# Patient Record
Sex: Female | Born: 1960 | Race: White | Hispanic: No | Marital: Married | Smoking: Former smoker
Health system: Southern US, Community
[De-identification: ages and names within clinical notes are randomized; demographics above are authoritative.]

## PROBLEM LIST (undated history)

## (undated) DIAGNOSIS — T7840XA Allergy, unspecified, initial encounter: Secondary | ICD-10-CM

## (undated) DIAGNOSIS — I10 Essential (primary) hypertension: Secondary | ICD-10-CM

## (undated) DIAGNOSIS — M1990 Unspecified osteoarthritis, unspecified site: Secondary | ICD-10-CM

## (undated) DIAGNOSIS — I714 Abdominal aortic aneurysm, without rupture, unspecified: Secondary | ICD-10-CM

## (undated) DIAGNOSIS — M810 Age-related osteoporosis without current pathological fracture: Secondary | ICD-10-CM

## (undated) DIAGNOSIS — I161 Hypertensive emergency: Secondary | ICD-10-CM

## (undated) DIAGNOSIS — O00109 Unspecified tubal pregnancy without intrauterine pregnancy: Secondary | ICD-10-CM

## (undated) DIAGNOSIS — O009 Unspecified ectopic pregnancy without intrauterine pregnancy: Secondary | ICD-10-CM

## (undated) DIAGNOSIS — E785 Hyperlipidemia, unspecified: Secondary | ICD-10-CM

## (undated) DIAGNOSIS — I712 Thoracic aortic aneurysm, without rupture: Secondary | ICD-10-CM

## (undated) HISTORY — PX: BREAST SURGERY: SHX581

## (undated) HISTORY — DX: Abdominal aortic aneurysm, without rupture, unspecified: I71.40

## (undated) HISTORY — DX: Age-related osteoporosis without current pathological fracture: M81.0

## (undated) HISTORY — DX: Hypertensive emergency: I16.1

## (undated) HISTORY — PX: PARTIAL HYSTERECTOMY: SHX80

## (undated) HISTORY — DX: Allergy, unspecified, initial encounter: T78.40XA

## (undated) HISTORY — DX: Essential (primary) hypertension: I10

## (undated) HISTORY — DX: Hyperlipidemia, unspecified: E78.5

## (undated) HISTORY — DX: Abdominal aortic aneurysm, without rupture: I71.4

## (undated) HISTORY — PX: ECTOPIC PREGNANCY SURGERY: SHX613

## (undated) HISTORY — DX: Unspecified osteoarthritis, unspecified site: M19.90

---
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
## 2005-05-30 ENCOUNTER — Emergency Department (HOSPITAL_COMMUNITY): Admission: EM | Admit: 2005-05-30 | Discharge: 2005-05-31 | Payer: Self-pay | Admitting: Emergency Medicine

## 2005-11-22 ENCOUNTER — Other Ambulatory Visit: Admission: RE | Admit: 2005-11-22 | Discharge: 2005-11-22 | Payer: Self-pay | Admitting: Obstetrics and Gynecology

## 2005-12-06 ENCOUNTER — Encounter: Admission: RE | Admit: 2005-12-06 | Discharge: 2005-12-06 | Payer: Self-pay | Admitting: Obstetrics and Gynecology

## 2010-03-28 ENCOUNTER — Encounter: Admission: RE | Admit: 2010-03-28 | Discharge: 2010-03-28 | Payer: Self-pay | Admitting: Obstetrics and Gynecology

## 2014-05-13 ENCOUNTER — Emergency Department (HOSPITAL_COMMUNITY)
Admission: EM | Admit: 2014-05-13 | Discharge: 2014-05-14 | Disposition: A | Discharge status: Home / Self Care | Payer: Managed Care, Other (non HMO) | Attending: Emergency Medicine | Admitting: Emergency Medicine

## 2014-05-13 ENCOUNTER — Encounter (HOSPITAL_COMMUNITY): Payer: Self-pay | Admitting: Emergency Medicine

## 2014-05-13 DIAGNOSIS — M79609 Pain in unspecified limb: Secondary | ICD-10-CM | POA: Diagnosis present

## 2014-05-13 DIAGNOSIS — F17200 Nicotine dependence, unspecified, uncomplicated: Secondary | ICD-10-CM | POA: Diagnosis not present

## 2014-05-13 DIAGNOSIS — Z880 Allergy status to penicillin: Secondary | ICD-10-CM | POA: Insufficient documentation

## 2014-05-13 DIAGNOSIS — M542 Cervicalgia: Secondary | ICD-10-CM | POA: Insufficient documentation

## 2014-05-13 DIAGNOSIS — M25512 Pain in left shoulder: Secondary | ICD-10-CM

## 2014-05-13 DIAGNOSIS — M25519 Pain in unspecified shoulder: Secondary | ICD-10-CM | POA: Insufficient documentation

## 2014-05-13 HISTORY — DX: Unspecified ectopic pregnancy without intrauterine pregnancy: O00.90

## 2014-05-13 LAB — CBC
HEMATOCRIT: 40.1 % (ref 36.0–46.0)
HEMOGLOBIN: 14.3 g/dL (ref 12.0–15.0)
MCH: 31 pg (ref 26.0–34.0)
MCHC: 35.7 g/dL (ref 30.0–36.0)
MCV: 86.8 fL (ref 78.0–100.0)
Platelets: 256 10*3/uL (ref 150–400)
RBC: 4.62 MIL/uL (ref 3.87–5.11)
RDW: 13 % (ref 11.5–15.5)
WBC: 13.2 10*3/uL — AB (ref 4.0–10.5)

## 2014-05-13 LAB — BASIC METABOLIC PANEL
ANION GAP: 13 (ref 5–15)
BUN: 19 mg/dL (ref 6–23)
CALCIUM: 9.5 mg/dL (ref 8.4–10.5)
CO2: 24 meq/L (ref 19–32)
CREATININE: 1.7 mg/dL — AB (ref 0.50–1.10)
Chloride: 102 mEq/L (ref 96–112)
GFR calc Af Amer: 38 mL/min — ABNORMAL LOW (ref >90)
GFR calc non Af Amer: 33 mL/min — ABNORMAL LOW (ref >90)
GLUCOSE: 109 mg/dL — AB (ref 70–99)
Potassium: 4.1 mEq/L (ref 3.7–5.3)
Sodium: 139 mEq/L (ref 137–147)

## 2014-05-13 LAB — I-STAT TROPONIN, ED: TROPONIN I, POC: 0 ng/mL (ref 0.00–0.08)

## 2014-05-13 MED ORDER — KETOROLAC TROMETHAMINE 60 MG/2ML IM SOLN
60.0000 mg | Freq: Once | INTRAMUSCULAR | Status: AC
  Administered 2014-05-13: 60 mg via INTRAMUSCULAR
  Filled 2014-05-13: qty 2

## 2014-05-13 MED ORDER — OXYCODONE-ACETAMINOPHEN 5-325 MG PO TABS
1.0000 | ORAL_TABLET | Freq: Once | ORAL | Status: AC
  Administered 2014-05-13: 1 via ORAL
  Filled 2014-05-13: qty 1

## 2014-05-13 MED ORDER — HYDROMORPHONE HCL PF 1 MG/ML IJ SOLN
0.5000 mg | Freq: Once | INTRAMUSCULAR | Status: AC
Start: 2014-05-13 — End: 2014-05-13
  Administered 2014-05-13: 0.5 mg via INTRAMUSCULAR
  Filled 2014-05-13: qty 1

## 2014-05-13 NOTE — ED Provider Notes (Signed)
CSN: [id number]     Arrival date & time 05/13/14  2152 History   First MD Initiated Contact with Patient 05/13/14 2258     Chief Complaint  Patient presents with  . Arm Pain  . Neck Pain     (Consider location/radiation/quality/duration/timing/severity/associated sxs/prior Treatment) Patient is a 53 y.o. female presenting with arm pain and neck pain. The history is provided by the patient.  Arm Pain This is a new problem. Associated symptoms include chest pain. Pertinent negatives include no abdominal pain, no headaches and no shortness of breath.  Neck Pain Associated symptoms: chest pain   Associated symptoms: no fever, no headaches, no numbness and no weakness    patient woke with left shoulder pain this morning. It is from her shoulder down to her whole upper trunk. She states the arm feels somewhat numb. She has enough pain that it is difficult to move. No fevers. No trauma. She states the pain starts from her neck and works its way down. There is some slight radiation to her chest. She states that she had a ceiling yesterday. She's not had pain like this before. No shortness of breath. Some mild nausea due to the pain.  Past Medical History  Diagnosis Date  . Ectopic pregnancy    Past Surgical History  Procedure Laterality Date  . Breast surgery    . Partial hysterectomy     History reviewed. No pertinent family history. History  Substance Use Topics  . Smoking status: Current Every Day Smoker  . Smokeless tobacco: Not on file  . Alcohol Use: No   OB History   Grav Para Term Preterm Abortions TAB SAB Ect Mult Living                 Review of Systems  Constitutional: Negative for fever, activity change and appetite change.  Eyes: Negative for pain.  Respiratory: Negative for chest tightness and shortness of breath.   Cardiovascular: Positive for chest pain. Negative for leg swelling.  Gastrointestinal: Negative for nausea, vomiting, abdominal pain and diarrhea.   Genitourinary: Negative for flank pain.  Musculoskeletal: Positive for neck pain. Negative for back pain, joint swelling, myalgias and neck stiffness.  Skin: Negative for rash and wound.  Neurological: Negative for weakness, numbness and headaches.  Psychiatric/Behavioral: Negative for behavioral problems.      Allergies  Penicillins  Home Medications   Prior to Admission medications   Medication Sig Start Date End Date Taking? Authorizing Provider  aspirin-acetaminophen-caffeine (EXCEDRIN MIGRAINE) [phone] MG per tablet Take 1 tablet by mouth every 6 (six) hours as needed for headache.   Yes Historical Provider, MD  ibuprofen (ADVIL,MOTRIN) 600 MG tablet Take 1 tablet (600 mg total) by mouth every 6 (six) hours as needed. 05/14/14   Juliet Rude. Chantele Corado, MD  oxyCODONE-acetaminophen (PERCOCET/ROXICET) 5-325 MG per tablet Take 1-2 tablets by mouth every 6 (six) hours as needed. 05/14/14   Juliet Rude. Dajon Lazar, MD   BP 136/72  Pulse 58  Temp(Src) 98.4 F (36.9 C) (Oral)  Resp 19  Ht  (1.676 m)  Wt 131 lb (59.421 kg)  BMI 21.15 kg/m2  SpO2 99% Physical Exam  Constitutional: She is oriented to person, place, and time. She appears well-developed and well-nourished.  HENT:  Head: Normocephalic.  Cardiovascular: Normal rate and regular rhythm.   Pulmonary/Chest: Effort normal and breath sounds normal.  Abdominal: Soft.  Musculoskeletal: She exhibits tenderness.  Moderate tenderness to left shoulder. No erythema. Pain with movement both active and  passive shoulder. Some pain in the shoulder with movement of the elbow. Strong radial pulse. Patient states paresthesias over her entire hand. Sensation is otherwise intact. Good radial median and ulnar strength. No tenderness of her neck.  Neurological: She is alert and oriented to person, place, and time.  Skin: Skin is warm. No rash noted.    ED Course  Procedures (including critical care time) Labs Review Labs Reviewed  CBC -  Abnormal; Notable for the following:    WBC 13.2 (*)    All other components within normal limits  BASIC METABOLIC PANEL - Abnormal; Notable for the following:    Glucose, Bld 109 (*)    Creatinine, Ser 1.70 (*)    GFR calc non Af Amer 33 (*)    GFR calc Af Amer 38 (*)    All other components within normal limits  I-STAT TROPOININ, ED    Imaging Review Dg Shoulder Left  05/14/2014   CLINICAL DATA:  Left arm pain starting this morning. Arm is in a man heavy to lift. No injury.  EXAM: LEFT SHOULDER - 2+ VIEW  COMPARISON:  None.  FINDINGS: There is no evidence of fracture or dislocation. There is no evidence of arthropathy or other focal bone abnormality. Soft tissues are unremarkable.  IMPRESSION: Negative.   Electronically Signed   By: Burman Nieves M.D.   On: 05/14/2014 00:57     EKG Interpretation   Date/Time:  Friday May 13 2014 22:00:55 EDT Ventricular Rate:  73 PR Interval:  184 QRS Duration: 80 QT Interval:  396 QTC Calculation: 436 R Axis:   16 Text Interpretation:  Normal sinus rhythm Low voltage QRS Cannot rule out  Anteroseptal infarct , age undetermined Abnormal ECG Confirmed by  Rubin Payor  MD, Jersie Beel [phone]) on 05/14/2014 12:01:03 AM      MDM   Final diagnoses:  Shoulder pain, acute, left    Patient pain in left shoulder and upper extremity. Good pulses. No rash. Did recently in her ceiling and this could be related to that. Feels somewhat better after treatment. Does not have risk factors for septic joint. Will discharge home and will follow with ortho as needed    Juliet Rude. Rubin Payor, MD 05/14/14 [phone]

## 2014-05-13 NOTE — ED Notes (Signed)
Pt here with left arm pain that started this morning and has progressively gotten worse.  Pt sts pain got worse at 6 pm.  Pain starts in left arm and radiates into neck.  Pt denies neck being stiff and fevers at home.  Pt reports some nausea with pain.  Denies any shortness of breath, weakness, lightheadedness and confusion.  Pt sts arm currently feels numb and heavy to lift.  Able to feel this RN touch hand.  Pain currently 8/10.

## 2014-05-14 ENCOUNTER — Emergency Department (HOSPITAL_COMMUNITY): Payer: Managed Care, Other (non HMO)

## 2014-05-14 MED ORDER — IBUPROFEN 600 MG PO TABS: 600.0000 mg | ORAL_TABLET | Freq: Four times a day (QID) | ORAL | Status: DC | PRN

## 2014-05-14 MED ORDER — OXYCODONE-ACETAMINOPHEN 5-325 MG PO TABS: 1.0000 | ORAL_TABLET | Freq: Four times a day (QID) | ORAL | Status: DC | PRN

## 2014-05-14 NOTE — Discharge Instructions (Signed)

## 2014-05-14 NOTE — ED Notes (Signed)
Applied to left arm

## 2019-01-18 ENCOUNTER — Inpatient Hospital Stay (HOSPITAL_COMMUNITY)
Admission: EM | Admit: 2019-01-18 | Discharge: 2019-01-25 | DRG: 300 | Disposition: A | Discharge status: Home / Self Care | Payer: 59 | Attending: Vascular Surgery | Admitting: Vascular Surgery

## 2019-01-18 ENCOUNTER — Encounter (HOSPITAL_COMMUNITY): Payer: Self-pay | Admitting: Emergency Medicine

## 2019-01-18 ENCOUNTER — Emergency Department (HOSPITAL_COMMUNITY): Payer: 59

## 2019-01-18 ENCOUNTER — Other Ambulatory Visit: Payer: Self-pay

## 2019-01-18 DIAGNOSIS — I7103 Dissection of thoracoabdominal aorta: Principal | ICD-10-CM | POA: Diagnosis present

## 2019-01-18 DIAGNOSIS — R7989 Other specified abnormal findings of blood chemistry: Secondary | ICD-10-CM | POA: Diagnosis present

## 2019-01-18 DIAGNOSIS — I161 Hypertensive emergency: Secondary | ICD-10-CM | POA: Diagnosis present

## 2019-01-18 DIAGNOSIS — R0781 Pleurodynia: Secondary | ICD-10-CM

## 2019-01-18 DIAGNOSIS — N179 Acute kidney failure, unspecified: Secondary | ICD-10-CM | POA: Diagnosis present

## 2019-01-18 DIAGNOSIS — F17210 Nicotine dependence, cigarettes, uncomplicated: Secondary | ICD-10-CM | POA: Diagnosis present

## 2019-01-18 DIAGNOSIS — I71019 Dissection of thoracic aorta, unspecified: Secondary | ICD-10-CM

## 2019-01-18 DIAGNOSIS — Z20828 Contact with and (suspected) exposure to other viral communicable diseases: Secondary | ICD-10-CM | POA: Diagnosis present

## 2019-01-18 DIAGNOSIS — I701 Atherosclerosis of renal artery: Secondary | ICD-10-CM | POA: Diagnosis present

## 2019-01-18 DIAGNOSIS — I7101 Dissection of thoracic aorta: Secondary | ICD-10-CM | POA: Diagnosis not present

## 2019-01-18 DIAGNOSIS — D72829 Elevated white blood cell count, unspecified: Secondary | ICD-10-CM | POA: Diagnosis present

## 2019-01-18 DIAGNOSIS — N183 Chronic kidney disease, stage 3 (moderate): Secondary | ICD-10-CM | POA: Diagnosis present

## 2019-01-18 DIAGNOSIS — N1339 Other hydronephrosis: Secondary | ICD-10-CM | POA: Diagnosis present

## 2019-01-18 DIAGNOSIS — Z23 Encounter for immunization: Secondary | ICD-10-CM

## 2019-01-18 DIAGNOSIS — I129 Hypertensive chronic kidney disease with stage 1 through stage 4 chronic kidney disease, or unspecified chronic kidney disease: Secondary | ICD-10-CM | POA: Diagnosis present

## 2019-01-18 DIAGNOSIS — T402X5A Adverse effect of other opioids, initial encounter: Secondary | ICD-10-CM | POA: Diagnosis present

## 2019-01-18 DIAGNOSIS — L299 Pruritus, unspecified: Secondary | ICD-10-CM | POA: Diagnosis not present

## 2019-01-18 DIAGNOSIS — R040 Epistaxis: Secondary | ICD-10-CM | POA: Diagnosis not present

## 2019-01-18 LAB — CBC WITH DIFFERENTIAL/PLATELET
Abs Immature Granulocytes: 0.05 10*3/uL (ref 0.00–0.07)
Basophils Absolute: 0.1 10*3/uL (ref 0.0–0.1)
Basophils Relative: 1 %
Eosinophils Absolute: 0.3 10*3/uL (ref 0.0–0.5)
Eosinophils Relative: 2 %
HCT: 39.1 % (ref 36.0–46.0)
Hemoglobin: 13.4 g/dL (ref 12.0–15.0)
Immature Granulocytes: 0 %
Lymphocytes Relative: 35 %
Lymphs Abs: 4.1 10*3/uL — ABNORMAL HIGH (ref 0.7–4.0)
MCH: 30 pg (ref 26.0–34.0)
MCHC: 34.3 g/dL (ref 30.0–36.0)
MCV: 87.7 fL (ref 80.0–100.0)
Monocytes Absolute: 1.1 10*3/uL — ABNORMAL HIGH (ref 0.1–1.0)
Monocytes Relative: 10 %
Neutro Abs: 6.1 10*3/uL (ref 1.7–7.7)
Neutrophils Relative %: 52 %
Platelets: 241 10*3/uL (ref 150–400)
RBC: 4.46 MIL/uL (ref 3.87–5.11)
RDW: 12.5 % (ref 11.5–15.5)
WBC: 11.6 10*3/uL — ABNORMAL HIGH (ref 4.0–10.5)
nRBC: 0 % (ref 0.0–0.2)

## 2019-01-18 LAB — I-STAT CREATININE, ED: Creatinine, Ser: 1.8 mg/dL — ABNORMAL HIGH (ref 0.44–1.00)

## 2019-01-18 MED ORDER — LABETALOL HCL 5 MG/ML IV SOLN
20.0000 mg | Freq: Once | INTRAVENOUS | Status: AC
  Administered 2019-01-18: 20 mg via INTRAVENOUS
  Filled 2019-01-18: qty 4

## 2019-01-18 MED ORDER — FENTANYL CITRATE (PF) 100 MCG/2ML IJ SOLN
50.0000 ug | Freq: Once | INTRAMUSCULAR | Status: AC
  Administered 2019-01-18: 50 ug via INTRAVENOUS
  Filled 2019-01-18: qty 2

## 2019-01-18 NOTE — ED Provider Notes (Signed)
[facility] Provider Note   CSN: [id number] Arrival date & time: 01/18/19  2323    History   Chief Complaint Chief Complaint  Patient presents with  . Chest Pain  . Back Pain  . Hypertension    HPI Chestine SporeKathryn Dessert is a 58 y.o. female.     Level 5 caveat for acuity of condition.  Patient presents from home with sudden onset mid back pain between her scapula that radiates down her back.  She has not had this pain in the past.  There is not been any falls or trauma.  States this pain came on while she was resting at home.  She mowed her lawn today without difficulty.  When EMS arrived she was complaining some chest pain and was found to be hypertensive at 190 systolic with no history of the same.  She denies any radiation of the pain to her legs.  There is no numbness or tingling.  No bowel or bladder incontinence.  No fever or vomiting.  Complains of shortness of breath and feels like she cannot get a big breath in.  There is been no cough or fever.  There is been no leg pain or leg swelling.  No history of any kind of blood clot.  She received fentanyl by EMS with partial relief of her pain.  The history is provided by the patient and the EMS personnel. The history is limited by the condition of the patient.  Chest Pain  Associated symptoms: back pain and shortness of breath   Associated symptoms: no abdominal pain, no dizziness, no headache, no nausea, no vomiting and no weakness   Back Pain  Associated symptoms: chest pain   Associated symptoms: no abdominal pain, no dysuria, no headaches and no weakness   Hypertension  Associated symptoms include chest pain and shortness of breath. Pertinent negatives include no abdominal pain and no headaches.    Past Medical History:  Diagnosis Date  . Ectopic pregnancy     There are no active problems to display for this patient.   Past Surgical History:  Procedure Laterality Date  . BREAST  SURGERY    . PARTIAL HYSTERECTOMY       OB History   No obstetric history on file.      Home Medications    Prior to Admission medications   Medication Sig Start Date End Date Taking? Authorizing Provider  aspirin-acetaminophen-caffeine (EXCEDRIN MIGRAINE) [phone] MG per tablet Take 1 tablet by mouth every 6 (six) hours as needed for headache.    [provider]  ibuprofen (ADVIL,MOTRIN) 600 MG tablet Take 1 tablet (600 mg total) by mouth every 6 (six) hours as needed. 05/14/14   Benjiman CorePickering, Nathan, MD  oxyCODONE-acetaminophen (PERCOCET/ROXICET) 5-325 MG per tablet Take 1-2 tablets by mouth every 6 (six) hours as needed. 05/14/14   Benjiman CorePickering, Nathan, MD    Family History No family history on file.  Social History Social History   Tobacco Use  . Smoking status: Current Every Day Smoker  Substance Use Topics  . Alcohol use: No  . Drug use: Not on file     Allergies   Penicillins   Review of Systems Review of Systems  HENT: Negative for congestion.   Eyes: Negative for visual disturbance.  Respiratory: Positive for chest tightness and shortness of breath.   Cardiovascular: Positive for chest pain.  Gastrointestinal: Negative for abdominal pain, nausea and vomiting.  Genitourinary: Negative for dysuria and hematuria.  Musculoskeletal:  Positive for back pain.  Skin: Negative for rash.  Neurological: Negative for dizziness, weakness and headaches.    all other systems are negative except as noted in the HPI and PMH.    Physical Exam Updated Vital Signs BP (!) 153/91   Pulse 64   Resp 16   Ht  (1.676 m)   Wt 63.5 kg   SpO2 93%   BMI 22.60 kg/m   Physical Exam Vitals signs and nursing note reviewed.  Constitutional:      General: She is not in acute distress.    Appearance: She is well-developed.     Comments: Uncomfortable  HENT:     Head: Normocephalic and atraumatic.     Mouth/Throat:     Pharynx: No oropharyngeal exudate.  Eyes:      Conjunctiva/sclera: Conjunctivae normal.     Pupils: Pupils are equal, round, and reactive to light.  Neck:     Musculoskeletal: Normal range of motion and neck supple.     Comments: No meningismus. Cardiovascular:     Rate and Rhythm: Normal rate and regular rhythm.     Heart sounds: Normal heart sounds. No murmur.     Comments: Equal radial pulses bilaterally, equal grip strengths  Equal DP and PT pulses Pulmonary:     Effort: Pulmonary effort is normal. No respiratory distress.     Breath sounds: Normal breath sounds.  Abdominal:     Palpations: Abdomen is soft.     Tenderness: There is no abdominal tenderness. There is no guarding or rebound.  Musculoskeletal: Normal range of motion.        General: Tenderness present.     Comments: Paraspinal thoracic and lumbar tenderness without midline tenderness.  Equal lower extremity strength , Great toe extension intact bilaterally, ankle flexion extension intact bilaterally.  Skin:    General: Skin is warm.     Capillary Refill: Capillary refill takes less than 2 seconds.  Neurological:     General: No focal deficit present.     Mental Status: She is alert and oriented to person, place, and time. Mental status is at baseline.     Cranial Nerves: No cranial nerve deficit.     Motor: No abnormal muscle tone.     Coordination: Coordination normal.     Comments:  5/5 strength throughout. CN 2-12 intact.Equal grip strength.   Psychiatric:        Behavior: Behavior normal.      ED Treatments / Results  Labs (all labs ordered are listed, but only abnormal results are displayed) Labs Reviewed  CBC WITH DIFFERENTIAL/PLATELET - Abnormal; Notable for the following components:      Result Value   WBC 11.6 (*)    Lymphs Abs 4.1 (*)    Monocytes Absolute 1.1 (*)    All other components within normal limits  COMPREHENSIVE METABOLIC PANEL - Abnormal; Notable for the following components:   CO2 18 (*)    Glucose, Bld 141 (*)    BUN 25  (*)    Creatinine, Ser 1.94 (*)    AST 42 (*)    GFR calc non Af Amer 28 (*)    GFR calc Af Amer 32 (*)    All other components within normal limits  TROPONIN I - Abnormal; Notable for the following components:   Troponin I 0.03 (*)    All other components within normal limits  D-DIMER, QUANTITATIVE (NOT AT [facility]) - Abnormal; Notable for the following components:  D-Dimer, Quant >20.00 (*)    All other components within normal limits  I-STAT CREATININE, ED - Abnormal; Notable for the following components:   Creatinine, Ser 1.80 (*)    All other components within normal limits  SARS CORONAVIRUS 2 (HOSPITAL ORDER, PERFORMED IN [facility])  SURGICAL PCR SCREEN  BRAIN NATRIURETIC PEPTIDE  HIV ANTIBODY (ROUTINE TESTING W REFLEX)  CBC  COMPREHENSIVE METABOLIC PANEL  HEMOGLOBIN A1C    EKG EKG Interpretation  Date/Time:  Monday Jan 18 2019 23:33:34 EDT Ventricular Rate:  87 PR Interval:    QRS Duration: 98 QT Interval:  379 QTC Calculation: 456 R Axis:   3 Text Interpretation:  Sinus rhythm Probable anterior infarct, age indeterminate Baseline wander in lead(s) II III aVR aVF V2 V3 V4 V5 V6 Interpretation limited secondary to artifact No significant change was found Confirmed by Glynn Octave [phone]) on 01/18/2019 11:44:47 PM   Radiology Dg Chest Portable 1 View  Result Date: 01/18/2019 CLINICAL DATA:  Chest pain and shortness of breath EXAM: PORTABLE CHEST 1 VIEW COMPARISON:  05/30/2005 FINDINGS: The heart size and mediastinal contours are within normal limits. Both lungs are clear. The visualized skeletal structures are unremarkable. IMPRESSION: No active disease. Electronically Signed   By: Alcide Clever M.D.   On: 01/18/2019 23:41   Ct Angio Chest/abd/pel For Dissection W And/or Wo Contrast  Result Date: 01/19/2019 CLINICAL DATA:  Chest pain, back pain, shortness of breath. EXAM: CT ANGIOGRAPHY CHEST, ABDOMEN AND PELVIS TECHNIQUE: Multidetector CT imaging through  the chest, abdomen and pelvis was performed using the standard protocol during bolus administration of intravenous contrast. Multiplanar reconstructed images and MIPs were obtained and reviewed to evaluate the vascular anatomy. CONTRAST:  OMNIPAQUE IOHEXOL 350 MG/ML SOLN COMPARISON:  None. FINDINGS: CTA CHEST FINDINGS Cardiovascular: There is a type B aortic dissection beginning just beyond the origin of the left subclavian artery. The false lumen within the chest is predominantly thrombosed. Great vessels are patent and uninvolved. Aortic atherosclerosis. Ascending thoracic aorta normal caliber and uninvolved. Heart is normal size. Mediastinum/Nodes: No mediastinal, hilar, or axillary adenopathy. Lungs/Pleura: Lungs are clear. No focal airspace opacities or suspicious nodules. No effusions. Musculoskeletal: Chest wall soft tissues are unremarkable. No acute bony abnormality. Review of the MIP images confirms the above findings. CTA ABDOMEN AND PELVIS FINDINGS VASCULAR Aorta: The dissection continues through the abdominal aorta with contrast opacification noted in the false lumen within the abdomen. The celiac artery and superior mesenteric artery arise from the true lumen. The inferior mesenteric artery arises from the false lumen. All are patent. Both renal arteries appear to arise from the true lumen. 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 Celiac: Patent, arising from the true lumen. SMA: Patent, arising from the true lumen. Renals: Patent, arising from the true lumen. IMA: Patent, arising from the false lumen. Inflow: Dissection continues into both common iliac arteries, terminating on the left above the common iliac bifurcation. This continues on the right into the proximal right external iliac artery. Iliofemoral vessels are patent. There is abnormal soft tissue surrounding the distal left common iliac artery and proximal left external and internal iliac arteries. I see no  contrast extravasation, but cannot exclude leak. Veins: No obvious venous abnormality within the limitations of this arterial phase study. Review of the MIP images confirms the above findings. NON-VASCULAR Hepatobiliary: No focal hepatic abnormality. Gallbladder unremarkable. Pancreas: No focal abnormality or ductal dilatation. Spleen: No focal abnormality.  Normal size. Adrenals/Urinary Tract:  There is left hydronephrosis and delayed excretion of contrast from the left kidney. The left ureter is dilated to the pelvic brim and the abnormal soft tissue surrounding the left iliac vessels. No visible stones. Adrenal glands and urinary bladder unremarkable. Right kidney unremarkable. Stomach/Bowel: Stomach, large and small bowel grossly unremarkable. Lymphatic: No adenopathy Reproductive: Uterus and adnexa unremarkable.  No mass. Other: No free fluid or free air. Musculoskeletal: No acute bony abnormality. Review of the MIP images confirms the above findings. IMPRESSION: Type B thoracoabdominal aortic dissection. False lumen is thrombosed in the chest, patent in the abdomen, and extends into the right external iliac artery and left common iliac artery. Great vessels are patent and uninvolved. Abnormal soft tissue surrounds the left iliac vessels and causes obstruction of the left kidney. Cannot exclude leak from the iliac vessel although no active extravasation of contrast is seen. As mentioned above, left hydronephrosis and delayed excretion of contrast from the left kidney. Critical Value/emergent results were called by telephone at the time of interpretation on 01/19/2019 at 12:40 am to Dr. Glynn Octave , who verbally acknowledged these results. Electronically Signed   By: Charlett Nose M.D.   On: 01/19/2019 00:40    Procedures Procedures (including critical care time)  Medications Ordered in ED Medications  fentaNYL (SUBLIMAZE) injection 50 mcg (has no administration in time range)  labetalol (NORMODYNE)  injection 20 mg (has no administration in time range)     Initial Impression / Assessment and Plan / ED Course  I have reviewed the triage vital signs and the nursing notes.  Pertinent labs & imaging results that were available during my care of the patient were reviewed by me and considered in my medical decision making (see chart for details).       Sudden onset of mid back pain and chest pain with shortness of breath and concern for aortic dissection.  She has equal radial pulses and grip strengths.  Equal peripheral pulses in her feet.  Creatinine 1.8 which is near her baseline.  She seems appropriate for low-dose IV contrast.  Benefits of IV contrast in diagnosing her dissection outweigh risks of post contrast nephropathy.  CT confirms type B aortic dissection involving iliac artery as well as occlusion of left kidney.  Patient started on IV labetalol and IV esmolol for aggressive blood pressure control.  Discussed with Dr. Darrick Penna of vascular surgery who will evaluate. Discussed with Dr. Dorris Fetch of cardiothoracic surgery who defers to Dr. Darrick Penna.  Patient continued on IV labetalol and IV esmolol as well as IV nitroglycerin for blood pressure control.  Discussed target systolic blood pressure of 140 with Dr. Darrick Penna.  Patient's chest pain and back pain have improved.  She is maintaining her airway mental status is stable. Admission discussed with Dr. Darrick Penna as well as ICU team.  Patient's husband updated by phone at her request.   CRITICAL CARE Performed by: Glynn Octave Total critical care time: 60 minutes Critical care time was exclusive of separately billable procedures and treating other patients. Critical care was necessary to treat or prevent imminent or life-threatening deterioration. Critical care was time spent personally by me on the following activities: development of treatment plan with patient and/or surrogate as well as nursing, discussions with consultants,  evaluation of patient's response to treatment, examination of patient, obtaining history from patient or surrogate, ordering and performing treatments and interventions, ordering and review of laboratory studies, ordering and review of radiographic studies, pulse oximetry and re-evaluation of patient's condition.  Final Clinical Impressions(s) / ED Diagnoses   Final diagnoses:  Aortic dissection, thoracic [facility])  Hypertensive emergency    ED Discharge Orders    None       Taha Dimond, Jeannett Senior, MD 01/19/19 [phone]

## 2019-01-18 NOTE — ED Triage Notes (Signed)
BIB EMS from home. C/o back pain, mid to lower, sudden onset approx 2200 tonight. Also c/o CP, SOB. 99% RA with clear LS, but tachypnic. States unable to get a deep breath, though doing just that upon presentation to ED. Hypertensive throughout. Given fentanyl.

## 2019-01-18 NOTE — ED Notes (Signed)
Patient transported to CT, escorted by this RN. °

## 2019-01-19 ENCOUNTER — Inpatient Hospital Stay (HOSPITAL_COMMUNITY): Payer: 59

## 2019-01-19 ENCOUNTER — Emergency Department (HOSPITAL_COMMUNITY): Payer: 59

## 2019-01-19 DIAGNOSIS — I129 Hypertensive chronic kidney disease with stage 1 through stage 4 chronic kidney disease, or unspecified chronic kidney disease: Secondary | ICD-10-CM | POA: Diagnosis present

## 2019-01-19 DIAGNOSIS — N183 Chronic kidney disease, stage 3 (moderate): Secondary | ICD-10-CM | POA: Diagnosis not present

## 2019-01-19 DIAGNOSIS — I7101 Dissection of thoracic aorta: Secondary | ICD-10-CM | POA: Diagnosis present

## 2019-01-19 DIAGNOSIS — N179 Acute kidney failure, unspecified: Secondary | ICD-10-CM | POA: Diagnosis present

## 2019-01-19 DIAGNOSIS — I161 Hypertensive emergency: Secondary | ICD-10-CM

## 2019-01-19 DIAGNOSIS — I7102 Dissection of abdominal aorta: Secondary | ICD-10-CM

## 2019-01-19 DIAGNOSIS — T402X5A Adverse effect of other opioids, initial encounter: Secondary | ICD-10-CM | POA: Diagnosis present

## 2019-01-19 DIAGNOSIS — Z01810 Encounter for preprocedural cardiovascular examination: Secondary | ICD-10-CM

## 2019-01-19 DIAGNOSIS — I701 Atherosclerosis of renal artery: Secondary | ICD-10-CM | POA: Diagnosis present

## 2019-01-19 DIAGNOSIS — Z23 Encounter for immunization: Secondary | ICD-10-CM | POA: Diagnosis not present

## 2019-01-19 DIAGNOSIS — I71019 Dissection of thoracic aorta, unspecified: Secondary | ICD-10-CM | POA: Diagnosis present

## 2019-01-19 DIAGNOSIS — N1339 Other hydronephrosis: Secondary | ICD-10-CM | POA: Diagnosis present

## 2019-01-19 DIAGNOSIS — R9431 Abnormal electrocardiogram [ECG] [EKG]: Secondary | ICD-10-CM | POA: Diagnosis not present

## 2019-01-19 DIAGNOSIS — D72829 Elevated white blood cell count, unspecified: Secondary | ICD-10-CM | POA: Diagnosis present

## 2019-01-19 DIAGNOSIS — F17210 Nicotine dependence, cigarettes, uncomplicated: Secondary | ICD-10-CM | POA: Diagnosis present

## 2019-01-19 DIAGNOSIS — Z20828 Contact with and (suspected) exposure to other viral communicable diseases: Secondary | ICD-10-CM | POA: Diagnosis present

## 2019-01-19 DIAGNOSIS — R079 Chest pain, unspecified: Secondary | ICD-10-CM | POA: Diagnosis not present

## 2019-01-19 DIAGNOSIS — R040 Epistaxis: Secondary | ICD-10-CM | POA: Diagnosis not present

## 2019-01-19 DIAGNOSIS — I7103 Dissection of thoracoabdominal aorta: Secondary | ICD-10-CM | POA: Diagnosis present

## 2019-01-19 DIAGNOSIS — L299 Pruritus, unspecified: Secondary | ICD-10-CM | POA: Diagnosis not present

## 2019-01-19 DIAGNOSIS — R7989 Other specified abnormal findings of blood chemistry: Secondary | ICD-10-CM | POA: Diagnosis present

## 2019-01-19 LAB — BASIC METABOLIC PANEL
Anion gap: 7 (ref 5–15)
BUN: 28 mg/dL — ABNORMAL HIGH (ref 6–20)
CO2: 21 mmol/L — ABNORMAL LOW (ref 22–32)
Calcium: 8.6 mg/dL — ABNORMAL LOW (ref 8.9–10.3)
Chloride: 107 mmol/L (ref 98–111)
Creatinine, Ser: 2.14 mg/dL — ABNORMAL HIGH (ref 0.44–1.00)
GFR calc Af Amer: 28 mL/min — ABNORMAL LOW (ref >60)
GFR calc non Af Amer: 25 mL/min — ABNORMAL LOW (ref >60)
Glucose, Bld: 139 mg/dL — ABNORMAL HIGH (ref 70–99)
Potassium: 4.1 mmol/L (ref 3.5–5.1)
Sodium: 135 mmol/L (ref 135–145)

## 2019-01-19 LAB — COMPREHENSIVE METABOLIC PANEL
ALT: 27 U/L (ref 0–44)
ALT: 31 U/L (ref 0–44)
AST: 35 U/L (ref 15–41)
AST: 42 U/L — ABNORMAL HIGH (ref 15–41)
Albumin: 3.1 g/dL — ABNORMAL LOW (ref 3.5–5.0)
Albumin: 3.7 g/dL (ref 3.5–5.0)
Alkaline Phosphatase: 73 U/L (ref 38–126)
Alkaline Phosphatase: 86 U/L (ref 38–126)
Anion gap: 12 (ref 5–15)
Anion gap: 15 (ref 5–15)
BUN: 25 mg/dL — ABNORMAL HIGH (ref 6–20)
BUN: 26 mg/dL — ABNORMAL HIGH (ref 6–20)
CO2: 18 mmol/L — ABNORMAL LOW (ref 22–32)
CO2: 21 mmol/L — ABNORMAL LOW (ref 22–32)
Calcium: 8.7 mg/dL — ABNORMAL LOW (ref 8.9–10.3)
Calcium: 9.3 mg/dL (ref 8.9–10.3)
Chloride: 105 mmol/L (ref 98–111)
Chloride: 107 mmol/L (ref 98–111)
Creatinine, Ser: 1.94 mg/dL — ABNORMAL HIGH (ref 0.44–1.00)
Creatinine, Ser: 2.02 mg/dL — ABNORMAL HIGH (ref 0.44–1.00)
GFR calc Af Amer: 31 mL/min — ABNORMAL LOW (ref >60)
GFR calc Af Amer: 32 mL/min — ABNORMAL LOW (ref >60)
GFR calc non Af Amer: 26 mL/min — ABNORMAL LOW (ref >60)
GFR calc non Af Amer: 28 mL/min — ABNORMAL LOW (ref >60)
Glucose, Bld: 141 mg/dL — ABNORMAL HIGH (ref 70–99)
Glucose, Bld: 192 mg/dL — ABNORMAL HIGH (ref 70–99)
Potassium: 3.6 mmol/L (ref 3.5–5.1)
Potassium: 4.4 mmol/L (ref 3.5–5.1)
Sodium: 138 mmol/L (ref 135–145)
Sodium: 140 mmol/L (ref 135–145)
Total Bilirubin: 0.6 mg/dL (ref 0.3–1.2)
Total Bilirubin: 0.8 mg/dL (ref 0.3–1.2)
Total Protein: 6.1 g/dL — ABNORMAL LOW (ref 6.5–8.1)
Total Protein: 7.3 g/dL (ref 6.5–8.1)

## 2019-01-19 LAB — TROPONIN I
Troponin I: 0.03 ng/mL (ref <0.03)
Troponin I: 0.08 ng/mL (ref <0.03)
Troponin I: 0.09 ng/mL (ref <0.03)

## 2019-01-19 LAB — CBC
HCT: 35.3 % — ABNORMAL LOW (ref 36.0–46.0)
Hemoglobin: 11.8 g/dL — ABNORMAL LOW (ref 12.0–15.0)
MCH: 30 pg (ref 26.0–34.0)
MCHC: 33.4 g/dL (ref 30.0–36.0)
MCV: 89.8 fL (ref 80.0–100.0)
Platelets: 206 10*3/uL (ref 150–400)
RBC: 3.93 MIL/uL (ref 3.87–5.11)
RDW: 12.5 % (ref 11.5–15.5)
WBC: 12 10*3/uL — ABNORMAL HIGH (ref 4.0–10.5)
nRBC: 0 % (ref 0.0–0.2)

## 2019-01-19 LAB — BRAIN NATRIURETIC PEPTIDE: B Natriuretic Peptide: 71.7 pg/mL (ref 0.0–100.0)

## 2019-01-19 LAB — MAGNESIUM: Magnesium: 1.9 mg/dL (ref 1.7–2.4)

## 2019-01-19 LAB — HEMOGLOBIN A1C
Hgb A1c MFr Bld: 5.6 % (ref 4.8–5.6)
Mean Plasma Glucose: 114.02 mg/dL

## 2019-01-19 LAB — SURGICAL PCR SCREEN
MRSA, PCR: NEGATIVE
Staphylococcus aureus: POSITIVE — AB

## 2019-01-19 LAB — SARS CORONAVIRUS 2 BY RT PCR (HOSPITAL ORDER, PERFORMED IN ~~LOC~~ HOSPITAL LAB): SARS Coronavirus 2: NEGATIVE

## 2019-01-19 LAB — D-DIMER, QUANTITATIVE: D-Dimer, Quant: >20 ug/mL-FEU — ABNORMAL HIGH (ref 0.00–0.50)

## 2019-01-19 LAB — HIV ANTIBODY (ROUTINE TESTING W REFLEX): HIV Screen 4th Generation wRfx: NONREACTIVE

## 2019-01-19 MED ORDER — AMLODIPINE BESYLATE 5 MG PO TABS: 5.0000 mg | ORAL_TABLET | Freq: Every day | ORAL | Status: DC

## 2019-01-19 MED ORDER — GUAIFENESIN-DM 100-10 MG/5ML PO SYRP
15.0000 mL | ORAL_SOLUTION | ORAL | Status: DC | PRN
  Administered 2019-01-22: 15 mL via ORAL
  Filled 2019-01-19: qty 15

## 2019-01-19 MED ORDER — POTASSIUM CHLORIDE CRYS ER 20 MEQ PO TBCR
20.0000 meq | EXTENDED_RELEASE_TABLET | Freq: Once | ORAL | Status: AC
  Administered 2019-01-19: 20 meq via ORAL
  Filled 2019-01-19: qty 1

## 2019-01-19 MED ORDER — ACETAMINOPHEN 325 MG RE SUPP: 325.0000 mg | RECTAL | Status: DC | PRN

## 2019-01-19 MED ORDER — DEXTROSE-NACL 5-0.45 % IV SOLN
INTRAVENOUS | Status: DC
  Administered 2019-01-19 – 2019-01-22 (×12): via INTRAVENOUS

## 2019-01-19 MED ORDER — LABETALOL HCL 5 MG/ML IV SOLN
10.0000 mg | INTRAVENOUS | Status: AC | PRN
  Administered 2019-01-19 – 2019-01-24 (×4): 10 mg via INTRAVENOUS
  Filled 2019-01-19 (×6): qty 4

## 2019-01-19 MED ORDER — LABETALOL HCL 5 MG/ML IV SOLN
0.5000 mg/min | INTRAVENOUS | Status: DC
  Administered 2019-01-19: 10:00:00 2 mg/min via INTRAVENOUS
  Administered 2019-01-19 (×2): 1.5 mg/min via INTRAVENOUS
  Administered 2019-01-19: 0.5 mg/min via INTRAVENOUS
  Administered 2019-01-19: 14:00:00 2 mg/min via INTRAVENOUS
  Administered 2019-01-20: 2.5 mg/min via INTRAVENOUS
  Administered 2019-01-20: 1.5 mg/min via INTRAVENOUS
  Administered 2019-01-20: 2 mg/min via INTRAVENOUS
  Administered 2019-01-21: 0.5 mg/min via INTRAVENOUS
  Administered 2019-01-21: 06:00:00 1 mg/min via INTRAVENOUS
  Filled 2019-01-19: qty 80
  Filled 2019-01-19 (×3): qty 20
  Filled 2019-01-19 (×2): qty 100
  Filled 2019-01-19: qty 80
  Filled 2019-01-19: qty 100
  Filled 2019-01-19: qty 80
  Filled 2019-01-19 (×4): qty 100

## 2019-01-19 MED ORDER — LABETALOL HCL 5 MG/ML IV SOLN
40.0000 mg | Freq: Once | INTRAVENOUS | Status: AC
  Administered 2019-01-19: 01:00:00 40 mg via INTRAVENOUS

## 2019-01-19 MED ORDER — PANTOPRAZOLE SODIUM 40 MG PO TBEC: 40.0000 mg | DELAYED_RELEASE_TABLET | Freq: Every day | ORAL | Status: DC

## 2019-01-19 MED ORDER — IOHEXOL 350 MG/ML SOLN
100.0000 mL | Freq: Once | INTRAVENOUS | Status: AC | PRN
  Administered 2019-01-19: 100 mL via INTRAVENOUS

## 2019-01-19 MED ORDER — FENTANYL CITRATE (PF) 100 MCG/2ML IJ SOLN
50.0000 ug | Freq: Once | INTRAMUSCULAR | Status: AC
  Administered 2019-01-19: 50 ug via INTRAVENOUS
  Filled 2019-01-19: qty 2

## 2019-01-19 MED ORDER — FENTANYL CITRATE (PF) 100 MCG/2ML IJ SOLN
50.0000 ug | Freq: Once | INTRAMUSCULAR | Status: AC
  Administered 2019-01-19: 50 ug via INTRAVENOUS

## 2019-01-19 MED ORDER — DOCUSATE SODIUM 100 MG PO CAPS
100.0000 mg | ORAL_CAPSULE | Freq: Two times a day (BID) | ORAL | Status: DC
  Administered 2019-01-19 – 2019-01-25 (×8): 100 mg via ORAL
  Filled 2019-01-19 (×10): qty 1

## 2019-01-19 MED ORDER — ACETAMINOPHEN 325 MG PO TABS
325.0000 mg | ORAL_TABLET | ORAL | Status: DC | PRN
  Administered 2019-01-19 – 2019-01-22 (×8): 650 mg via ORAL
  Filled 2019-01-19 (×8): qty 2

## 2019-01-19 MED ORDER — NITROGLYCERIN IN D5W 200-5 MCG/ML-% IV SOLN
0.0000 ug/min | INTRAVENOUS | Status: DC
  Administered 2019-01-19: 01:00:00 5 ug/min via INTRAVENOUS
  Filled 2019-01-19: qty 250

## 2019-01-19 MED ORDER — PNEUMOCOCCAL VAC POLYVALENT 25 MCG/0.5ML IJ INJ
0.5000 mL | INJECTION | INTRAMUSCULAR | Status: AC
  Administered 2019-01-20: 09:00:00 0.5 mL via INTRAMUSCULAR
  Filled 2019-01-19: qty 0.5

## 2019-01-19 MED ORDER — MORPHINE SULFATE (PF) 2 MG/ML IV SOLN
2.0000 mg | INTRAVENOUS | Status: DC | PRN
  Administered 2019-01-19: 4 mg via INTRAVENOUS
  Filled 2019-01-19: qty 2

## 2019-01-19 MED ORDER — CHLORHEXIDINE GLUCONATE CLOTH 2 % EX PADS
6.0000 | MEDICATED_PAD | Freq: Every day | CUTANEOUS | Status: AC
  Administered 2019-01-19 – 2019-01-23 (×4): 6 via TOPICAL

## 2019-01-19 MED ORDER — PHENOL 1.4 % MT LIQD
1.0000 | OROMUCOSAL | Status: DC | PRN
  Filled 2019-01-19: qty 177

## 2019-01-19 MED ORDER — ESMOLOL HCL-SODIUM CHLORIDE 2000 MG/100ML IV SOLN
25.0000 ug/kg/min | INTRAVENOUS | Status: DC
  Administered 2019-01-19: 01:00:00 25 ug/kg/min via INTRAVENOUS
  Filled 2019-01-19: qty 100

## 2019-01-19 MED ORDER — LABETALOL HCL 5 MG/ML IV SOLN
20.0000 mg | Freq: Once | INTRAVENOUS | Status: AC
  Administered 2019-01-19: 01:00:00 20 mg via INTRAVENOUS
  Filled 2019-01-19: qty 4

## 2019-01-19 MED ORDER — FENTANYL CITRATE (PF) 100 MCG/2ML IJ SOLN
INTRAMUSCULAR | Status: AC
  Filled 2019-01-19: qty 2

## 2019-01-19 MED ORDER — ALUM & MAG HYDROXIDE-SIMETH 200-200-20 MG/5ML PO SUSP: 15.0000 mL | ORAL | Status: DC | PRN

## 2019-01-19 MED ORDER — ONDANSETRON HCL 4 MG/2ML IJ SOLN
4.0000 mg | Freq: Once | INTRAMUSCULAR | Status: AC
  Administered 2019-01-19: 4 mg via INTRAVENOUS

## 2019-01-19 MED ORDER — MUPIROCIN 2 % EX OINT
1.0000 "application " | TOPICAL_OINTMENT | Freq: Two times a day (BID) | CUTANEOUS | Status: AC
  Administered 2019-01-19 – 2019-01-23 (×9): 1 via NASAL
  Filled 2019-01-19 (×3): qty 22

## 2019-01-19 MED ORDER — HYDROMORPHONE HCL 1 MG/ML IJ SOLN
0.5000 mg | INTRAMUSCULAR | Status: DC | PRN
  Administered 2019-01-21 – 2019-01-23 (×9): 0.5 mg via INTRAVENOUS
  Filled 2019-01-19 (×2): qty 1
  Filled 2019-01-19 (×3): qty 0.5
  Filled 2019-01-19 (×2): qty 1
  Filled 2019-01-19: qty 0.5
  Filled 2019-01-19: qty 1

## 2019-01-19 MED ORDER — FAMOTIDINE IN NACL 20-0.9 MG/50ML-% IV SOLN
20.0000 mg | INTRAVENOUS | Status: DC
  Administered 2019-01-19 – 2019-01-21 (×3): 20 mg via INTRAVENOUS
  Filled 2019-01-19 (×4): qty 50

## 2019-01-19 MED ORDER — ONDANSETRON HCL 4 MG/2ML IJ SOLN: 4.0000 mg | Freq: Four times a day (QID) | INTRAMUSCULAR | Status: DC | PRN

## 2019-01-19 MED ORDER — LABETALOL HCL 5 MG/ML IV SOLN
INTRAVENOUS | Status: AC
  Filled 2019-01-19: qty 8

## 2019-01-19 MED ORDER — PROMETHAZINE HCL 25 MG/ML IJ SOLN
6.2500 mg | Freq: Four times a day (QID) | INTRAMUSCULAR | Status: DC | PRN
  Administered 2019-01-19 – 2019-01-23 (×3): 6.25 mg via INTRAVENOUS
  Filled 2019-01-19 (×3): qty 1

## 2019-01-19 NOTE — Progress Notes (Addendum)
Interim Progress Note CCM   Off NTG Labetalol gtt at 2 mcg BP 124/63, HR is 61 Pain scale 3/10, with morphine Creatinine up to 2.02 Troponin up to 0.09 QTc 0.520 Vomited , nausea  Plan: BMET at 1600 to reassess creatinine  Maintain BP 110-120 systolic Consider adding Norvasc 5 mg as needed Bedside QTc monitoring , document Q 6 Will d/c Zofran Will dc protonix Will start Pepcid IV, switch to po once NPO status d/c'd Cardiology consult Consider 12 Lead Check Mag>> goal > 2.0 Calcium corrects to 9.4>> Monitor  Bevelyn Ngo, AGACNP-BC [facility] Pulmonary/Critical Care Medicine Pager # [phone] After 4 pm  [phone] 01/19/2019 8:36 AM

## 2019-01-19 NOTE — Progress Notes (Signed)
Carotid artery duplex and renal artery duplex completed. Refer to "CV Proc" under chart review to view preliminary results.  01/19/2019 5:23 PM Gertie Fey, MHA, RVT, RDCS, RDMS

## 2019-01-19 NOTE — ED Notes (Signed)
Updated pt's husband via phone.  

## 2019-01-19 NOTE — ED Notes (Signed)
Husband Annette Stable @ [phone], wants a pt update

## 2019-01-19 NOTE — ED Notes (Signed)
ED Provider at bedside. 

## 2019-01-19 NOTE — Progress Notes (Addendum)
eLink Physician-Brief Progress Note Patient Name: Heather Pollard DOB: 04/13/1961 MRN: [id number]   Date of Service  01/19/2019  HPI/Events of Note  58 year old female with a history of hypertension admitted to the ICU because of type B aortic dissection.   eICU Interventions  Vascular surgery is the primary.  Dr. Darrick Penna (cardiovascular surgeon) evaluated the patient and without endorgan damage and diameter of 3 cm decided for medical management.  Maintain systolic blood pressure less than 130. Ordered Labetalol drip.      Intervention Category Major Interventions: Hypertension - evaluation and management Intermediate Interventions: Communication with other healthcare providers and/or family Evaluation Type: New Patient Evaluation  Glory Rosebush 01/19/2019, 2:24 AM

## 2019-01-19 NOTE — Progress Notes (Signed)
PCCM Interval Note  Discussed patient care with Dr. Excell Seltzer, Cardiology, who will assume management for anti-hypertensive regimen.   Patient does not have any critical care needs at this time. PCCM will be available as needed. Please call [phone] pager for concerns or questions.  Will sign off.

## 2019-01-19 NOTE — Plan of Care (Signed)
  Problem: Education: Goal: Knowledge of General Education information will improve Description: Including pain rating scale, medication(s)/side effects and non-pharmacologic comfort measures Outcome: Progressing   Problem: Clinical Measurements: Goal: Ability to maintain clinical measurements within normal limits will improve Outcome: Progressing   

## 2019-01-19 NOTE — Consult Note (Signed)
Cardiology Consultation:   Patient ID: Heather Pollard MRN: [id number]; DOB: 1961/08/17  Admit date: 01/18/2019 Date of Consult: 01/19/2019  Primary Care Provider: Patient, No Pcp Per Primary Cardiologist: Tonny BollmanMichael Lilyahna Sirmon, MD - new Primary Electrophysiologist:  None    Patient Profile:   Heather Pollard is a 58 y.o. female with a hx of ongoing tobacco abuse who is being seen today for blood pressure management in the setting of acute type B aortic dissection at the request of Dr. Darrick PennaFields.  History of Present Illness:   Heather Pollard had not seen a PCP in several years. She was on no medications at home and is a current everyday smoker. [facility] with chest and back pain found to have a Type B aortic dissection. She was hypertensive in the 190s systolic. D dimer was elevated and CTA confirmed dissection with involvement of the iliac artery and occlusion of the left kidney with hydronephrosis.  Vascular surgery was consulted and recommends medical management with tight BP control: systolic less than 130. She was started on esmolol, labetalol, and nitro drips for blood pressure control. Nitro and esmolol have since been weaned off. She has AKI with creatinine 2.02. She continues to have back and chest pain.  Elevated troponin: 0.03 --> 0.09 EKG with sinus, prolonged QTc   Past Medical History:  Diagnosis Date   Ectopic pregnancy     Past Surgical History:  Procedure Laterality Date   BREAST SURGERY     PARTIAL HYSTERECTOMY       Home Medications:  Prior to Admission medications   Medication Sig Start Date End Date Taking? Authorizing Provider  ibuprofen (ADVIL,MOTRIN) 600 MG tablet Take 1 tablet (600 mg total) by mouth every 6 (six) hours as needed. Patient not taking: Reported on 01/19/2019 05/14/14   Benjiman CorePickering, Nathan, MD  oxyCODONE-acetaminophen (PERCOCET/ROXICET) 5-325 MG per tablet Take 1-2 tablets by mouth every 6 (six) hours as needed. Patient not taking: Reported  on 01/19/2019 05/14/14   Benjiman CorePickering, Nathan, MD    Inpatient Medications: Scheduled Meds:  docusate sodium  100 mg Oral BID   [START ON 01/20/2019] pneumococcal 23 valent vaccine  0.5 mL Intramuscular Tomorrow-1000   Continuous Infusions:  dextrose 5 % and 0.45% NaCl 100 mL/hr at 01/19/19 0745   famotidine (PEPCID) IV     labetalol (NORMODYNE) infusion 2 mg/min (01/19/19 0938)   PRN Meds: acetaminophen **OR** acetaminophen, alum & mag hydroxide-simeth, guaiFENesin-dextromethorphan, HYDROmorphone (DILAUDID) injection, labetalol, phenol, promethazine  Allergies:    Allergies  Allergen Reactions   Penicillins Hives and Diarrhea    Social History:   Social History   Socioeconomic History   Marital status: Married    Spouse name: Not on file   Number of children: Not on file   Years of education: Not on file   Highest education level: Not on file  Occupational History   Not on file  Social Needs   Financial resource strain: Not on file   Food insecurity:    Worry: Not on file    Inability: Not on file   Transportation needs:    Medical: Not on file    Non-medical: Not on file  Tobacco Use   Smoking status: Current Every Day Smoker   Smokeless tobacco: Never Used  Substance and Sexual Activity   Alcohol use: No   Drug use: Not on file   Sexual activity: Not on file  Lifestyle   Physical activity:    Days per week: Not on file  Minutes per session: Not on file   Stress: Not on file  Relationships   Social connections:    Talks on phone: Not on file    Gets together: Not on file    Attends religious service: Not on file    Active member of club or organization: Not on file    Attends meetings of clubs or organizations: Not on file    Relationship status: Not on file   Intimate partner violence:    Fear of current or ex partner: Not on file    Emotionally abused: Not on file    Physically abused: Not on file    Forced sexual activity: Not on  file  Other Topics Concern   Not on file  Social History Narrative   Not on file    Family History:   History reviewed. No pertinent family history.   ROS:  Please see the history of present illness.   All other ROS reviewed and negative.     Physical Exam/Data:   Vitals:   01/19/19 0715 01/19/19 0730 01/19/19 0745 01/19/19 0824  BP: 123/66 (!) 114/58 124/64   Pulse: 64 63 61   Resp: Temp:   98 F (36.7 C) 97.9 F (36.6 C)  TempSrc:   Oral Oral  SpO2: 99% 95% 99%   Weight:      Height:        Intake/Output Summary (Last 24 hours) at 01/19/2019 1007 Last data filed at 01/19/2019 0745 Gross per 24 hour  Intake 643.89 ml  Output --  Net 643.89 ml   Last 3 Weights 01/18/2019 05/13/2014  Weight (lbs) 140 lb 131 lb  Weight (kg) 63.504 kg 59.421 kg     Body mass index is 22.6 kg/m.  General:  Well nourished, well developed, in no acute distress HEENT: normal Lymph: no adenopathy Neck: no JVD Endocrine:  No thryomegaly Vascular: No carotid bruits; FA pulses 2+ bilaterally Cardiac:  normal S1, S2; RRR; no murmur  Lungs:  clear to auscultation bilaterally, no wheezing, rhonchi or rales  Abd: soft, nontender, no hepatomegaly  Ext: no edema Musculoskeletal:  No deformities, BUE and BLE strength normal and equal.  Pedal pulses are 2+ bilaterally.  The feet are warm. Skin: warm and dry  Neuro:  CNs 2-12 intact, no focal abnormalities noted Psych:  Normal affect   EKG:  The EKG was personally reviewed and demonstrates:  Sinus with first-degree AV block, prolonged QTc.  QTc 514 ms.  Telemetry:  Telemetry was personally reviewed and demonstrates: Normal sinus rhythm without arrhythmia.  Relevant CV Studies:  Echo pending  Laboratory Data:  Chemistry Recent Labs  Lab 01/18/19 2339 01/18/19 2342 01/19/19 0546  NA 140  --  138  K 3.6  --  4.4  CL 107  --  105  CO2 18*  --  21*  GLUCOSE 141*  --  192*  BUN 25*  --  26*  CREATININE 1.94* 1.80* 2.02*   CALCIUM 9.3  --  8.7*  GFRNONAA 28*  --  26*  GFRAA 32*  --  31*  ANIONGAP 15  --  12    Recent Labs  Lab 01/18/19 2339 01/19/19 0546  PROT 7.3 6.1*  ALBUMIN 3.7 3.1*  AST 42* 35  ALT 31 27  ALKPHOS 86 73  BILITOT 0.8 0.6   Hematology Recent Labs  Lab 01/18/19 2339 01/19/19 0546  WBC 11.6* 12.0*  RBC 4.46 3.93  HGB 13.4 11.8*  HCT 39.1 35.3*  MCV 87.7 89.8  MCH 30.0 30.0  MCHC 34.3 33.4  RDW 12.5 12.5  PLT 241 206   Cardiac Enzymes Recent Labs  Lab 01/18/19 2339 01/19/19 0546  TROPONINI 0.03* 0.09*   No results for input(s): TROPIPOC in the last 168 hours.  BNP Recent Labs  Lab 01/18/19 2339  BNP 71.7    DDimer  Recent Labs  Lab 01/18/19 2339  DDIMER >20.00*    Radiology/Studies:  Dg Chest Portable 1 View  Result Date: 01/18/2019 CLINICAL DATA:  Chest pain and shortness of breath EXAM: PORTABLE CHEST 1 VIEW COMPARISON:  05/30/2005 FINDINGS: The heart size and mediastinal contours are within normal limits. Both lungs are clear. The visualized skeletal structures are unremarkable. IMPRESSION: No active disease. Electronically Signed   By: Alcide Clever M.D.   On: 01/18/2019 23:41   Ct Angio Chest/abd/pel For Dissection W And/or Wo Contrast  Result Date: 01/19/2019 CLINICAL DATA:  Chest pain, back pain, shortness of breath. EXAM: CT ANGIOGRAPHY CHEST, ABDOMEN AND PELVIS TECHNIQUE: Multidetector CT imaging through the chest, abdomen and pelvis was performed using the standard protocol during bolus administration of intravenous contrast. Multiplanar reconstructed images and MIPs were obtained and reviewed to evaluate the vascular anatomy. CONTRAST:  OMNIPAQUE IOHEXOL 350 MG/ML SOLN COMPARISON:  None. FINDINGS: CTA CHEST FINDINGS Cardiovascular: There is a type B aortic dissection beginning just beyond the origin of the left subclavian artery. The false lumen within the chest is predominantly thrombosed. Great vessels are patent and uninvolved. Aortic  atherosclerosis. Ascending thoracic aorta normal caliber and uninvolved. Heart is normal size. Mediastinum/Nodes: No mediastinal, hilar, or axillary adenopathy. Lungs/Pleura: Lungs are clear. No focal airspace opacities or suspicious nodules. No effusions. Musculoskeletal: Chest wall soft tissues are unremarkable. No acute bony abnormality. Review of the MIP images confirms the above findings. CTA ABDOMEN AND PELVIS FINDINGS VASCULAR Aorta: The dissection continues through the abdominal aorta with contrast opacification noted in the false lumen within the abdomen. The celiac artery and superior mesenteric artery arise from the true lumen. The inferior mesenteric artery arises from the false lumen. All are patent. Both renal arteries appear to arise from the true lumen. 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 Celiac: Patent, arising from the true lumen. SMA: Patent, arising from the true lumen. Renals: Patent, arising from the true lumen. IMA: Patent, arising from the false lumen. Inflow: Dissection continues into both common iliac arteries, terminating on the left above the common iliac bifurcation. This continues on the right into the proximal right external iliac artery. Iliofemoral vessels are patent. There is abnormal soft tissue surrounding the distal left common iliac artery and proximal left external and internal iliac arteries. I see no contrast extravasation, but cannot exclude leak. Veins: No obvious venous abnormality within the limitations of this arterial phase study. Review of the MIP images confirms the above findings. NON-VASCULAR Hepatobiliary: No focal hepatic abnormality. Gallbladder unremarkable. Pancreas: No focal abnormality or ductal dilatation. Spleen: No focal abnormality.  Normal size. Adrenals/Urinary Tract: There is left hydronephrosis and delayed excretion of contrast from the left kidney. The left ureter is dilated to the pelvic brim and the abnormal soft  tissue surrounding the left iliac vessels. No visible stones. Adrenal glands and urinary bladder unremarkable. Right kidney unremarkable. Stomach/Bowel: Stomach, large and small bowel grossly unremarkable. Lymphatic: No adenopathy Reproductive: Uterus and adnexa unremarkable.  No mass. Other: No free fluid or free air. Musculoskeletal: No acute bony abnormality. Review of the  MIP images confirms the above findings. IMPRESSION: Type B thoracoabdominal aortic dissection. False lumen is thrombosed in the chest, patent in the abdomen, and extends into the right external iliac artery and left common iliac artery. Great vessels are patent and uninvolved. Abnormal soft tissue surrounds the left iliac vessels and causes obstruction of the left kidney. Cannot exclude leak from the iliac vessel although no active extravasation of contrast is seen. As mentioned above, left hydronephrosis and delayed excretion of contrast from the left kidney. Critical Value/emergent results were called by telephone at the time of interpretation on 01/19/2019 at 12:40 am to Dr. Glynn Octave , who verbally acknowledged these results. Electronically Signed   By: Charlett Nose M.D.   On: 01/19/2019 00:40    Assessment and Plan:   1. Hypertension, hypertensive urgency  2. Type B aortic dissection - BP control will be very important for her - nitro and esmolol drips weaned off - currently on labetalol drip with IV labetalol injections overnght - would favor an echocardiogram to evaluate structure and function to help guide medication selection for BP - pressures now controlled - 2 mg/min drip - transition to PO labetalol 400 mg TID to start, will likely need higher dose - avoid ACEI/ARB/spiro for now until renal function improves  3. Chest and back pain -secondary to #2  4. Elevated troponin - chest and back pain thought to be related to dissection - given fentanyl and dilaudid (morphine caused itching) - elevated troponin likely  demand ischemia in the setting of hypertensive emergency - EKG without clear signs of acute ischemia   5. Prolonged QTc - avoid QT prolonging agents - recheck EKG tomorrow morning  6. AKI - creatinine now > 2 - will defer to primary team - evidence of occlusion and hydronephrosis (left) on CT - avoid ACEI/ARB for now  7. Current everyday smoker - encouraged cessation  For questions or updates, please contact [facility] Please consult [email or web address] for contact info under   Signed, Marcelino Duster, PA  01/19/2019 10:07 AM   Patient seen, examined. Available data reviewed. Agree with findings, assessment, and plan as outlined by Bettina Gavia, PA.  The patient is independently interviewed and examined.  The exam findings documented above reflect my personal examination of this patient this afternoon.  She has no significant past medical history but has not had regular medical care.  Suspect she has been hypertensive for some time without carrying the official diagnosis or taking antihypertensive medications.    The patient CTA images are reviewed and she has extensive type B dissection from just distal to the left subclavian artery down to the bilateral iliac arteries.  Review of her telemetry demonstrates sinus rhythm.  She does have QT prolongation seen on EKG and it will be important to keep her potassium greater than 4, magnesium greater than 2, and avoid QT prolonging medications (phenergan safer than zofran for nausea/vomiting). Will repeat an EKG tomorrow.   For her hypertension, will add amlodipine when she is able to take oral medicine as her nausea and vomiting improved.  Would consider transition her from IV to oral labetalol tomorrow depending on her clinical stability and blood pressure response.  We will follow with you.  Tonny Bollman, M.D. 01/19/2019 2:44 PM

## 2019-01-19 NOTE — ED Notes (Signed)
Vascular surgeon at bedside.

## 2019-01-19 NOTE — Consult Note (Addendum)
Referring Physician: Dr Manus Gunning  Patient name: Heather Pollard MRN: [id number] DOB: 1960-11-09 Sex: female  REASON FOR CONSULT: type B aortic dissection  HPI: Heather Pollard is a 58 y.o. female, with acute onset back and chest pain last evening.  She felt it was hard to catch her breath.  She denies history of hypertension but has not seen primary MD for several years.  She does not check her BP at home.  She smokes 1 PPD. She denies alcohol abuse.  She denies cocaine use.  She has no family history of aortic aneurysm or dissection.  She was started on esmolol by the ER.  She states her chest pain though present is much improved.  She denies abdominal pain.  She does not have weakness in her legs or numbness.   Past Medical History:  Diagnosis Date  . Ectopic pregnancy    Past Surgical History:  Procedure Laterality Date  . BREAST SURGERY    . PARTIAL HYSTERECTOMY      History reviewed. No pertinent family history.  SOCIAL HISTORY: Social History   Socioeconomic History  . Marital status: Married    Spouse name: Not on file  . Number of children: Not on file  . Years of education: Not on file  . Highest education level: Not on file  Occupational History  . Not on file  Social Needs  . Financial resource strain: Not on file  . Food insecurity:    Worry: Not on file    Inability: Not on file  . Transportation needs:    Medical: Not on file    Non-medical: Not on file  Tobacco Use  . Smoking status: Current Every Day Smoker  . Smokeless tobacco: Never Used  Substance and Sexual Activity  . Alcohol use: No  . Drug use: Not on file  . Sexual activity: Not on file  Lifestyle  . Physical activity:    Days per week: Not on file    Minutes per session: Not on file  . Stress: Not on file  Relationships  . Social connections:    Talks on phone: Not on file    Gets together: Not on file    Attends religious service: Not on file    Active member of club or  organization: Not on file    Attends meetings of clubs or organizations: Not on file    Relationship status: Not on file  . Intimate partner violence:    Fear of current or ex partner: Not on file    Emotionally abused: Not on file    Physically abused: Not on file    Forced sexual activity: Not on file  Other Topics Concern  . Not on file  Social History Narrative  . Not on file    Allergies  Allergen Reactions  . Penicillins Hives and Diarrhea    Current Facility-Administered Medications  Medication Dose Route Frequency Provider Last Rate Last Dose  . esmolol (BREVIBLOC) 2000 mg / 100 mL (20 mg/mL) infusion  25-300 mcg/kg/min Intravenous Continuous Rancour, Stephen, MD 14.29 mL/hr at 01/19/19 0104 75 mcg/kg/min at 01/19/19 0104  . fentaNYL (SUBLIMAZE) 100 MCG/2ML injection           . nitroGLYCERIN 50 mg in dextrose 5 % 250 mL (0.2 mg/mL) infusion  0-200 mcg/min Intravenous Continuous Rancour, Stephen, MD 1.5 mL/hr at 01/19/19 0118 5 mcg/min at 01/19/19 0118   Current Outpatient Medications  Medication Sig Dispense Refill  . ibuprofen (ADVIL,MOTRIN) 600  MG tablet Take 1 tablet (600 mg total) by mouth every 6 (six) hours as needed. 20 tablet 0  . oxyCODONE-acetaminophen (PERCOCET/ROXICET) 5-325 MG per tablet Take 1-2 tablets by mouth every 6 (six) hours as needed. 10 tablet 0    ROS:   General:  No weight loss, Fever, chills  HEENT: No recent headaches, no nasal bleeding, no visual changes, no sore throat  Neurologic: No dizziness, blackouts, seizures. No recent symptoms of stroke or mini- stroke. No recent episodes of slurred speech, or temporary blindness.  Cardiac: No recent episodes of chest pain/pressure, no shortness of breath at rest.  No shortness of breath with exertion.  Denies history of atrial fibrillation or irregular heartbeat  Vascular: No history of rest pain in feet.  No history of claudication.  No history of non-healing ulcer, No history of DVT    Pulmonary: No home oxygen, no productive cough, no hemoptysis,  No asthma or wheezing  Musculoskeletal:  [ ]  Arthritis, [ ]  Low back pain,  [ ]  Joint pain  Hematologic:No history of hypercoagulable state.  No history of easy bleeding.  No history of anemia  Gastrointestinal: No hematochezia or melena,  No gastroesophageal reflux, no trouble swallowing  Urinary: [ ]  chronic Kidney disease, [ ]  on HD - [ ]  MWF or [ ]  TTHS, [ ]  Burning with urination, [ ]  Frequent urination, [ ]  Difficulty urinating;   Skin: No rashes  Psychological: No history of anxiety,  No history of depression   Physical Examination  Vitals:   01/19/19 0050 01/19/19 0100 01/19/19 0110 01/19/19 0115  BP: (!) 181/86 (!) 158/78 (!) 168/78   Pulse: 80 75 74 74  Resp: 18 15 19 15   SpO2: 100% 100% 100% 100%  Weight:      Height:        Body mass index is 22.6 kg/m.  General:  Alert and oriented, no acute distress HEENT: Normal Neck: No bruit or JVD Pulmonary: Clear to auscultation bilaterally Cardiac: Regular Rate and Rhythm  Abdomen: Soft, non-tender, non-distended, no mass, no scars Skin: No rash Extremity Pulses:  2+ radial, brachial, femoral, dorsalis pedis, asent left 2+ right posterior tibial pulses Musculoskeletal: No deformity or edema  Neurologic: Upper and lower extremity motor 5/5 and symmetric  DATA:  CTA aortic diameter fairly uniform just over 3 cm.  Right renal less filled looks like true lumen SMA celiac left renal all true lumen, dissection into iliacs bilaterally but external iliacs fill  CBC    Component Value Date/Time   WBC 11.6 (H) 01/18/2019 2339   RBC 4.46 01/18/2019 2339   HGB 13.4 01/18/2019 2339   HCT 39.1 01/18/2019 2339   PLT 241 01/18/2019 2339   MCV 87.7 01/18/2019 2339   MCH 30.0 01/18/2019 2339   MCHC 34.3 01/18/2019 2339   RDW 12.5 01/18/2019 2339   LYMPHSABS 4.1 (H) 01/18/2019 2339   MONOABS 1.1 (H) 01/18/2019 2339   EOSABS 0.3 01/18/2019 2339   BASOSABS 0.1  01/18/2019 2339    BMET    Component Value Date/Time   NA 140 01/18/2019 2339   K 3.6 01/18/2019 2339   CL 107 01/18/2019 2339   CO2 18 (L) 01/18/2019 2339   GLUCOSE 141 (H) 01/18/2019 2339   BUN 25 (H) 01/18/2019 2339   CREATININE 1.80 (H) 01/18/2019 2342   CALCIUM 9.3 01/18/2019 2339   GFRNONAA 28 (L) 01/18/2019 2339   GFRAA 32 (L) 01/18/2019 2339   Creatinine 1.7 in 2015  EKG no  ST elevation  COVID neg  Troponin 0.03  ASSESSMENT:  Type B aortic dissection currently 3 cm aorta with no evidence of end organ damage.   PLAN:  Will admit to ICU for BP control.  Discussed with critical care service who will assist with BP management.  Pt currently on Esmolol drip.  Intitial target BP will be SBP < 130.  If chest pain not improved will titrate to SBP < 110.  She will need medical care established as outpt for tight BP control.  Currently no indication for operative intervention. Continue to monitor for malperfusion of gut kidney legs.  Currently has creatinine of 1.8 near baseline of 1.7.  No abdominal pain or leg pain/numbness with palpable pulses.  Will need repeat CT prior to d/c from hospital  Smoking cessation discussed   Fabienne Brunsharles Shirlette Scarber, MD [facility] [place] Office: [phone] Pager: [phone]

## 2019-01-19 NOTE — Progress Notes (Addendum)
  Progress Note    01/19/2019 7:26 AM Hospital Day 1  Subjective:  Still having left sided chest and back pain, but much improved this morning than when she arrived at the hospital.  Says the right sided pain has resolved.  Denies any abdominal pain. Says she got some relief with the Morphine but it made her itch.  Wants to know if she can have something to drink or ice chips.  Afebrile HR 60's-70's NSR 170's-150's on admit 120's-130's since 0300 this morning (110's this am on rounds) 95% RA  Gtts:   Labetalol NTG   Vitals:   01/19/19 0415 01/19/19 0430  BP: 139/74 123/65  Pulse: 72 69  Resp: 17 12  Temp:    SpO2: 96% 94%    Physical Exam: Cardiac:  regular Lungs:  Non labored Extremities:  Easily palpable bilateral radial pulses;  Right DP/PT 2+ palpable and 1+ palpable left DP (unable to palpate right PT)  CBC    Component Value Date/Time   WBC 12.0 (H) 01/19/2019 0546   RBC 3.93 01/19/2019 0546   HGB 11.8 (L) 01/19/2019 0546   HCT 35.3 (L) 01/19/2019 0546   PLT 206 01/19/2019 0546   MCV 89.8 01/19/2019 0546   MCH 30.0 01/19/2019 0546   MCHC 33.4 01/19/2019 0546   RDW 12.5 01/19/2019 0546   LYMPHSABS 4.1 (H) 01/18/2019 2339   MONOABS 1.1 (H) 01/18/2019 2339   EOSABS 0.3 01/18/2019 2339   BASOSABS 0.1 01/18/2019 2339    BMET    Component Value Date/Time   NA 140 01/18/2019 2339   K 3.6 01/18/2019 2339   CL 107 01/18/2019 2339   CO2 18 (L) 01/18/2019 2339   GLUCOSE 141 (H) 01/18/2019 2339   BUN 25 (H) 01/18/2019 2339   CREATININE 1.80 (H) 01/18/2019 2342   CALCIUM 9.3 01/18/2019 2339   GFRNONAA 28 (L) 01/18/2019 2339   GFRAA 32 (L) 01/18/2019 2339    INR No results found for: INR   Intake/Output Summary (Last 24 hours) at 01/19/2019 0726 Last data filed at 01/19/2019 0400 Gross per 24 hour  Intake 208.35 ml  Output -  Net 208.35 ml     Assessment/Plan:  58 y.o. female with type B aortic dissection Hospital Day 1  -pt on NTG and labetalol gtt  at this time.   -continue strict BP control -now with increasing QTC interval.  Called cardiology consult for assistance with BP control as well.  -mild AKI on CKD with creatinine reaching 1.94 (down to 1.8 last evening).  CMP pending this morning.  Continue hydration. -mild leukocytosis-continue to monitor -will change morphine to Dilaudid due to itching with Morphine -continue npo until Dr. Randie Heinz sees pt.  -will need repeat CT scan prior to discharge   Doreatha Massed, PA-C [facility] [phone] 01/19/2019 7:26 AM   I have interviewed and examined patient with PA and agree with assessment and plan above. Consult to cardiology and their assistance appreciated. No indication for surgical intervention at this time. Will get carotid duplex as she would need likely debranching if TEVAR ever necessary and also renal duplex given elevated Cr today. Will rescan prior to discharge if renal function allows.   Sarkis Rhines C. Randie Heinz, MD [facility] [place] Office: [phone] Pager: [phone]

## 2019-01-19 NOTE — Consult Note (Addendum)
NAME:  Heather Pollard, MRN:  [id number], DOB:  [birth year], LOS: 0 ADMISSION DATE:  01/18/2019, CONSULTATION DATE:  01/19/2019 REFERRING MD:  Dr. Darrick Penna, CHIEF COMPLAINT:   CP/ back pain/ SOB  Brief History   58 yoF presenting from home with acute onset back pain and hypertensive.  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  VSS admitting, PCCM consulting for medical management.   History of present illness    58 year old female with only known history of tobacco abuse. She has not seen a primary care physician in years because she has felt she had no reason to. Presented from home via EMS with acute onset of severe back pain between her scapula, chest pain, inability to take deep breath at rest. Additionally noted some bilateral tingling in her feet at the same time. Noted to be hypertensive with EMS with SBP ~190.  Was in her normal state of health prior to this.  She denies ETOH or illicit drug abuse.  Denies prior CP, SOB, fever, headache, back pain, or neurovascular deficits.  In ER, she was afebrile, hypertensive 179/97, SR 70's, neurologically and neurovascularly intact.  Labs noted for Hgb 13.4, WBC 11.6, sCr 1.94 (baseline 1.70 in 2015), troponin 0.03, BNP 71, ddimer > 20.  CXR normal.  Underwent CTA which confirmed type B aortic dissection involving the iliac artery as well as occlusion of left kidney with hydronephrosis.  Vascular surgery consulted with recommendations for medical management at this time.  She was started on labetalol, esmolol and nitroglycerin drips for target systolic pressure less than 140.  PCCM consulted to assist with medical management.   Past Medical History  Tobacco abuse, ectopic pregnancy  Significant Hospital Events   5/5 Admitted   Consults:  VVS  Procedures:   Significant Diagnostic Tests:  5/5 CTA chest/ abd/ pelvis >> Type B thoracoabdominal aortic dissection. False lumen is thrombosed in the chest, patent in the abdomen, and extends into the right  external iliac artery and left common iliac artery.  Great vessels are patent and uninvolved.  Abnormal soft tissue surrounds the left iliac vessels and causes obstruction of the left kidney. Cannot exclude leak from the iliac vessel although no active extravasation of contrast is seen.  Left hydronephrosis and delayed excretion of contrast from the left kidney.  Micro Data:   Antimicrobials:   Interim history/subjective:  Has been sleeping, from earlier fentanyl  Currently, she reports 6/10 pain in her back and still does not feel like she can take a deep breath.   Currently on labetalol 0.5 mg/ min and NTG 10 mcg/min  Objective   Blood pressure (!) 153/91, pulse 64, resp. rate 16, height  (1.676 m), weight 63.5 kg, SpO2 93 %.     Intake/Output Summary (Last 24 hours) at 01/19/2019 0356 Last data filed at 01/19/2019 0259 Gross per 24 hour  Intake 60.65 ml  Output -  Net 60.65 ml   Filed Weights   01/18/19 2355  Weight: 63.5 kg   Examination: General:  Very pleasant older appearing female lying in bed in NAD HEENT: MM pink/moist, pupils 3/reactive, anicteric  Neuro: Awake, oriented x 3, non focal - no current sensory deficits CV: SR 60-70's, no murmur, 2+ radial/ pedal pulses PULM: even/non-labored, lungs bilaterally clear, room air 100% NW:GNFA, non-tender, bs active  Extremities: warm/dry, no edema  Skin: fine petechial rash below BP cuff on R arm, non-puritic   Resolved Hospital Problem list    Assessment & Plan:  Type B Aortic Dissection Hypertensive crisis P:  Appreciate Vascular surgery recommendations- medical management only at this time ICU monitoring - monitor for ongoing back/ chest/ abd pain NPO wean nitro gtt off, continue Labetalol gtt for goal SBP <140; HR goal <80 - given her ongoing pain, will reduce SBP goal to 110 per Vascular surgery  recs Frequent neuro-vascular checks Consider adding Norvasc 5 mg PO in am  Strict UOP Further imaging per  vascular  morphine prn pain   Mild AKI on chronic kidney disease s/p IV contrast Left hydronephrosis - from surrounding tissues of left iliac artery P:  Gentle hydration D5/0.45 NS at 100 ml/hr  Recheck BMP at 1600 Trend BMP / urinary output Replace electrolytes as indicated Avoid nephrotoxic agents, ensure adequate renal perfusion  Leukocytosis - afebrile P:  Likely reactive Monitor clinically Trend WBC/ fever curve   Positive troponin - EKG unchanged P:  Trend troponin/ EKG q 6hr x 2  Tobacco abuse P:  Cessation counseling  Best practice:  Diet: NPO Pain/Anxiety/Delirium protocol (if indicated): n/a VAP protocol (if indicated): n/a DVT prophylaxis: SCDs only  GI prophylaxis: PPI Glucose control: trend, add SSI if >180 Mobility: BR Code Status: Full Family Communication: patient updated on plan of care Disposition: SICU  Labs   CBC: Recent Labs  Lab 01/18/19 2339  WBC 11.6*  NEUTROABS 6.1  HGB 13.4  HCT 39.1  MCV 87.7  PLT 241    Basic Metabolic Panel: Recent Labs  Lab 01/18/19 2339 01/18/19 2342  NA 140  --   K 3.6  --   CL 107  --   CO2 18*  --   GLUCOSE 141*  --   BUN 25*  --   CREATININE 1.94* 1.80*  CALCIUM 9.3  --    GFR: Estimated Creatinine Clearance: 31.5 mL/min (A) (by C-G formula based on SCr of 1.8 mg/dL (H)). Recent Labs  Lab 01/18/19 2339  WBC 11.6*    Liver Function Tests: Recent Labs  Lab 01/18/19 2339  AST 42*  ALT 31  ALKPHOS 86  BILITOT 0.8  PROT 7.3  ALBUMIN 3.7   No results for input(s): LIPASE, AMYLASE in the last 168 hours. No results for input(s): AMMONIA in the last 168 hours.  ABG No results found for: PHART, PCO2ART, PO2ART, HCO3, TCO2, ACIDBASEDEF, O2SAT   Coagulation Profile: No results for input(s): INR, PROTIME in the last 168 hours.  Cardiac Enzymes: Recent Labs  Lab 01/18/19 2339  TROPONINI 0.03*    HbA1C: No results found for: HGBA1C  CBG: No results for input(s): GLUCAP in the  last 168 hours.  Review of Systems:   Review of Systems  Constitutional: Negative for chills and fever.  Respiratory: Positive for shortness of breath. Negative for cough and hemoptysis.   Cardiovascular: Positive for chest pain. Negative for claudication and leg swelling.  Gastrointestinal: Negative for abdominal pain, diarrhea, nausea and vomiting.  Musculoskeletal: Positive for back pain.  Neurological: Positive for tingling. Negative for sensory change, focal weakness, weakness and headaches.  Psychiatric/Behavioral: Negative for substance abuse.   Past Medical History  She,  has a past medical history of Ectopic pregnancy.   Surgical History    Past Surgical History:  Procedure Laterality Date  . BREAST SURGERY    . PARTIAL HYSTERECTOMY       Social History   reports that she has been smoking. She has never used smokeless tobacco. She reports that she does not drink alcohol.   Family History  Her family history is not on file.   Allergies Allergies  Allergen Reactions  . Penicillins Hives and Diarrhea     Home Medications  Prior to Admission medications   Medication Sig Start Date End Date Taking? Authorizing Provider  ibuprofen (ADVIL,MOTRIN) 600 MG tablet Take 1 tablet (600 mg total) by mouth every 6 (six) hours as needed. Patient not taking: Reported on 01/19/2019 05/14/14   Benjiman Core, MD  oxyCODONE-acetaminophen (PERCOCET/ROXICET) 5-325 MG per tablet Take 1-2 tablets by mouth every 6 (six) hours as needed. Patient not taking: Reported on 01/19/2019 05/14/14   Benjiman Core, MD     Critical care time: 40 mins    Posey Boyer, MSN, AGACNP-BC [facility] Pgr: [phone] or if no answer [phone] 01/19/2019, 4:22 AM  Attending MD note  Patient was independently seen and examined, treatment plan was discussed with the  Advance Practice Provider. I agree with the above note by Posey Boyer. I have personally reviewed the clinical  findings, labs, ECG, imaging studies and management of this patient in detail. I have also reviewed the orders written for this patient which were under my direction. I agree with the documentation, as recorded by the Advance Practice Provider.   Briefly, Lue Sykora is a 58 y.o. female with no known PMH here with HTN and type B aortic dissection.  Currently doing well and meeting goals on labetalol and nitroglycerin gtt.  This patient is critically ill, requiring high complexity decision making for assessment and plan, frequent evaluation, application of advanced monitoring and extensive interpretations of multiple databases.    Critical Care time devoted to patient care services described in this note is 45 minutes, not including time spent on procedures, teaching or supervising.    Charlotte Sanes, MD

## 2019-01-19 NOTE — ED Notes (Signed)
ED TO INPATIENT HANDOFF REPORT  ED Nurse Name and Phone #:   Marquita Palms  [phone]  S Name/Age/Gender Heather Pollard 58 y.o. female Room/Bed: TRACC/TRACC  Code Status   Code Status: Full Code  Home/SNF/Other Home Patient oriented to: self, place, time and situation Is this baseline? Yes   Triage Complete: Triage complete  Chief Complaint Back Pain,CP,Hypertensive  Triage Note BIB EMS from home. C/o back pain, mid to lower, sudden onset approx 2200 tonight. Also c/o CP, SOB. 99% RA with clear LS, but tachypnic. States unable to get a deep breath, though doing just that upon presentation to ED. Hypertensive throughout. Given fentanyl.    Allergies Allergies  Allergen Reactions  . Penicillins Hives and Diarrhea    Level of Care/Admitting Diagnosis ED Disposition    ED Disposition Condition Comment   Admit  Hospital Area: [facility] [[id number]]  Level of Care: ICU [6]  Covid Evaluation: N/A  Diagnosis: Aortic dissection distal to left subclavian [facility]) [[id number]]  Admitting Physician: Wynona Neat  Attending Physician: Sherren Kerns [phone]  Estimated length of stay: 3 - 4 days  Certification:: I certify this patient will need inpatient services for at least 2 midnights  Bed request comments: 2H  PT Class (Do Not Modify): Inpatient [101]  PT Acc Code (Do Not Modify): Private [1]       B Medical/Surgery History Past Medical History:  Diagnosis Date  . Ectopic pregnancy    Past Surgical History:  Procedure Laterality Date  . BREAST SURGERY    . PARTIAL HYSTERECTOMY       A IV Location/Drains/Wounds Patient Lines/Drains/Airways Status   Active Line/Drains/Airways    Name:   Placement date:   Placement time:   Site:   Days:   Peripheral IV 01/18/19 Left Antecubital   01/18/19    2324    Antecubital   1   Peripheral IV 01/19/19 Left Hand   01/19/19    0027    Hand   less than 1          Intake/Output Last 24  hours  Intake/Output Summary (Last 24 hours) at 01/19/2019 0249 Last data filed at 01/19/2019 0243 Gross per 24 hour  Intake 49.67 ml  Output -  Net 49.67 ml    Labs/Imaging Results for orders placed or performed during the hospital encounter of 01/18/19 (from the past 48 hour(s))  CBC with Differential/Platelet     Status: Abnormal   Collection Time: 01/18/19 11:39 PM  Result Value Ref Range   WBC 11.6 (H) 4.0 - 10.5 K/uL   RBC 4.46 3.87 - 5.11 MIL/uL   Hemoglobin 13.4 12.0 - 15.0 g/dL   HCT 95.6 21.3 - 08.6 %   MCV 87.7 80.0 - 100.0 fL   MCH 30.0 26.0 - 34.0 pg   MCHC 34.3 30.0 - 36.0 g/dL   RDW 57.8 46.9 - 62.9 %   Platelets 241 150 - 400 K/uL   nRBC 0.0 0.0 - 0.2 %   Neutrophils Relative % 52 %   Neutro Abs 6.1 1.7 - 7.7 K/uL   Lymphocytes Relative 35 %   Lymphs Abs 4.1 (H) 0.7 - 4.0 K/uL   Monocytes Relative 10 %   Monocytes Absolute 1.1 (H) 0.1 - 1.0 K/uL   Eosinophils Relative 2 %   Eosinophils Absolute 0.3 0.0 - 0.5 K/uL   Basophils Relative 1 %   Basophils Absolute 0.1 0.0 - 0.1 K/uL   Immature Granulocytes 0 %  Abs Immature Granulocytes 0.05 0.00 - 0.07 K/uL    Comment: Performed at [facility], [address] [place], [place] [address]  Comprehensive metabolic panel     Status: Abnormal   Collection Time: 01/18/19 11:39 PM  Result Value Ref Range   Sodium 140 135 - 145 mmol/L   Potassium 3.6 3.5 - 5.1 mmol/L   Chloride 107 98 - 111 mmol/L   CO2 18 (L) 22 - 32 mmol/L   Glucose, Bld 141 (H) 70 - 99 mg/dL   BUN 25 (H) 6 - 20 mg/dL   Creatinine, Ser 1.91 (H) 0.44 - 1.00 mg/dL   Calcium 9.3 8.9 - 47.8 mg/dL   Total Protein 7.3 6.5 - 8.1 g/dL   Albumin 3.7 3.5 - 5.0 g/dL   AST 42 (H) 15 - 41 U/L   ALT 31 0 - 44 U/L   Alkaline Phosphatase 86 38 - 126 U/L   Total Bilirubin 0.8 0.3 - 1.2 mg/dL   GFR calc non Af Amer 28 (L) >60 mL/min   GFR calc Af Amer 32 (L) >60 mL/min   Anion gap 15 5 - 15    Comment: Performed at [facility], [address], [place], [place] [address]  Troponin I - ONCE - STAT     Status: Abnormal   Collection Time: 01/18/19 11:39 PM  Result Value Ref Range   Troponin I 0.03 (HH) <0.03 ng/mL    Comment: CRITICAL RESULT CALLED TO, READ BACK BY AND VERIFIED WITH: Deshay Blumenfeld M,RN 01/19/19 0038 WAYK Performed at [facility], [address], [place], [place] [address]   D-dimer, quantitative (not at [facility])     Status: Abnormal   Collection Time: 01/18/19 11:39 PM  Result Value Ref Range   D-Dimer, Quant >20.00 (H) 0.00 - 0.50 ug/mL-FEU    Comment: REPEATED TO VERIFY (NOTE) At the manufacturer cut-off of 0.50 ug/mL FEU, this assay has been documented to exclude PE with a sensitivity and negative predictive value of 97 to 99%.  At this time, this assay has not been approved by the FDA to exclude DVT/VTE. Results should be correlated with clinical presentation. Performed at [facility], [address], [place], [place] [address]   Brain natriuretic peptide     Status: None   Collection Time: 01/18/19 11:39 PM  Result Value Ref Range   B Natriuretic Peptide 71.7 0.0 - 100.0 pg/mL    Comment: Performed at [facility], [address], [place], [place] [address]  I-Stat Creatinine, ED (not at [facility])     Status: Abnormal   Collection Time: 01/18/19 11:42 PM  Result Value Ref Range   Creatinine, Ser 1.80 (H) 0.44 - 1.00 mg/dL  SARS Coronavirus 2 (CEPHEID - Performed in [facility] hospital lab), Hosp Order     Status: None   Collection Time: 01/19/19 12:52 AM  Result Value Ref Range   SARS Coronavirus 2 NEGATIVE NEGATIVE    Comment: (NOTE) If result is NEGATIVE SARS-CoV-2 target nucleic acids are NOT DETECTED. The SARS-CoV-2 RNA is generally detectable in upper and lower  respiratory specimens during the acute phase of infection. The lowest  concentration of SARS-CoV-2 viral copies this assay can detect is 250  copies / mL. A negative result does not preclude SARS-CoV-2 infection  and should not  be used as the sole basis for treatment or other  patient management decisions.  A negative result may occur with  improper  specimen collection / handling, submission of specimen other  than nasopharyngeal swab, presence of viral mutation(s) within the  areas targeted by this assay, and inadequate number of viral copies  (<250 copies / mL). A negative result must be combined with clinical  observations, patient history, and epidemiological information. If result is POSITIVE SARS-CoV-2 target nucleic acids are DETECTED. The SARS-CoV-2 RNA is generally detectable in upper and lower  respiratory specimens dur ing the acute phase of infection.  Positive  results are indicative of active infection with SARS-CoV-2.  Clinical  correlation with patient history and other diagnostic information is  necessary to determine patient infection status.  Positive results do  not rule out bacterial infection or co-infection with other viruses. If result is PRESUMPTIVE POSTIVE SARS-CoV-2 nucleic acids MAY BE PRESENT.   A presumptive positive result was obtained on the submitted specimen  and confirmed on repeat testing.  While 2019 novel coronavirus  (SARS-CoV-2) nucleic acids may be present in the submitted sample  additional confirmatory testing may be necessary for epidemiological  and / or clinical management purposes  to differentiate between  SARS-CoV-2 and other Sarbecovirus currently known to infect humans.  If clinically indicated additional testing with an alternate test  methodology [phone]) is advised. The SARS-CoV-2 RNA is generally  detectable in upper and lower respiratory sp ecimens during the acute  phase of infection. The expected result is Negative. Fact Sheet for Patients:  BoilerBrush.com.cy Fact Sheet for Healthcare Providers: https://pope.com/ This test is not yet approved or cleared by the [place] FDA and has been authorized  for detection and/or diagnosis of SARS-CoV-2 by FDA under an Emergency Use Authorization (EUA).  This EUA will remain in effect (meaning this test can be used) for the duration of the COVID-19 declaration under Section 564(b)(1) of the Act, 21 U.S.C. section 360bbb-3(b)(1), unless the authorization is terminated or revoked sooner. Performed at [facility], [address], [place], [place] [address]    Dg Chest Portable 1 View  Result Date: 01/18/2019 CLINICAL DATA:  Chest pain and shortness of breath EXAM: PORTABLE CHEST 1 VIEW COMPARISON:  05/30/2005 FINDINGS: The heart size and mediastinal contours are within normal limits. Both lungs are clear. The visualized skeletal structures are unremarkable. IMPRESSION: No active disease. Electronically Signed   By: Alcide Clever M.D.   On: 01/18/2019 23:41   Ct Angio Chest/abd/pel For Dissection W And/or Wo Contrast  Result Date: 01/19/2019 CLINICAL DATA:  Chest pain, back pain, shortness of breath. EXAM: CT ANGIOGRAPHY CHEST, ABDOMEN AND PELVIS TECHNIQUE: Multidetector CT imaging through the chest, abdomen and pelvis was performed using the standard protocol during bolus administration of intravenous contrast. Multiplanar reconstructed images and MIPs were obtained and reviewed to evaluate the vascular anatomy. CONTRAST:  OMNIPAQUE IOHEXOL 350 MG/ML SOLN COMPARISON:  None. FINDINGS: CTA CHEST FINDINGS Cardiovascular: There is a type B aortic dissection beginning just beyond the origin of the left subclavian artery. The false lumen within the chest is predominantly thrombosed. Great vessels are patent and uninvolved. Aortic atherosclerosis. Ascending thoracic aorta normal caliber and uninvolved. Heart is normal size. Mediastinum/Nodes: No mediastinal, hilar, or axillary adenopathy. Lungs/Pleura: Lungs are clear. No focal airspace opacities or suspicious nodules. No effusions. Musculoskeletal: Chest wall soft tissues are unremarkable. No acute bony  abnormality. Review of the MIP images confirms the above findings. CTA ABDOMEN AND PELVIS FINDINGS VASCULAR Aorta: The dissection continues through the abdominal aorta with contrast opacification noted in the false lumen within the abdomen. The  celiac artery and superior mesenteric artery arise from the true lumen. The inferior mesenteric artery arises from the false lumen. All are patent. Both renal arteries appear to arise from the true lumen. 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 Celiac: Patent, arising from the true lumen. SMA: Patent, arising from the true lumen. Renals: Patent, arising from the true lumen. IMA: Patent, arising from the false lumen. Inflow: Dissection continues into both common iliac arteries, terminating on the left above the common iliac bifurcation. This continues on the right into the proximal right external iliac artery. Iliofemoral vessels are patent. There is abnormal soft tissue surrounding the distal left common iliac artery and proximal left external and internal iliac arteries. I see no contrast extravasation, but cannot exclude leak. Veins: No obvious venous abnormality within the limitations of this arterial phase study. Review of the MIP images confirms the above findings. NON-VASCULAR Hepatobiliary: No focal hepatic abnormality. Gallbladder unremarkable. Pancreas: No focal abnormality or ductal dilatation. Spleen: No focal abnormality.  Normal size. Adrenals/Urinary Tract: There is left hydronephrosis and delayed excretion of contrast from the left kidney. The left ureter is dilated to the pelvic brim and the abnormal soft tissue surrounding the left iliac vessels. No visible stones. Adrenal glands and urinary bladder unremarkable. Right kidney unremarkable. Stomach/Bowel: Stomach, large and small bowel grossly unremarkable. Lymphatic: No adenopathy Reproductive: Uterus and adnexa unremarkable.  No mass. Other: No free fluid or free air.  Musculoskeletal: No acute bony abnormality. Review of the MIP images confirms the above findings. IMPRESSION: Type B thoracoabdominal aortic dissection. False lumen is thrombosed in the chest, patent in the abdomen, and extends into the right external iliac artery and left common iliac artery. Great vessels are patent and uninvolved. Abnormal soft tissue surrounds the left iliac vessels and causes obstruction of the left kidney. Cannot exclude leak from the iliac vessel although no active extravasation of contrast is seen. As mentioned above, left hydronephrosis and delayed excretion of contrast from the left kidney. Critical Value/emergent results were called by telephone at the time of interpretation on 01/19/2019 at 12:40 am to Dr. Glynn Octave , who verbally acknowledged these results. Electronically Signed   By: Charlett Nose M.D.   On: 01/19/2019 00:40    Pending Labs Unresulted Labs (From admission, onward)    Start     Ordered   01/19/19 0500  CBC  Once,   R     01/19/19 0214   01/19/19 0500  Comprehensive metabolic panel  Once,   R     62/94/76 0214   01/19/19 0500  Hemoglobin A1c  Once,   R     01/19/19 0221   01/19/19 0205  HIV antibody (Routine Testing)  Once,   R     01/19/19 0214          Vitals/Pain Today's Vitals   01/19/19 0210 01/19/19 0220 01/19/19 0230 01/19/19 0240  BP: (!) 147/91 (!) 158/91 (!) 159/82 (!) 150/87  Pulse: 70 76 79 71  Resp: 17 13 20 17   SpO2: 98% 94% 90% 92%  Weight:      Height:      PainSc:        Isolation Precautions No active isolations  Medications Medications  fentaNYL (SUBLIMAZE) 100 MCG/2ML injection (has no administration in time range)  potassium chloride SA (K-DUR) CR tablet 20-40 mEq (has no administration in time range)  ondansetron (ZOFRAN) injection 4 mg (has no administration in time range)  alum & mag  hydroxide-simeth (MAALOX/MYLANTA) 200-200-20 MG/5ML suspension 15-30 mL (has no administration in time range)  pantoprazole  (PROTONIX) EC tablet 40 mg (has no administration in time range)  labetalol (NORMODYNE) injection 10 mg (10 mg Intravenous Given 01/19/19 0247)  guaiFENesin-dextromethorphan (ROBITUSSIN DM) 100-10 MG/5ML syrup 15 mL (has no administration in time range)  phenol (CHLORASEPTIC) mouth spray 1 spray (has no administration in time range)  dextrose 5 %-0.45 % sodium chloride infusion (has no administration in time range)  acetaminophen (TYLENOL) tablet 325-650 mg (has no administration in time range)    Or  acetaminophen (TYLENOL) suppository 325-650 mg (has no administration in time range)  morphine 2 MG/ML injection 2-5 mg (has no administration in time range)  docusate sodium (COLACE) capsule 100 mg (has no administration in time range)  labetalol (NORMODYNE) 500 mg in dextrose 5 % 125 mL (4 mg/mL) infusion (has no administration in time range)  fentaNYL (SUBLIMAZE) injection 50 mcg (50 mcg Intravenous Given 01/18/19 2340)  labetalol (NORMODYNE) injection 20 mg (20 mg Intravenous Given 01/18/19 2342)  iohexol (OMNIPAQUE) 350 MG/ML injection 100 mL (100 mLs Intravenous Contrast Given 01/19/19 0007)  ondansetron (ZOFRAN) injection 4 mg (4 mg Intravenous Given 01/19/19 0026)  fentaNYL (SUBLIMAZE) injection 50 mcg (50 mcg Intravenous Given 01/19/19 0027)  labetalol (NORMODYNE) injection 20 mg (20 mg Intravenous Given 01/19/19 0045)  labetalol (NORMODYNE) injection 40 mg (40 mg Intravenous Given 01/19/19 0055)  fentaNYL (SUBLIMAZE) injection 50 mcg (50 mcg Intravenous Given 01/19/19 0246)    Mobility walks Low fall risk   Focused Assessments Cardiac Assessment Handoff:  Cardiac Rhythm: Normal sinus rhythm Lab Results  Component Value Date   TROPONINI 0.03 (HH) 01/18/2019   Lab Results  Component Value Date   DDIMER >20.00 (H) 01/18/2019   Does the Patient currently have chest pain? Yes     R Recommendations: See Admitting Provider Note  Report given to:   Additional Notes:   Presently still on  esmolol & nitro drips. Will transition to labetalol drip as soon as it arrives from pharmacy.

## 2019-01-20 ENCOUNTER — Inpatient Hospital Stay (HOSPITAL_COMMUNITY): Payer: 59

## 2019-01-20 DIAGNOSIS — R079 Chest pain, unspecified: Secondary | ICD-10-CM

## 2019-01-20 LAB — ECHOCARDIOGRAM LIMITED
Height: 66 in
Weight: 2638.47 oz

## 2019-01-20 MED ORDER — HYDRALAZINE HCL 20 MG/ML IJ SOLN
10.0000 mg | INTRAMUSCULAR | Status: DC | PRN
  Administered 2019-01-21 – 2019-01-25 (×11): 10 mg via INTRAVENOUS
  Filled 2019-01-20 (×11): qty 1

## 2019-01-20 MED ORDER — AMLODIPINE BESYLATE 10 MG PO TABS
10.0000 mg | ORAL_TABLET | Freq: Every day | ORAL | Status: DC
  Administered 2019-01-20 – 2019-01-21 (×2): 10 mg via ORAL
  Filled 2019-01-20 (×2): qty 1

## 2019-01-20 MED ORDER — NICOTINE 14 MG/24HR TD PT24: 14.0000 mg | MEDICATED_PATCH | Freq: Every day | TRANSDERMAL | Status: DC | PRN

## 2019-01-20 MED ORDER — METOPROLOL TARTRATE 50 MG PO TABS
50.0000 mg | ORAL_TABLET | Freq: Two times a day (BID) | ORAL | Status: DC
  Administered 2019-01-20 – 2019-01-21 (×3): 50 mg via ORAL
  Filled 2019-01-20 (×3): qty 1

## 2019-01-20 NOTE — Progress Notes (Addendum)
    Subjective  -   Chest pain is better but not completely resolved Mild SOB No abd pain   Physical Exam:  abd soft and non-tender Non labored breathing Palpable pedal pulses       Assessment/Plan:    Acute mild SOB:  Will check CXR this am to r/o effusion Malignant hypertension:  BP controlled with IV meds.  Will start PO meds this am GI:  Resume regular diet Appreciate cardiology assistance Keep in ICU 1 more day Will need repeat CTA later this week Acute renal insufficiency:  Creatinine elevated again this am.  Continue IVF hydration.  likley related to IV contrast.  Repeat labs in am  Wells Shanyah Gattuso 01/20/2019 8:23 AM --  Vitals:   01/20/19 0757 01/20/19 0800  BP:  132/67  Pulse:  74  Resp:  (!) 24  Temp: 98.8 F (37.1 C)   SpO2:  98%    Intake/Output Summary (Last 24 hours) at 01/20/2019 0823 Last data filed at 01/20/2019 0800 Gross per 24 hour  Intake 2947.73 ml  Output 1925 ml  Net 1022.73 ml     Laboratory CBC    Component Value Date/Time   WBC 12.0 (H) 01/19/2019 0546   HGB 11.8 (L) 01/19/2019 0546   HCT 35.3 (L) 01/19/2019 0546   PLT 206 01/19/2019 0546    BMET    Component Value Date/Time   NA 135 01/19/2019 1819   K 4.1 01/19/2019 1819   CL 107 01/19/2019 1819   CO2 21 (L) 01/19/2019 1819   GLUCOSE 139 (H) 01/19/2019 1819   BUN 28 (H) 01/19/2019 1819   CREATININE 2.14 (H) 01/19/2019 1819   CALCIUM 8.6 (L) 01/19/2019 1819   GFRNONAA 25 (L) 01/19/2019 1819   GFRAA 28 (L) 01/19/2019 1819    COAG No results found for: INR, PROTIME No results found for: PTT  Antibiotics Anti-infectives (From admission, onward)   None     Critical care time was 20 minutes  V. Charlena Cross, M.D., [facility] [place] Office: [phone] Pager:  [phone]

## 2019-01-20 NOTE — Progress Notes (Signed)
  Echocardiogram 2D Echocardiogram has been performed.  Gerda Diss 01/20/2019, 2:50 PM

## 2019-01-20 NOTE — Progress Notes (Signed)
Progress Note  Patient Name: Heather Pollard Date of Encounter: 01/20/2019  Primary Cardiologist: Tonny Bollman, MD   Subjective   Feeling better today.  A little difficulty with deep breaths, but no dyspnea.  States her chest feels sore but much less pain today.  Inpatient Medications    Scheduled Meds:  amLODipine  5 mg Oral Daily   Chlorhexidine Gluconate Cloth  6 each Topical Daily   docusate sodium  100 mg Oral BID   mupirocin ointment  1 application Nasal BID   pneumococcal 23 valent vaccine  0.5 mL Intramuscular Tomorrow-1000   Continuous Infusions:  dextrose 5 % and 0.45% NaCl 20 mL/hr at 01/20/19 0600   famotidine (PEPCID) IV Stopped (01/19/19 1126)   labetalol (NORMODYNE) infusion 1.5 mg/min (01/20/19 0600)   PRN Meds: acetaminophen **OR** acetaminophen, alum & mag hydroxide-simeth, guaiFENesin-dextromethorphan, HYDROmorphone (DILAUDID) injection, labetalol, phenol, promethazine   Vital Signs    Vitals:   01/20/19 0330 01/20/19 0345 01/20/19 0400 01/20/19 0620  BP: 123/67 125/60    Pulse: 71 74    Resp: 17 17    Temp:   98.9 F (37.2 C)   TempSrc:   Oral   SpO2: 92% 92%    Weight:    74.8 kg  Height:        Intake/Output Summary (Last 24 hours) at 01/20/2019 8469 Last data filed at 01/20/2019 0610 Gross per 24 hour  Intake 3136.08 ml  Output 1925 ml  Net 1211.08 ml   Last 3 Weights 01/20/2019 01/18/2019 05/13/2014  Weight (lbs) 164 lb 14.5 oz 140 lb 131 lb  Weight (kg) 74.8 kg 63.504 kg 59.421 kg      Telemetry    Normal sinus rhythm - Personally Reviewed   Physical Exam  Alert, oriented woman in no distress GEN: No acute distress.   Neck: No JVD Cardiac: RRR, no murmurs, rubs, or gallops.  Respiratory: Clear to auscultation bilaterally. GI: Soft, nontender, non-distended  MS: No edema; No deformity.  Feet are warm with 2+ DP pulses bilaterally Neuro:  Nonfocal  Psych: Normal affect   Labs    Chemistry Recent Labs  Lab  01/18/19 2339 01/18/19 2342 01/19/19 0546 01/19/19 1819  NA 140  --  138 135  K 3.6  --  4.4 4.1  CL 107  --  105 107  CO2 18*  --  21* 21*  GLUCOSE 141*  --  192* 139*  BUN 25*  --  26* 28*  CREATININE 1.94* 1.80* 2.02* 2.14*  CALCIUM 9.3  --  8.7* 8.6*  PROT 7.3  --  6.1*  --   ALBUMIN 3.7  --  3.1*  --   AST 42*  --  35  --   ALT 31  --  27  --   ALKPHOS 86  --  73  --   BILITOT 0.8  --  0.6  --   GFRNONAA 28*  --  26* 25*  GFRAA 32*  --  31* 28*  ANIONGAP 15  --  12 7     Hematology Recent Labs  Lab 01/18/19 2339 01/19/19 0546  WBC 11.6* 12.0*  RBC 4.46 3.93  HGB 13.4 11.8*  HCT 39.1 35.3*  MCV 87.7 89.8  MCH 30.0 30.0  MCHC 34.3 33.4  RDW 12.5 12.5  PLT 241 206    Cardiac Enzymes Recent Labs  Lab 01/18/19 2339 01/19/19 0546 01/19/19 0948  TROPONINI 0.03* 0.09* 0.08*   No results for input(s): TROPIPOC in  the last 168 hours.   BNP Recent Labs  Lab 01/18/19 2339  BNP 71.7     DDimer  Recent Labs  Lab 01/18/19 2339  DDIMER >20.00*     Radiology    Dg Chest Portable 1 View  Result Date: 01/18/2019 CLINICAL DATA:  Chest pain and shortness of breath EXAM: PORTABLE CHEST 1 VIEW COMPARISON:  05/30/2005 FINDINGS: The heart size and mediastinal contours are within normal limits. Both lungs are clear. The visualized skeletal structures are unremarkable. IMPRESSION: No active disease. Electronically Signed   By: Alcide Clever M.D.   On: 01/18/2019 23:41   Ct Angio Chest/abd/pel For Dissection W And/or Wo Contrast  Result Date: 01/19/2019 CLINICAL DATA:  Chest pain, back pain, shortness of breath. EXAM: CT ANGIOGRAPHY CHEST, ABDOMEN AND PELVIS TECHNIQUE: Multidetector CT imaging through the chest, abdomen and pelvis was performed using the standard protocol during bolus administration of intravenous contrast. Multiplanar reconstructed images and MIPs were obtained and reviewed to evaluate the vascular anatomy. CONTRAST:  OMNIPAQUE IOHEXOL 350 MG/ML  SOLN COMPARISON:  None. FINDINGS: CTA CHEST FINDINGS Cardiovascular: There is a type B aortic dissection beginning just beyond the origin of the left subclavian artery. The false lumen within the chest is predominantly thrombosed. Great vessels are patent and uninvolved. Aortic atherosclerosis. Ascending thoracic aorta normal caliber and uninvolved. Heart is normal size. Mediastinum/Nodes: No mediastinal, hilar, or axillary adenopathy. Lungs/Pleura: Lungs are clear. No focal airspace opacities or suspicious nodules. No effusions. Musculoskeletal: Chest wall soft tissues are unremarkable. No acute bony abnormality. Review of the MIP images confirms the above findings. CTA ABDOMEN AND PELVIS FINDINGS VASCULAR Aorta: The dissection continues through the abdominal aorta with contrast opacification noted in the false lumen within the abdomen. The celiac artery and superior mesenteric artery arise from the true lumen. The inferior mesenteric artery arises from the false lumen. All are patent. Both renal arteries appear to arise from the true lumen. 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 Celiac: Patent, arising from the true lumen. SMA: Patent, arising from the true lumen. Renals: Patent, arising from the true lumen. IMA: Patent, arising from the false lumen. Inflow: Dissection continues into both common iliac arteries, terminating on the left above the common iliac bifurcation. This continues on the right into the proximal right external iliac artery. Iliofemoral vessels are patent. There is abnormal soft tissue surrounding the distal left common iliac artery and proximal left external and internal iliac arteries. I see no contrast extravasation, but cannot exclude leak. Veins: No obvious venous abnormality within the limitations of this arterial phase study. Review of the MIP images confirms the above findings. NON-VASCULAR Hepatobiliary: No focal hepatic abnormality. Gallbladder  unremarkable. Pancreas: No focal abnormality or ductal dilatation. Spleen: No focal abnormality.  Normal size. Adrenals/Urinary Tract: There is left hydronephrosis and delayed excretion of contrast from the left kidney. The left ureter is dilated to the pelvic brim and the abnormal soft tissue surrounding the left iliac vessels. No visible stones. Adrenal glands and urinary bladder unremarkable. Right kidney unremarkable. Stomach/Bowel: Stomach, large and small bowel grossly unremarkable. Lymphatic: No adenopathy Reproductive: Uterus and adnexa unremarkable.  No mass. Other: No free fluid or free air. Musculoskeletal: No acute bony abnormality. Review of the MIP images confirms the above findings. IMPRESSION: Type B thoracoabdominal aortic dissection. False lumen is thrombosed in the chest, patent in the abdomen, and extends into the right external iliac artery and left common iliac artery. Great vessels are patent and uninvolved.  Abnormal soft tissue surrounds the left iliac vessels and causes obstruction of the left kidney. Cannot exclude leak from the iliac vessel although no active extravasation of contrast is seen. As mentioned above, left hydronephrosis and delayed excretion of contrast from the left kidney. Critical Value/emergent results were called by telephone at the time of interpretation on 01/19/2019 at 12:40 am to Dr. Glynn Octave , who verbally acknowledged these results. Electronically Signed   By: Charlett Nose M.D.   On: 01/19/2019 00:40   Vas US Carotid  Result Date: 01/19/2019 Carotid Arterial Duplex Study Indications: Pre-surgical evaluation. Type B aortic dissection. Performing Technologist: Gertie Fey MHA, RDMS, RVT, RDCS  Examination Guidelines: A complete evaluation includes B-mode imaging, spectral Doppler, color Doppler, and power Doppler as needed of all accessible portions of each vessel. Bilateral testing is considered an integral part of a complete examination. Limited  examinations for reoccurring indications may be performed as noted.  Right Carotid Findings: +----------+--------+-------+--------+----------------------+------------------+             PSV cm/s EDV     Stenosis Describe               Comments                                 cm/s                                                        +----------+--------+-------+--------+----------------------+------------------+  CCA Prox   68       9                                       intimal thickening  +----------+--------+-------+--------+----------------------+------------------+  CCA Distal 60       15                                                          +----------+--------+-------+--------+----------------------+------------------+  ICA Prox   49       16               smooth and                                                                       heterogenous                               +----------+--------+-------+--------+----------------------+------------------+  ICA Distal 67       21                                                          +----------+--------+-------+--------+----------------------+------------------+  ECA        159      17                                                          +----------+--------+-------+--------+----------------------+------------------+ +----------+--------+-------+----------------+-------------------+             PSV cm/s EDV cms Describe         Arm Pressure (mmHG)  +----------+--------+-------+----------------+-------------------+  Subclavian 153              Multiphasic, WNL                      +----------+--------+-------+----------------+-------------------+ +---------+--------+--+--------+--+---------+  Vertebral PSV cm/s 57 EDV cm/s 18 Antegrade  +---------+--------+--+--------+--+---------+  Left Carotid Findings: +----------+--------+--------+--------+-----------------------+--------+             PSV cm/s EDV cm/s Stenosis Describe                 Comments  +----------+--------+--------+--------+-----------------------+--------+  CCA Prox   106      16                smooth and homogeneous            +----------+--------+--------+--------+-----------------------+--------+  CCA Distal 118      14                smooth and homogeneous            +----------+--------+--------+--------+-----------------------+--------+  ICA Prox   79       13                smooth and heterogenous           +----------+--------+--------+--------+-----------------------+--------+  ICA Distal 99       30                                                  +----------+--------+--------+--------+-----------------------+--------+  ECA        169      16                                                  +----------+--------+--------+--------+-----------------------+--------+ +----------+--------+--------+----------------+-------------------+  Subclavian PSV cm/s EDV cm/s Describe         Arm Pressure (mmHG)  +----------+--------+--------+----------------+-------------------+             289               Multiphasic, WNL                      +----------+--------+--------+----------------+-------------------+ +---------+--------+---+--------+--+---------+  Vertebral PSV cm/s 123 EDV cm/s 25 Antegrade  +---------+--------+---+--------+--+---------+  Summary: Right Carotid: Velocities in the right ICA are consistent with a 1-39% stenosis.                 There is a mobile echogenic linear structure in the right distal                CCA, with flow only noted posterior to the structure. This  is                suggestive of artifact versus dissection. Further imaging may be                warranted if clinically indicated. Left Carotid: Velocities in the left ICA are consistent with a 1-39% stenosis.  *See table(s) above for measurements and observations.  Electronically signed by Waverly Ferrarihristopher Dickson MD on 01/19/2019 at 5:46:35 PM.    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 Increasing              creatinine. Limitations: Air/bowel gas and patient body habitus. Just prior to exam patient was vomiting. Performing Technologist: Gertie FeyMichelle Simonetti MHA, RDMS, RVT, RDCS  Examination Guidelines: A complete evaluation includes B-mode imaging, spectral Doppler, color Doppler, and power Doppler as needed of all accessible portions of each vessel. Bilateral testing is considered an integral part of a complete examination. Limited examinations for reoccurring indications may be performed as noted.  Duplex Findings: +----------------------+--------+--------+------+--------+  Mesenteric             PSV cm/s EDV cm/s Plaque Comments  +----------------------+--------+--------+------+--------+  Aorta Prox               150       21                     +----------------------+--------+--------+------+--------+  Celiac Artery Proximal   242       62                     +----------------------+--------+--------+------+--------+  SMA Proximal             151       15                     +----------------------+--------+--------+------+--------+  +------------------+--------+--------+-------------------+  Right Renal Artery PSV cm/s EDV cm/s       Comment        +------------------+--------+--------+-------------------+  Origin                93       14                         +------------------+--------+--------+-------------------+  Proximal                             Unable to visualize  +------------------+--------+--------+-------------------+  Mid                   68       13                         +------------------+--------+--------+-------------------+  Distal                42       9                          +------------------+--------+--------+-------------------+ +-----------------+--------+--------+-------------------+  Left Renal Artery PSV cm/s EDV cm/s       Comment         +-----------------+--------+--------+-------------------+  Origin              156  20                         +-----------------+--------+--------+-------------------+  Proximal                            Unable to visualize  +-----------------+--------+--------+-------------------+  Mid                                 Unable to visualize  +-----------------+--------+--------+-------------------+  Distal               37       7                          +-----------------+--------+--------+-------------------+ +-----------+--------+--------+----+----------+--------+--------+--------------+  Right       PSV cm/s EDV cm/s RI   Left       PSV cm/s EDV cm/s RI               Kidney                             Kidney                                       +-----------+--------+--------+----+----------+--------+--------+--------------+  Upper Pole  13       4        0.69 Upper Pole 9        3        0.64            +-----------+--------+--------+----+----------+--------+--------+--------------+  Mid         30       10       0.67 Mid        19       5        0.74            +-----------+--------+--------+----+----------+--------+--------+--------------+  Lower Pole  11       4        0.62 Lower Pole                   Unable to                                                                        visualize       +-----------+--------+--------+----+----------+--------+--------+--------------+  Hilar       68       20       0.70 Hilar      22       8        0.65            +-----------+--------+--------+----+----------+--------+--------+--------------+ +------------------+---------+------------------+---------+  Right Kidney                 Left Kidney                   +------------------+---------+------------------+---------+  RAR  0.62      RAR                1.04       +------------------+---------+------------------+---------+  Cortex             12/5 cm/s Cortex             11/4 cm/s   +------------------+---------+------------------+---------+  Kidney length (cm) 7.97      Kidney length (cm) 9.30       +------------------+---------+------------------+---------+  Summary: Renal:  Right: 1-59% stenosis of the right renal artery. Left:  1-59% stenosis of the left renal artery. Technically limited        due to overlying bowel gas and patient body habitus. Aorta: There is a mobile echogenic structure at posterior wall of the mid aorta, suggestive of possible dissection flap versus artifact. Additionally, there is a heterogenous area at posterior wall of the mid aorta, suggestive of possibly thrombosed false lumen.  *See table(s) above for measurements and observations.  Diagnosing physician: Waverly Ferrari MD  Electronically signed by Waverly Ferrari MD on 01/19/2019 at 5:46:22 PM.    Final     Cardiac Studies   2D Echo pending  Renal Arterial Doppler: Summary: Renal:   Right: 1-59% stenosis of the right renal artery. Left:  1-59% stenosis of the left renal artery. Technically limited        due to overlying bowel gas and patient body habitus. Aorta: There is a mobile echogenic structure at posterior wall of the mid aorta, suggestive of possible dissection flap versus artifact. Additionally, there is a heterogenous area at posterior wall of the mid aorta, suggestive of possibly thrombosed false lumen.  Carotid Doppler: Summary: Right Carotid: Velocities in the right ICA are consistent with a 1-39% stenosis.                  There is a mobile echogenic linear structure in the right distal                CCA, with flow only noted posterior to the structure. This is                suggestive of artifact versus dissection. Further imaging may be                warranted if clinically indicated.  Left Carotid: Velocities in the left ICA are consistent with a 1-39% stenosis.  Patient Profile     58 y.o. female presents with hypertensive emergency and type B aortic  dissection  Assessment & Plan    1. Acute Type B aortic dissection: management per Dr Darrick Penna  2. Hypertensive emergency: BP well-controlled on IV labetalol.  She received metoprolol 50 mg this morning.  Will write for her to get amlodipine 10 mg this morning as well.  After she receives amlodipine, would try to start weaning her off of IV labetalol.  Will need to continue close titration of her medications.  Can add hydralazine as third drug if needed.  3. AKI: Avoid ACE/ARB, trend creatinine. Maintaining good urine output. Renal arterial doppler demonstrates patent BL renal arteries.  4. Troponin elevation: minimal elevation, flat trend. Likely demand ischemia in setting of dissection/severe HTN.  Order 2D echo.  For questions or updates, please contact [facility] Please consult [email or web address] for contact info under     Signed, Tonny Bollman, MD  01/20/2019, 6:33 AM

## 2019-01-21 LAB — BASIC METABOLIC PANEL
Anion gap: 9 (ref 5–15)
BUN: 18 mg/dL (ref 6–20)
CO2: 18 mmol/L — ABNORMAL LOW (ref 22–32)
Calcium: 8.5 mg/dL — ABNORMAL LOW (ref 8.9–10.3)
Chloride: 109 mmol/L (ref 98–111)
Creatinine, Ser: 2.03 mg/dL — ABNORMAL HIGH (ref 0.44–1.00)
GFR calc Af Amer: 30 mL/min — ABNORMAL LOW (ref >60)
GFR calc non Af Amer: 26 mL/min — ABNORMAL LOW (ref >60)
Glucose, Bld: 128 mg/dL — ABNORMAL HIGH (ref 70–99)
Potassium: 3.7 mmol/L (ref 3.5–5.1)
Sodium: 136 mmol/L (ref 135–145)

## 2019-01-21 MED ORDER — CARVEDILOL 12.5 MG PO TABS
12.5000 mg | ORAL_TABLET | Freq: Two times a day (BID) | ORAL | Status: DC
  Administered 2019-01-21 – 2019-01-24 (×6): 12.5 mg via ORAL
  Filled 2019-01-21 (×6): qty 1

## 2019-01-21 MED ORDER — ASPIRIN EC 81 MG PO TBEC
81.0000 mg | DELAYED_RELEASE_TABLET | Freq: Every day | ORAL | Status: DC
  Administered 2019-01-21 – 2019-01-25 (×5): 81 mg via ORAL
  Filled 2019-01-21 (×5): qty 1

## 2019-01-21 MED ORDER — FAMOTIDINE 20 MG PO TABS
20.0000 mg | ORAL_TABLET | Freq: Every day | ORAL | Status: DC
  Administered 2019-01-22 – 2019-01-25 (×4): 20 mg via ORAL
  Filled 2019-01-21 (×4): qty 1

## 2019-01-21 NOTE — Progress Notes (Signed)
Progress Note  Patient Name: Heather Pollard Date of Encounter: 01/21/2019  Primary Cardiologist: Tonny Bollman, MD   Subjective   Denies SOB. CP essentially resolved. . Off IV labetalol since 815a. SBP 100-110 HR 60-70s   Inpatient Medications    Scheduled Meds:  amLODipine  10 mg Oral Daily   aspirin EC  81 mg Oral Daily   Chlorhexidine Gluconate Cloth  6 each Topical Daily   docusate sodium  100 mg Oral BID   metoprolol tartrate  50 mg Oral BID   mupirocin ointment  1 application Nasal BID   Continuous Infusions:  dextrose 5 % and 0.45% NaCl 100 mL/hr at 01/21/19 1200   famotidine (PEPCID) IV Stopped (01/21/19 0946)   labetalol (NORMODYNE) infusion Stopped (01/21/19 0811)   PRN Meds: acetaminophen **OR** acetaminophen, alum & mag hydroxide-simeth, guaiFENesin-dextromethorphan, hydrALAZINE, HYDROmorphone (DILAUDID) injection, labetalol, nicotine, phenol, promethazine   Vital Signs    Vitals:   01/21/19 1053 01/21/19 1100 01/21/19 1200 01/21/19 1300  BP:  103/61 105/69 (!) 127/58  Pulse:  72 72 77  Resp:  20 17   Temp: 98.2 F (36.8 C)     TempSrc: Oral     SpO2:  96% 96% 97%  Weight:      Height:        Intake/Output Summary (Last 24 hours) at 01/21/2019 1426 Last data filed at 01/21/2019 1300 Gross per 24 hour  Intake 3036.78 ml  Output 3650 ml  Net -613.22 ml   Last 3 Weights 01/20/2019 01/18/2019 05/13/2014  Weight (lbs) 164 lb 14.5 oz 140 lb 131 lb  Weight (kg) 74.8 kg 63.504 kg 59.421 kg      Telemetry    Normal sinus rhythm - Personally Reviewed   Physical Exam   General:  Lying flat in bed Well appearing. No resp difficulty HEENT: normal Neck: supple. no JVD. Carotids 2+ bilat; no bruits. No lymphadenopathy or thryomegaly appreciated. Cor: PMI nondisplaced. Regular rate & rhythm. No rubs, gallops or murmurs. Lungs: clear Abdomen: soft, nontender, nondistended. No hepatosplenomegaly. No bruits or masses. Good bowel sounds. Extremities:  no cyanosis, clubbing, rash, edema Neuro: alert & orientedx3, cranial nerves grossly intact. moves all 4 extremities w/o difficulty. Affect pleasant   Labs    Chemistry Recent Labs  Lab 01/18/19 2339  01/19/19 0546 01/19/19 1819 01/21/19 1034  NA 140  --  138 135 136  K 3.6  --  4.4 4.1 3.7  CL 107  --  105 107 109  CO2 18*  --  21* 21* 18*  GLUCOSE 141*  --  192* 139* 128*  BUN 25*  --  26* 28* 18  CREATININE 1.94*   < > 2.02* 2.14* 2.03*  CALCIUM 9.3  --  8.7* 8.6* 8.5*  PROT 7.3  --  6.1*  --   --   ALBUMIN 3.7  --  3.1*  --   --   AST 42*  --  35  --   --   ALT 31  --  27  --   --   ALKPHOS 86  --  73  --   --   BILITOT 0.8  --  0.6  --   --   GFRNONAA 28*  --  26* 25* 26*  GFRAA 32*  --  31* 28* 30*  ANIONGAP 15  --  < > = values in this interval not displayed.     Hematology Recent Labs  Lab 01/18/19  2339 01/19/19 0546  WBC 11.6* 12.0*  RBC 4.46 3.93  HGB 13.4 11.8*  HCT 39.1 35.3*  MCV 87.7 89.8  MCH 30.0 30.0  MCHC 34.3 33.4  RDW 12.5 12.5  PLT 241 206    Cardiac Enzymes Recent Labs  Lab 01/18/19 2339 01/19/19 0546 01/19/19 0948  TROPONINI 0.03* 0.09* 0.08*   No results for input(s): TROPIPOC in the last 168 hours.   BNP Recent Labs  Lab 01/18/19 2339  BNP 71.7     DDimer  Recent Labs  Lab 01/18/19 2339  DDIMER >20.00*     Radiology    Dg Chest Port 1 View  Result Date: 01/20/2019 CLINICAL DATA:  58 year old female with increasing shortness of breath, with acute type B dissection EXAM: PORTABLE CHEST 1 VIEW COMPARISON:  01/18/2019, CT 01/19/2019 FINDINGS: The diameter of the mediastinum measured at the carina was previously 8.1 cm on chest x-ray, currently 9.5 cm on chest x-ray. Questionable blunting at the cardiophrenic angle at the left diaphragm with questionable widening of the aortic silhouette at the hiatus. No pneumothorax. No apically capping. No confluent airspace disease. Heart diameter unchanged. No edema.  IMPRESSION: The mediastinal diameter measures larger than the comparison chest x-ray at the level of the carina, though this may be projectional. If there is concern for complicating features such as retrograde extension, repeat CTA may be considered. No overt edema or new airspace disease. Electronically Signed   By: Gilmer Mor D.O.   On: 01/20/2019 09:36   Vas US Carotid  Result Date: 01/19/2019 Carotid Arterial Duplex Study Indications: Pre-surgical evaluation. Type B aortic dissection. Performing Technologist: Gertie Fey MHA, RDMS, RVT, RDCS  Examination Guidelines: A complete evaluation includes B-mode imaging, spectral Doppler, color Doppler, and power Doppler as needed of all accessible portions of each vessel. Bilateral testing is considered an integral part of a complete examination. Limited examinations for reoccurring indications may be performed as noted.  Right Carotid Findings: +----------+--------+-------+--------+----------------------+------------------+             PSV cm/s EDV     Stenosis Describe               Comments                                 cm/s                                                        +----------+--------+-------+--------+----------------------+------------------+  CCA Prox   68       9                                       intimal thickening  +----------+--------+-------+--------+----------------------+------------------+  CCA Distal 60       15                                                          +----------+--------+-------+--------+----------------------+------------------+  ICA Prox   49       16  smooth and                                                                       heterogenous                               +----------+--------+-------+--------+----------------------+------------------+  ICA Distal 67       21                                                           +----------+--------+-------+--------+----------------------+------------------+  ECA        159      17                                                          +----------+--------+-------+--------+----------------------+------------------+ +----------+--------+-------+----------------+-------------------+             PSV cm/s EDV cms Describe         Arm Pressure (mmHG)  +----------+--------+-------+----------------+-------------------+  Subclavian 153              Multiphasic, WNL                      +----------+--------+-------+----------------+-------------------+ +---------+--------+--+--------+--+---------+  Vertebral PSV cm/s 57 EDV cm/s 18 Antegrade  +---------+--------+--+--------+--+---------+  Left Carotid Findings: +----------+--------+--------+--------+-----------------------+--------+             PSV cm/s EDV cm/s Stenosis Describe                Comments  +----------+--------+--------+--------+-----------------------+--------+  CCA Prox   106      16                smooth and homogeneous            +----------+--------+--------+--------+-----------------------+--------+  CCA Distal 118      14                smooth and homogeneous            +----------+--------+--------+--------+-----------------------+--------+  ICA Prox   79       13                smooth and heterogenous           +----------+--------+--------+--------+-----------------------+--------+  ICA Distal 99       30                                                  +----------+--------+--------+--------+-----------------------+--------+  ECA        169      16                                                  +----------+--------+--------+--------+-----------------------+--------+ +----------+--------+--------+----------------+-------------------+  Subclavian PSV cm/s EDV cm/s Describe         Arm Pressure (mmHG)  +----------+--------+--------+----------------+-------------------+             289               Multiphasic, WNL                       +----------+--------+--------+----------------+-------------------+ +---------+--------+---+--------+--+---------+  Vertebral PSV cm/s 123 EDV cm/s 25 Antegrade  +---------+--------+---+--------+--+---------+  Summary: Right Carotid: Velocities in the right ICA are consistent with a 1-39% stenosis.                 There is a mobile echogenic linear structure in the right distal                CCA, with flow only noted posterior to the structure. This is                suggestive of artifact versus dissection. Further imaging may be                warranted if clinically indicated. Left Carotid: Velocities in the left ICA are consistent with a 1-39% stenosis.  *See table(s) above for measurements and observations.  Electronically signed by Waverly Ferrari MD on 01/19/2019 at 5:46:35 PM.    Final    Vas US Renal Artery Duplex  Result Date: 01/19/2019 ABDOMINAL VISCERAL Indications: Dissecting aneurysm of thoracic aorta, Stanford type B. Increasing              creatinine. Limitations: Air/bowel gas and patient body habitus. Just prior to exam patient was vomiting. Performing Technologist: Gertie Fey MHA, RDMS, RVT, RDCS  Examination Guidelines: A complete evaluation includes B-mode imaging, spectral Doppler, color Doppler, and power Doppler as needed of all accessible portions of each vessel. Bilateral testing is considered an integral part of a complete examination. Limited examinations for reoccurring indications may be performed as noted.  Duplex Findings: +----------------------+--------+--------+------+--------+  Mesenteric             PSV cm/s EDV cm/s Plaque Comments  +----------------------+--------+--------+------+--------+  Aorta Prox               150       21                     +----------------------+--------+--------+------+--------+  Celiac Artery Proximal   242       62                     +----------------------+--------+--------+------+--------+  SMA Proximal             151        15                     +----------------------+--------+--------+------+--------+  +------------------+--------+--------+-------------------+  Right Renal Artery PSV cm/s EDV cm/s       Comment        +------------------+--------+--------+-------------------+  Origin                93       14                         +------------------+--------+--------+-------------------+  Proximal                             Unable to visualize  +------------------+--------+--------+-------------------+  Mid                   68       13                         +------------------+--------+--------+-------------------+  Distal                42       9                          +------------------+--------+--------+-------------------+ +-----------------+--------+--------+-------------------+  Left Renal Artery PSV cm/s EDV cm/s       Comment        +-----------------+--------+--------+-------------------+  Origin              156       20                         +-----------------+--------+--------+-------------------+  Proximal                            Unable to visualize  +-----------------+--------+--------+-------------------+  Mid                                 Unable to visualize  +-----------------+--------+--------+-------------------+  Distal               37       7                          +-----------------+--------+--------+-------------------+ +-----------+--------+--------+----+----------+--------+--------+--------------+  Right       PSV cm/s EDV cm/s RI   Left       PSV cm/s EDV cm/s RI               Kidney                             Kidney                                       +-----------+--------+--------+----+----------+--------+--------+--------------+  Upper Pole  13       4        0.69 Upper Pole 9        3        0.64            +-----------+--------+--------+----+----------+--------+--------+--------------+  Mid         30       10       0.67 Mid        19       5        0.74             +-----------+--------+--------+----+----------+--------+--------+--------------+  Lower Pole  11       4        0.62 Lower Pole                   Unable to  visualize       +-----------+--------+--------+----+----------+--------+--------+--------------+  Hilar       68       20       0.70 Hilar      22       8        0.65            +-----------+--------+--------+----+----------+--------+--------+--------------+ +------------------+---------+------------------+---------+  Right Kidney                 Left Kidney                   +------------------+---------+------------------+---------+  RAR                0.62      RAR                1.04       +------------------+---------+------------------+---------+  Cortex             12/5 cm/s Cortex             11/4 cm/s  +------------------+---------+------------------+---------+  Kidney length (cm) 7.97      Kidney length (cm) 9.30       +------------------+---------+------------------+---------+  Summary: Renal:  Right: 1-59% stenosis of the right renal artery. Left:  1-59% stenosis of the left renal artery. Technically limited        due to overlying bowel gas and patient body habitus. Aorta: There is a mobile echogenic structure at posterior wall of the mid aorta, suggestive of possible dissection flap versus artifact. Additionally, there is a heterogenous area at posterior wall of the mid aorta, suggestive of possibly thrombosed false lumen.  *See table(s) above for measurements and observations.  Diagnosing physician: Waverly Ferrari MD  Electronically signed by Waverly Ferrari MD on 01/19/2019 at 5:46:22 PM.    Final     Cardiac Studies   2D Echo pending  Renal Arterial Doppler: Summary: Renal:   Right: 1-59% stenosis of the right renal artery. Left:  1-59% stenosis of the left renal artery. Technically limited        due to overlying bowel gas and patient body  habitus. Aorta: There is a mobile echogenic structure at posterior wall of the mid aorta, suggestive of possible dissection flap versus artifact. Additionally, there is a heterogenous area at posterior wall of the mid aorta, suggestive of possibly thrombosed false lumen.  Carotid Doppler: Summary: Right Carotid: Velocities in the right ICA are consistent with a 1-39% stenosis.                  There is a mobile echogenic linear structure in the right distal                CCA, with flow only noted posterior to the structure. This is                suggestive of artifact versus dissection. Further imaging may be                warranted if clinically indicated.  Left Carotid: Velocities in the left ICA are consistent with a 1-39% stenosis.  Patient Profile     58 y.o. female presents with hypertensive emergency and type B aortic dissection  Assessment & Plan    1. Acute Type B aortic dissection:  - stable management per Dr Darrick Penna - planning to repeat CTA later this week   2. Hypertensive emergency:  - BP now well-controlled on po meds  -  I would prefer to have her HR consistently < 70 to reduce shear forces  - Will switch lopressor to carvedilol 12.5 bid - Can cut down amlodpine as needed (willplace hold parameters for SBP < 100   3. AKI on CKD 3:  - creatinine stable 2.0-2.1 - in 8/15 was 1.7 so suspect this is not far from baseline - Renal arterial doppler demonstrates patent BL renal arteries. - Avoid ACE/ARB for now   4. Troponin elevation: minimal elevation, flat trend. Likely demand ischemia in setting of dissection/severe HTN.   - Echo with EF 60% no RWMA  - nu further w/u needed currently.   5. Tobacco use - encouraged cessation  For questions or updates, please contact [facility] Please consult [email or web address] for contact info under     Signed, Arvilla Meres, MD  01/21/2019, 2:26 PM

## 2019-01-21 NOTE — Progress Notes (Signed)
  Progress Note    01/21/2019 6:56 PM * No surgery found *  Subjective: Having left shoulder pain  Vitals:   01/21/19 1648 01/21/19 1800  BP: 132/62 91/81  Pulse:  77  Resp:    Temp:    SpO2:  96%    Physical Exam: Awake alert oriented Palpable radial pulses bilaterally Palpable femoral and pedal pulses bilaterally Abdomen is soft nontender  CBC    Component Value Date/Time   WBC 12.0 (H) 01/19/2019 0546   RBC 3.93 01/19/2019 0546   HGB 11.8 (L) 01/19/2019 0546   HCT 35.3 (L) 01/19/2019 0546   PLT 206 01/19/2019 0546   MCV 89.8 01/19/2019 0546   MCH 30.0 01/19/2019 0546   MCHC 33.4 01/19/2019 0546   RDW 12.5 01/19/2019 0546   LYMPHSABS 4.1 (H) 01/18/2019 2339   MONOABS 1.1 (H) 01/18/2019 2339   EOSABS 0.3 01/18/2019 2339   BASOSABS 0.1 01/18/2019 2339    BMET    Component Value Date/Time   NA 136 01/21/2019 1034   K 3.7 01/21/2019 1034   CL 109 01/21/2019 1034   CO2 18 (L) 01/21/2019 1034   GLUCOSE 128 (H) 01/21/2019 1034   BUN 18 01/21/2019 1034   CREATININE 2.03 (H) 01/21/2019 1034   CALCIUM 8.5 (L) 01/21/2019 1034   GFRNONAA 26 (L) 01/21/2019 1034   GFRAA 30 (L) 01/21/2019 1034    INR No results found for: INR   Intake/Output Summary (Last 24 hours) at 01/21/2019 1856 Last data filed at 01/21/2019 1800 Gross per 24 hour  Intake 2945.67 ml  Output 3750 ml  Net -804.33 ml     Assessment:  58 y.o. female is here with acute type B aortic dissection and acute kidney injury.  Cardiology following blood pressure is better controlled today on p.o. meds.  Creatinine appears to have peaked  Plan: Continue ICU care today will evaluate for transfer tomorrow if blood pressure remains controlled on oral meds only Appreciate cardiology evaluation and treatment of patient Would be beneficial to get repeat CT angios prior to discharge.  She also had concern for plaque in her right  internal carotid artery as well as in the left common carotid artery.  This may  require CT Angie of her neck at some point although with her elevated creatinine may be limited.   No indication for surgical intervention at this time.   Suzi Hernan C. Randie Heinz, MD [facility] Office: [phone] Pager: [phone]  01/21/2019 6:56 PM

## 2019-01-21 NOTE — Progress Notes (Signed)
Pt rec'd PRN RX for pain x 2 this shift (IV x 1, PO x 1) effective. Labetalol at 0.5mg /min when care assumed, titrated during shift to MD parameter. Max of 2 mg/min utilized during shift, current rate 0.5mg /min secondary to PRN hydralazine x 2. Telemetry continues SR 1st degree block.

## 2019-01-22 MED ORDER — AMLODIPINE BESYLATE 5 MG PO TABS
5.0000 mg | ORAL_TABLET | Freq: Every day | ORAL | Status: DC
  Administered 2019-01-22 – 2019-01-23 (×2): 5 mg via ORAL
  Filled 2019-01-22 (×2): qty 1

## 2019-01-22 NOTE — Progress Notes (Signed)
[facility]  Subjective  - minimal chest pain   Objective (!) 123/49 79 98.1 F (36.7 C) (Oral) 17 96%  Intake/Output Summary (Last 24 hours) at 01/22/2019 0943 Last data filed at 01/22/2019 0900 Gross per 24 hour  Intake 2919.63 ml  Output 3401 ml  Net -481.37 ml   Abdomen soft non tender 2+ femoral and DP pulses  Assessment/Planning: Type B dissection BP improving  Still need slightly lower HR  Carvedilol added yesterday Will transfer to 4E Recheck creatinine tomorrow consider repeat CT if improved Otherwise will d/c home when HR consistently lower than 70 and BP less than 130  Charles Fields 01/22/2019 9:43 AM --  Laboratory Lab Results: No results for input(s): WBC, HGB, HCT, PLT in the last 72 hours. BMET Recent Labs    01/19/19 1819 01/21/19 1034  NA 135 136  K 4.1 3.7  CL 107 109  CO2 21* 18*  GLUCOSE 139* 128*  BUN 28* 18  CREATININE 2.14* 2.03*  CALCIUM 8.6* 8.5*    COAG No results found for: INR, PROTIME No results found for: PTT

## 2019-01-22 NOTE — Progress Notes (Signed)
Progress Note  Patient Name: Heather SporeKathryn Pollard Date of Encounter: 01/22/2019  Primary Cardiologist: Tonny BollmanMichael Bryne Lindon, MD   Subjective   No chest pain.  Has had some back pain overnight but feels like this is just related to being in bed so much.  No shortness of breath.  Inpatient Medications    Scheduled Meds: . amLODipine  10 mg Oral Daily  . aspirin EC  81 mg Oral Daily  . carvedilol  12.5 mg Oral BID WC  . Chlorhexidine Gluconate Cloth  6 each Topical Daily  . docusate sodium  100 mg Oral BID  . famotidine  20 mg Oral Daily  . mupirocin ointment  1 application Nasal BID   Continuous Infusions: . dextrose 5 % and 0.45% NaCl 100 mL/hr at 01/22/19 0900  . labetalol (NORMODYNE) infusion Stopped (01/21/19 0811)   PRN Meds: acetaminophen **OR** acetaminophen, alum & mag hydroxide-simeth, guaiFENesin-dextromethorphan, hydrALAZINE, HYDROmorphone (DILAUDID) injection, labetalol, nicotine, phenol, promethazine   Vital Signs    Vitals:   01/22/19 0700 01/22/19 0759 01/22/19 0800 01/22/19 0900  BP: 117/64  (!) 138/91 (!) 123/49  Pulse: 76  81 79  Resp: (!) 8  17 17   Temp:  98.1 F (36.7 C)    TempSrc:  Oral    SpO2: 96%  95% 96%  Weight:      Height:        Intake/Output Summary (Last 24 hours) at 01/22/2019 0919 Last data filed at 01/22/2019 0900 Gross per 24 hour  Intake 2919.63 ml  Output 3401 ml  Net -481.37 ml   Last 3 Weights 01/20/2019 01/18/2019 05/13/2014  Weight (lbs) 164 lb 14.5 oz 140 lb 131 lb  Weight (kg) 74.8 kg 63.504 kg 59.421 kg      Telemetry    Normal sinus rhythm without arrhythmia - Personally Reviewed   Physical Exam  Alert, oriented woman in no distress GEN: No acute distress.   Neck: No JVD Cardiac: RRR, no murmurs, rubs, or gallops.  Respiratory: Clear to auscultation bilaterally. GI: Soft, nontender, non-distended  MS: No edema; No deformity. Neuro:  Nonfocal  Psych: Normal affect   Labs    Chemistry Recent Labs  Lab 01/18/19 2339   01/19/19 0546 01/19/19 1819 01/21/19 1034  NA 140  --  138 135 136  K 3.6  --  4.4 4.1 3.7  CL 107  --  105 107 109  CO2 18*  --  21* 21* 18*  GLUCOSE 141*  --  192* 139* 128*  BUN 25*  --  26* 28* 18  CREATININE 1.94*   < > 2.02* 2.14* 2.03*  CALCIUM 9.3  --  8.7* 8.6* 8.5*  PROT 7.3  --  6.1*  --   --   ALBUMIN 3.7  --  3.1*  --   --   AST 42*  --  35  --   --   ALT 31  --  27  --   --   ALKPHOS 86  --  73  --   --   BILITOT 0.8  --  0.6  --   --   GFRNONAA 28*  --  26* 25* 26*  GFRAA 32*  --  31* 28* 30*  ANIONGAP 15  --  12 7 9    < > = values in this interval not displayed.     Hematology Recent Labs  Lab 01/18/19 2339 01/19/19 0546  WBC 11.6* 12.0*  RBC 4.46 3.93  HGB 13.4 11.8*  HCT 39.1 35.3*  MCV 87.7 89.8  MCH 30.0 30.0  MCHC 34.3 33.4  RDW 12.5 12.5  PLT 241 206    Cardiac Enzymes Recent Labs  Lab 01/18/19 2339 01/19/19 0546 01/19/19 0948  TROPONINI 0.03* 0.09* 0.08*   No results for input(s): TROPIPOC in the last 168 hours.   BNP Recent Labs  Lab 01/18/19 2339  BNP 71.7     DDimer  Recent Labs  Lab 01/18/19 2339  DDIMER >20.00*     Radiology    No results found.  Cardiac Studies   2D Echo: IMPRESSIONS    1. The left ventricle has normal systolic function, with an ejection fraction of 60-65%. The cavity size was normal. There is mildly increased left ventricular wall thickness. No evidence of left ventricular regional wall motion abnormalities.  2. The right ventricle has normal systolc function. The cavity was normal. There is no increase in right ventricular wall thickness.  3. Left atrial size was mildly dilated.  4. Trivial pericardial effusion is present.  5. No evidence of mitral valve stenosis. Trivial mitral regurgitation.  6. The aortic valve is tricuspid Mild calcification of the aortic valve. No stenosis of the aortic valve.  7. The aortic root is normal in size and structure.  8. The IVC was normal in size. No  complete TR doppler jet so unable to estimate PA systolic pressure.  FINDINGS  Left Ventricle: The left ventricle has normal systolic function, with an ejection fraction of 60-65%. The cavity size was normal. There is mildly increased left ventricular wall thickness. No evidence of left ventricular regional wall motion  abnormalities.  Patient Profile     58 y.o. female with hypertensive emergency and type B aortic dissection  Assessment & Plan    1.  Acute type B aortic dissection: Continue current management per Dr. Darrick Penna.  Appears to be stable.  2.  Hypertensive emergency: Blood pressure greatly improved now off of all IV infusions and treated with a combination of carvedilol and amlodipine.  Will reduce amlodipine dose to 5 mg daily.  3.  Acute on chronic kidney disease stage III, stable  4.  Tobacco abuse: Cessation counseling done  5.  Minimal troponin elevation: No evidence of ACS.  Suspect demand ischemia in the setting of hypertensive emergency.  No regional wall motion abnormalities on echo.  No further evaluation planned.  Overall the patient appears stable.  I reviewed recommendations today for her antihypertensive medications.  If she goes home over the weekend, seems reasonable to discharge her on carvedilol 12.5 mg twice daily and amlodipine 5 mg daily.  For questions or updates, please contact [facility] Please consult [email or web address] for contact info under     Signed, Tonny Bollman, MD  01/22/2019, 9:19 AM

## 2019-01-22 NOTE — Progress Notes (Signed)
01/22/2019 1150 Receivedtxt to room 4E-10 from 2H.  Pt is A&O, C/O moderate HA.  Tele monitor applied and CCMD notified.  CHG bath given.  Oriented to room, call light and bed.  Call bell in reach. Heather Pollard

## 2019-01-23 DIAGNOSIS — N183 Chronic kidney disease, stage 3 (moderate): Secondary | ICD-10-CM

## 2019-01-23 LAB — BASIC METABOLIC PANEL
Anion gap: 12 (ref 5–15)
BUN: 14 mg/dL (ref 6–20)
CO2: 21 mmol/L — ABNORMAL LOW (ref 22–32)
Calcium: 8.6 mg/dL — ABNORMAL LOW (ref 8.9–10.3)
Chloride: 106 mmol/L (ref 98–111)
Creatinine, Ser: 1.73 mg/dL — ABNORMAL HIGH (ref 0.44–1.00)
GFR calc Af Amer: 37 mL/min — ABNORMAL LOW (ref >60)
GFR calc non Af Amer: 32 mL/min — ABNORMAL LOW (ref >60)
Glucose, Bld: 96 mg/dL (ref 70–99)
Potassium: 3.4 mmol/L — ABNORMAL LOW (ref 3.5–5.1)
Sodium: 139 mmol/L (ref 135–145)

## 2019-01-23 LAB — CBC
HCT: 33.9 % — ABNORMAL LOW (ref 36.0–46.0)
Hemoglobin: 11.6 g/dL — ABNORMAL LOW (ref 12.0–15.0)
MCH: 30.3 pg (ref 26.0–34.0)
MCHC: 34.2 g/dL (ref 30.0–36.0)
MCV: 88.5 fL (ref 80.0–100.0)
Platelets: 210 10*3/uL (ref 150–400)
RBC: 3.83 MIL/uL — ABNORMAL LOW (ref 3.87–5.11)
RDW: 12.9 % (ref 11.5–15.5)
WBC: 10.1 10*3/uL (ref 4.0–10.5)
nRBC: 0 % (ref 0.0–0.2)

## 2019-01-23 MED ORDER — AMLODIPINE BESYLATE 10 MG PO TABS
10.0000 mg | ORAL_TABLET | Freq: Every day | ORAL | Status: DC
  Administered 2019-01-24 – 2019-01-25 (×2): 10 mg via ORAL
  Filled 2019-01-23 (×2): qty 1

## 2019-01-23 MED ORDER — SODIUM CHLORIDE 0.9 % IV SOLN
INTRAVENOUS | Status: DC
  Administered 2019-01-23 – 2019-01-24 (×3): via INTRAVENOUS

## 2019-01-23 MED ORDER — OXYCODONE-ACETAMINOPHEN 5-325 MG PO TABS
2.0000 | ORAL_TABLET | ORAL | Status: DC | PRN
  Administered 2019-01-23 – 2019-01-25 (×8): 2 via ORAL
  Filled 2019-01-23 (×9): qty 2

## 2019-01-23 MED ORDER — AMLODIPINE BESYLATE 5 MG PO TABS
5.0000 mg | ORAL_TABLET | Freq: Once | ORAL | Status: AC
  Administered 2019-01-23: 5 mg via ORAL
  Filled 2019-01-23: qty 1

## 2019-01-23 NOTE — Progress Notes (Addendum)
Patient BP 143/73 Hydralazine 10mg  given as ordered as needed for SPB >140. Will monitor patient. Naoko Diperna Jessup rN   1415 BP after Hydralazine 135/68

## 2019-01-23 NOTE — Progress Notes (Signed)
Progress Note  Patient Name: Heather SporeKathryn Weimann Date of Encounter: 01/23/2019  Primary Cardiologist: Tonny BollmanMichael Cooper, MD   Subjective   Having more pain in lower posterior thorax today. BP higher. No abdominal, neurological or limb-related complaints.  Inpatient Medications    Scheduled Meds: . [START ON 01/24/2019] amLODipine  10 mg Oral Daily  . amLODipine  5 mg Oral Once  . aspirin EC  81 mg Oral Daily  . carvedilol  12.5 mg Oral BID WC  . Chlorhexidine Gluconate Cloth  6 each Topical Daily  . docusate sodium  100 mg Oral BID  . famotidine  20 mg Oral Daily  . mupirocin ointment  1 application Nasal BID   Continuous Infusions: . sodium chloride 50 mL/hr at 01/23/19 0900  . dextrose 5 % and 0.45% NaCl 100 mL/hr at 01/22/19 1100   PRN Meds: acetaminophen **OR** acetaminophen, alum & mag hydroxide-simeth, guaiFENesin-dextromethorphan, hydrALAZINE, HYDROmorphone (DILAUDID) injection, labetalol, nicotine, oxyCODONE-acetaminophen, phenol, promethazine   Vital Signs    Vitals:   01/23/19 0915 01/23/19 1000 01/23/19 1001 01/23/19 1030  BP: (!) 150/80 (!) 141/65  136/76  Pulse:      Resp: 17 14 14 19   Temp:      TempSrc:      SpO2:      Weight:      Height:       No intake or output data in the 24 hours ending 01/23/19 1129 Last 3 Weights 01/23/2019 01/20/2019 01/18/2019  Weight (lbs) 160 lb 164 lb 14.5 oz 140 lb  Weight (kg) 72.576 kg 74.8 kg 63.504 kg      Telemetry    NSR - Personally Reviewed  ECG    NSR, QS V1-V2 - Personally Reviewed  Physical Exam  Appears slightly uncomfortable, but breathing quietly and able to speak in full uninterrupted sentences GEN: No acute distress.   Neck: No JVD Cardiac: RRR, no murmurs, rubs, or gallops. Normal pedal pulses. Respiratory: Clear to auscultation bilaterally. GI: Soft, nontender, non-distended  MS: No edema; No deformity. Neuro:  Nonfocal  Psych: Normal affect   Labs    Chemistry Recent Labs  Lab 01/18/19  2339  01/19/19 0546 01/19/19 1819 01/21/19 1034 01/23/19 0254  NA 140  --  138 135 136 139  K 3.6  --  4.4 4.1 3.7 3.4*  CL 107  --  105 107 109 106  CO2 18*  --  21* 21* 18* 21*  GLUCOSE 141*  --  192* 139* 128* 96  BUN 25*  --  26* 28* 18 14  CREATININE 1.94*   < > 2.02* 2.14* 2.03* 1.73*  CALCIUM 9.3  --  8.7* 8.6* 8.5* 8.6*  PROT 7.3  --  6.1*  --   --   --   ALBUMIN 3.7  --  3.1*  --   --   --   AST 42*  --  35  --   --   --   ALT 31  --  27  --   --   --   ALKPHOS 86  --  73  --   --   --   BILITOT 0.8  --  0.6  --   --   --   GFRNONAA 28*  --  26* 25* 26* 32*  GFRAA 32*  --  31* 28* 30* 37*  ANIONGAP 15  --  12 7 9 12    < > = values in this interval not displayed.  Hematology Recent Labs  Lab 01/18/19 2339 01/19/19 0546 01/23/19 0254  WBC 11.6* 12.0* 10.1  RBC 4.46 3.93 3.83*  HGB 13.4 11.8* 11.6*  HCT 39.1 35.3* 33.9*  MCV 87.7 89.8 88.5  MCH 30.0 30.0 30.3  MCHC 34.3 33.4 34.2  RDW 12.5 12.5 12.9  PLT 241 206 210    Cardiac Enzymes Recent Labs  Lab 01/18/19 2339 01/19/19 0546 01/19/19 0948  TROPONINI 0.03* 0.09* 0.08*   No results for input(s): TROPIPOC in the last 168 hours.   BNP Recent Labs  Lab 01/18/19 2339  BNP 71.7     DDimer  Recent Labs  Lab 01/18/19 2339  DDIMER >20.00*     Radiology    No results found.  Cardiac Studies   ECHO 1. The left ventricle has normal systolic function, with an ejection fraction of 60-65%. The cavity size was normal. There is mildly increased left ventricular wall thickness. No evidence of left ventricular regional wall motion abnormalities. 2. The right ventricle has normal systolc function. The cavity was normal. There is no increase in right ventricular wall thickness. 3. Left atrial size was mildly dilated. 4. Trivial pericardial effusion is present. 5. No evidence of mitral valve stenosis. Trivial mitral regurgitation. 6. The aortic valve is tricuspid Mild calcification of the  aortic valve. No stenosis of the aortic valve. 7. The aortic root is normal in size and structure. 8. The IVC was normal in size. No complete TR doppler jet so unable to estimate PA systolic pressure.  Patient Profile     58 y.o. female with acute type B aortic dissection, HTNive urgency, CKD  3, smoker.  Assessment & Plan    1. Ao dissection type B: continues to have moderate pain, which appears to worsen in parallel with BP. Increase the amlodipine again. 2. HTN: target SBP consistently < 130. 3. CKD 3: at baseline. 4. Smoking:  Recommend cessation permanently.     For questions or updates, please contact [facility] Please consult [email or web address] for contact info under        Signed, Thurmon Fair, MD  01/23/2019, 11:29 AM

## 2019-01-23 NOTE — Progress Notes (Signed)
Patient BP 157/77 and 165/87 consecutively   this AM, Dr Myra Gianotti in the room and orders received. 10 Mg Hydralazine given as ordered as needed for SBP >140. Will continue to monitor patient. Laloni Rowton, Randall An RN

## 2019-01-23 NOTE — Progress Notes (Signed)
Progress Note  Patient Name: Heather Pollard Date of Encounter: 01/23/2019  Primary Cardiologist: Tonny BollmanMichael Cooper, MD   Subjective   No chest pain. She continues to have back pain, but getting good pain management for it. No SOB.  Inpatient Medications    Scheduled Meds: . [START ON 01/24/2019] amLODipine  10 mg Oral Daily  . amLODipine  5 mg Oral Once  . aspirin EC  81 mg Oral Daily  . carvedilol  12.5 mg Oral BID WC  . Chlorhexidine Gluconate Cloth  6 each Topical Daily  . docusate sodium  100 mg Oral BID  . famotidine  20 mg Oral Daily  . mupirocin ointment  1 application Nasal BID   Continuous Infusions: . sodium chloride 50 mL/hr at 01/23/19 0900  . dextrose 5 % and 0.45% NaCl 100 mL/hr at 01/22/19 1100   PRN Meds: acetaminophen **OR** acetaminophen, alum & mag hydroxide-simeth, guaiFENesin-dextromethorphan, hydrALAZINE, HYDROmorphone (DILAUDID) injection, labetalol, nicotine, oxyCODONE-acetaminophen, phenol, promethazine   Vital Signs    Vitals:   01/23/19 0915 01/23/19 1000 01/23/19 1001 01/23/19 1030  BP: (!) 150/80 (!) 141/65  136/76  Pulse:      Resp: 17 14 14 19   Temp:      TempSrc:      SpO2:      Weight:      Height:       No intake or output data in the 24 hours ending 01/23/19 1131 Last 3 Weights 01/23/2019 01/20/2019 01/18/2019  Weight (lbs) 160 lb 164 lb 14.5 oz 140 lb  Weight (kg) 72.576 kg 74.8 kg 63.504 kg      Telemetry    Normal sinus rhythm without arrhythmia - Personally Reviewed   Physical Exam  Alert, oriented woman in no distress GEN: No acute distress.   Neck: No JVD Cardiac: RRR, no murmurs, rubs, or gallops.  Respiratory: Clear to auscultation bilaterally. GI: Soft, nontender, non-distended  MS: No edema; No deformity. Neuro:  Nonfocal  Psych: Normal affect   Labs    Chemistry Recent Labs  Lab 01/18/19 2339  01/19/19 0546 01/19/19 1819 01/21/19 1034 01/23/19 0254  NA 140  --  138 135 136 139  K 3.6  --  4.4 4.1 3.7  3.4*  CL 107  --  105 107 109 106  CO2 18*  --  21* 21* 18* 21*  GLUCOSE 141*  --  192* 139* 128* 96  BUN 25*  --  26* 28* 18 14  CREATININE 1.94*   < > 2.02* 2.14* 2.03* 1.73*  CALCIUM 9.3  --  8.7* 8.6* 8.5* 8.6*  PROT 7.3  --  6.1*  --   --   --   ALBUMIN 3.7  --  3.1*  --   --   --   AST 42*  --  35  --   --   --   ALT 31  --  27  --   --   --   ALKPHOS 86  --  73  --   --   --   BILITOT 0.8  --  0.6  --   --   --   GFRNONAA 28*  --  26* 25* 26* 32*  GFRAA 32*  --  31* 28* 30* 37*  ANIONGAP 15  --  12 7 9 12    < > = values in this interval not displayed.     Hematology Recent Labs  Lab 01/18/19 2339 01/19/19 0546 01/23/19 0254  WBC  11.6* 12.0* 10.1  RBC 4.46 3.93 3.83*  HGB 13.4 11.8* 11.6*  HCT 39.1 35.3* 33.9*  MCV 87.7 89.8 88.5  MCH 30.0 30.0 30.3  MCHC 34.3 33.4 34.2  RDW 12.5 12.5 12.9  PLT 241 206 210    Cardiac Enzymes Recent Labs  Lab 01/18/19 2339 01/19/19 0546 01/19/19 0948  TROPONINI 0.03* 0.09* 0.08*   No results for input(s): TROPIPOC in the last 168 hours.   BNP Recent Labs  Lab 01/18/19 2339  BNP 71.7     DDimer  Recent Labs  Lab 01/18/19 2339  DDIMER >20.00*     Radiology    No results found.  Cardiac Studies   2D Echo: IMPRESSIONS    1. The left ventricle has normal systolic function, with an ejection fraction of 60-65%. The cavity size was normal. There is mildly increased left ventricular wall thickness. No evidence of left ventricular regional wall motion abnormalities.  2. The right ventricle has normal systolc function. The cavity was normal. There is no increase in right ventricular wall thickness.  3. Left atrial size was mildly dilated.  4. Trivial pericardial effusion is present.  5. No evidence of mitral valve stenosis. Trivial mitral regurgitation.  6. The aortic valve is tricuspid Mild calcification of the aortic valve. No stenosis of the aortic valve.  7. The aortic root is normal in size and structure.   8. The IVC was normal in size. No complete TR doppler jet so unable to estimate PA systolic pressure.  FINDINGS  Left Ventricle: The left ventricle has normal systolic function, with an ejection fraction of 60-65%. The cavity size was normal. There is mildly increased left ventricular wall thickness. No evidence of left ventricular regional wall motion  abnormalities.  Patient Profile     58 y.o. female with hypertensive emergency and type B aortic dissection  Assessment & Plan    1.  Acute type B aortic dissection: Continue current management per Dr. Darrick Penna.  Appears to be stable.  2.  Hypertensive emergency: Blood pressure greatly improved now off of all IV infusions and treated with a combination of carvedilol and amlodipine. I agree with increasing amlodipine to 10 mg po daily as she still has BP in 140-150 mmHg.  3.  Acute on chronic kidney disease stage III, stable  4.  Tobacco abuse: Cessation counseling done  5.  Minimal troponin elevation: No evidence of ACS.  Suspect demand ischemia in the setting of hypertensive emergency.  No regional wall motion abnormalities on echo.  No further evaluation planned.  For questions or updates, please contact [facility] Please consult [email or web address] for contact info under     Signed, Tobias Alexander, MD  01/23/2019, 11:31 AM

## 2019-01-23 NOTE — Progress Notes (Signed)
Patient nauseated. Phenergan 6.25 mg given as ordered as needed for nausea. Heather Pollard, Randall An rN

## 2019-01-23 NOTE — Progress Notes (Addendum)
  Progress Note    01/23/2019 8:49 AM  Subjective:  Generalized soreness in back; patient attributes this to being in bed over the past several days   Vitals:   01/22/19 1939 01/23/19 0406  BP: (!) 162/84 (!) 159/76  Pulse: 77   Resp: (!) 21 18  Temp: 98 F (36.7 C) 98.3 F (36.8 C)  SpO2: 97% 98%   Physical Exam: Lungs:  Non labored Extremities:  Symmetrical DP and radial pulses Abdomen:  Soft Neurologic: A&O  CBC    Component Value Date/Time   WBC 10.1 01/23/2019 0254   RBC 3.83 (L) 01/23/2019 0254   HGB 11.6 (L) 01/23/2019 0254   HCT 33.9 (L) 01/23/2019 0254   PLT 210 01/23/2019 0254   MCV 88.5 01/23/2019 0254   MCH 30.3 01/23/2019 0254   MCHC 34.2 01/23/2019 0254   RDW 12.9 01/23/2019 0254   LYMPHSABS 4.1 (H) 01/18/2019 2339   MONOABS 1.1 (H) 01/18/2019 2339   EOSABS 0.3 01/18/2019 2339   BASOSABS 0.1 01/18/2019 2339    BMET    Component Value Date/Time   NA 139 01/23/2019 0254   K 3.4 (L) 01/23/2019 0254   CL 106 01/23/2019 0254   CO2 21 (L) 01/23/2019 0254   GLUCOSE 96 01/23/2019 0254   BUN 14 01/23/2019 0254   CREATININE 1.73 (H) 01/23/2019 0254   CALCIUM 8.6 (L) 01/23/2019 0254   GFRNONAA 32 (L) 01/23/2019 0254   GFRAA 37 (L) 01/23/2019 0254    INR No results found for: INR   Intake/Output Summary (Last 24 hours) at 01/23/2019 0849 Last data filed at 01/22/2019 1100 Gross per 24 hour  Intake 539.77 ml  Output 400 ml  Net 139.77 ml     Assessment/Plan:  58 y.o. female  With type B aortic dissection  No signs or symptoms of malperfusion BP elevated overnight; goal is to keep SBP <140; defer to Cardiology recommendations CR at baseline; gentle hydration overnight and CTA neck, chest, abd, pelvis tomorrow   Emilie Rutter, PA-C [facility] [phone] 01/23/2019 8:49 AM   I agree with the above.  I have seen and evaluated the patient and agree with the above assessment and plan.  She is having back pain today.  Her  blood pressure has been elevated, greater than 150.  I have recommended lowering the threshold to give her as needed blood pressure medication.  I am also starting her on oral narcotics.  Her creatinine is now 1.7 which is her baseline.  She will be hydrated gently overnight with 50 cc an hour of normal saline with plans for CT angiogram of the neck to evaluate her carotid arteries as well as the chest abdomen and pelvis for evaluation of her dissection.  Durene Cal

## 2019-01-24 ENCOUNTER — Inpatient Hospital Stay (HOSPITAL_COMMUNITY): Payer: 59

## 2019-01-24 LAB — BASIC METABOLIC PANEL
Anion gap: 9 (ref 5–15)
BUN: 14 mg/dL (ref 6–20)
CO2: 23 mmol/L (ref 22–32)
Calcium: 8.6 mg/dL — ABNORMAL LOW (ref 8.9–10.3)
Chloride: 106 mmol/L (ref 98–111)
Creatinine, Ser: 1.59 mg/dL — ABNORMAL HIGH (ref 0.44–1.00)
GFR calc Af Amer: 41 mL/min — ABNORMAL LOW (ref >60)
GFR calc non Af Amer: 35 mL/min — ABNORMAL LOW (ref >60)
Glucose, Bld: 92 mg/dL (ref 70–99)
Potassium: 3.3 mmol/L — ABNORMAL LOW (ref 3.5–5.1)
Sodium: 138 mmol/L (ref 135–145)

## 2019-01-24 MED ORDER — IOHEXOL 350 MG/ML SOLN
80.0000 mL | Freq: Once | INTRAVENOUS | Status: AC | PRN
  Administered 2019-01-24: 15:00:00 80 mL via INTRAVENOUS

## 2019-01-24 MED ORDER — CARVEDILOL 25 MG PO TABS
25.0000 mg | ORAL_TABLET | Freq: Two times a day (BID) | ORAL | Status: DC
  Administered 2019-01-24 – 2019-01-25 (×2): 25 mg via ORAL
  Filled 2019-01-24 (×2): qty 1

## 2019-01-24 NOTE — Progress Notes (Signed)
Dr. Myra Gianotti aware of patients CT results in Epic. No new orders received at this time Will monitor patient. Reeves Dam, Randall An RN

## 2019-01-24 NOTE — Progress Notes (Signed)
    Subjective  -  Feels much better this am BP better controlled today   Physical Exam:  abd soft Palpable pedal pulses       Assessment/Plan:    Type III aortic dissection:  Pain and BP are better this am.  Creatinine is down to 1.59.  I will order CTA of the neck to eval her carotid duplex abnormality as well as a CTa of the C/A/P for dissection eval.  If possible, low dose contrast will be used  Wells Brabham 01/24/2019 10:28 AM --  Vitals:   01/24/19 0404 01/24/19 0822  BP: (!) 153/78 (!) 142/70  Pulse: 91   Resp: 15 (!) 21  Temp: 98.3 F (36.8 C)   SpO2: 97%     Intake/Output Summary (Last 24 hours) at 01/24/2019 1028 Last data filed at 01/24/2019 8280 Gross per 24 hour  Intake 1448.54 ml  Output -  Net 1448.54 ml     Laboratory CBC    Component Value Date/Time   WBC 10.1 01/23/2019 0254   HGB 11.6 (L) 01/23/2019 0254   HCT 33.9 (L) 01/23/2019 0254   PLT 210 01/23/2019 0254    BMET    Component Value Date/Time   NA 138 01/24/2019 0407   K 3.3 (L) 01/24/2019 0407   CL 106 01/24/2019 0407   CO2 23 01/24/2019 0407   GLUCOSE 92 01/24/2019 0407   BUN 14 01/24/2019 0407   CREATININE 1.59 (H) 01/24/2019 0407   CALCIUM 8.6 (L) 01/24/2019 0407   GFRNONAA 35 (L) 01/24/2019 0407   GFRAA 41 (L) 01/24/2019 0407    COAG No results found for: INR, PROTIME No results found for: PTT  Antibiotics Anti-infectives (From admission, onward)   None       V. Charlena Cross, M.D., [facility] [place] Office: [phone] Pager:  [phone]

## 2019-01-24 NOTE — Progress Notes (Signed)
Progress Note  Patient Name: Heather Pollard Date of Encounter: 01/24/2019  Primary Cardiologist: Tonny BollmanMichael Cooper, MD   Subjective   Feels better. Less pain. BP still occasionally elevated   Inpatient Medications    Scheduled Meds: . amLODipine  10 mg Oral Daily  . aspirin EC  81 mg Oral Daily  . carvedilol  25 mg Oral BID WC  . docusate sodium  100 mg Oral BID  . famotidine  20 mg Oral Daily   Continuous Infusions: . sodium chloride 50 mL/hr at 01/24/19 0832  . dextrose 5 % and 0.45% NaCl Stopped (01/22/19 1133)   PRN Meds: acetaminophen **OR** acetaminophen, alum & mag hydroxide-simeth, guaiFENesin-dextromethorphan, hydrALAZINE, HYDROmorphone (DILAUDID) injection, labetalol, nicotine, oxyCODONE-acetaminophen, phenol, promethazine   Vital Signs    Vitals:   01/23/19 2201 01/24/19 0008 01/24/19 0404 01/24/19 0822  BP: 133/80 (!) 153/72 (!) 153/78 (!) 142/70  Pulse: 74 80 91   Resp: 17 15 15  (!) 21  Temp: 98 F (36.7 C)  98.3 F (36.8 C)   TempSrc: Oral  Oral   SpO2: 98% 97% 97%   Weight:      Height:        Intake/Output Summary (Last 24 hours) at 01/24/2019 1031 Last data filed at 01/24/2019 0832 Gross per 24 hour  Intake 1448.54 ml  Output -  Net 1448.54 ml   Last 3 Weights 01/23/2019 01/20/2019 01/18/2019  Weight (lbs) 160 lb 164 lb 14.5 oz 140 lb  Weight (kg) 72.576 kg 74.8 kg 63.504 kg      Telemetry    NSR- Personally Reviewed  ECG    No new tracing  - Personally Reviewed  Physical Exam  Comfortable GEN: No acute distress.   Neck: No JVD Cardiac: RRR, no murmurs, rubs, or gallops.  Respiratory: Clear to auscultation bilaterally. GI: Soft, nontender, non-distended  MS: No edema; No deformity. Neuro:  Nonfocal  Psych: Normal affect   Labs    Chemistry Recent Labs  Lab 01/18/19 2339  01/19/19 0546  01/21/19 1034 01/23/19 0254 01/24/19 0407  NA 140  --  138   < > 136 139 138  K 3.6  --  4.4   < > 3.7 3.4* 3.3*  CL 107  --  105   < >  109 106 106  CO2 18*  --  21*   < > 18* 21* 23  GLUCOSE 141*  --  192*   < > 128* 96 92  BUN 25*  --  26*   < > 18 14 14   CREATININE 1.94*   < > 2.02*   < > 2.03* 1.73* 1.59*  CALCIUM 9.3  --  8.7*   < > 8.5* 8.6* 8.6*  PROT 7.3  --  6.1*  --   --   --   --   ALBUMIN 3.7  --  3.1*  --   --   --   --   AST 42*  --  35  --   --   --   --   ALT 31  --  27  --   --   --   --   ALKPHOS 86  --  73  --   --   --   --   BILITOT 0.8  --  0.6  --   --   --   --   GFRNONAA 28*  --  26*   < > 26* 32* 35*  GFRAA 32*  --  31*   < > 30* 37* 41*  ANIONGAP 15  --  12   < > 9 12 9    < > = values in this interval not displayed.     Hematology Recent Labs  Lab 01/18/19 2339 01/19/19 0546 01/23/19 0254  WBC 11.6* 12.0* 10.1  RBC 4.46 3.93 3.83*  HGB 13.4 11.8* 11.6*  HCT 39.1 35.3* 33.9*  MCV 87.7 89.8 88.5  MCH 30.0 30.0 30.3  MCHC 34.3 33.4 34.2  RDW 12.5 12.5 12.9  PLT 241 206 210    Cardiac Enzymes Recent Labs  Lab 01/18/19 2339 01/19/19 0546 01/19/19 0948  TROPONINI 0.03* 0.09* 0.08*   No results for input(s): TROPIPOC in the last 168 hours.   BNP Recent Labs  Lab 01/18/19 2339  BNP 71.7     DDimer  Recent Labs  Lab 01/18/19 2339  DDIMER >20.00*     Radiology    No results found.  Cardiac Studies   ECHO 1. The left ventricle has normal systolic function, with an ejection fraction of 60-65%. The cavity size was normal. There is mildly increased left ventricular wall thickness. No evidence of left ventricular regional wall motion abnormalities. 2. The right ventricle has normal systolc function. The cavity was normal. There is no increase in right ventricular wall thickness. 3. Left atrial size was mildly dilated. 4. Trivial pericardial effusion is present. 5. No evidence of mitral valve stenosis. Trivial mitral regurgitation. 6. The aortic valve is tricuspid Mild calcification of the aortic valve. No stenosis of the aortic valve. 7. The aortic root is  normal in size and structure. 8. The IVC was normal in size. No complete TR doppler jet so unable to estimate PA systolic pressure.  Patient Profile     58 y.o. female  with acute type B aortic dissection, HTNive urgency, CKD  3, smoker.  Assessment & Plan    1. Ao dissection type B: improved symptoms 2. HTN: target SBP consistently < 130. Increase carvedilol. 3. CKD 3: continues to improve, now at baseline. 4. Smoking:  Recommend cessation permanently.         For questions or updates, please contact [facility] Please consult [email or web address] for contact info under        Signed, Thurmon Fair, MD  01/24/2019, 10:31 AM

## 2019-01-25 LAB — BASIC METABOLIC PANEL
Anion gap: 13 (ref 5–15)
BUN: 10 mg/dL (ref 6–20)
CO2: 22 mmol/L (ref 22–32)
Calcium: 8.4 mg/dL — ABNORMAL LOW (ref 8.9–10.3)
Chloride: 103 mmol/L (ref 98–111)
Creatinine, Ser: 1.51 mg/dL — ABNORMAL HIGH (ref 0.44–1.00)
GFR calc Af Amer: 43 mL/min — ABNORMAL LOW (ref >60)
GFR calc non Af Amer: 37 mL/min — ABNORMAL LOW (ref >60)
Glucose, Bld: 97 mg/dL (ref 70–99)
Potassium: 3.1 mmol/L — ABNORMAL LOW (ref 3.5–5.1)
Sodium: 138 mmol/L (ref 135–145)

## 2019-01-25 MED ORDER — HYDRALAZINE HCL 25 MG PO TABS: 25.0000 mg | ORAL_TABLET | Freq: Three times a day (TID) | ORAL | 0 refills | Status: DC

## 2019-01-25 MED ORDER — OXYCODONE-ACETAMINOPHEN 5-325 MG PO TABS: 1.0000 | ORAL_TABLET | Freq: Four times a day (QID) | ORAL | 0 refills | Status: DC | PRN

## 2019-01-25 MED ORDER — CARVEDILOL 25 MG PO TABS: 25.0000 mg | ORAL_TABLET | Freq: Two times a day (BID) | ORAL | 0 refills | Status: DC

## 2019-01-25 MED ORDER — HYDRALAZINE HCL 25 MG PO TABS
25.0000 mg | ORAL_TABLET | Freq: Three times a day (TID) | ORAL | Status: DC
  Administered 2019-01-25: 08:00:00 25 mg via ORAL
  Filled 2019-01-25: qty 1

## 2019-01-25 MED ORDER — AMLODIPINE BESYLATE 10 MG PO TABS: 10.0000 mg | ORAL_TABLET | Freq: Every day | ORAL | 0 refills | Status: DC

## 2019-01-25 MED ORDER — ASPIRIN 81 MG PO TBEC: 81.0000 mg | DELAYED_RELEASE_TABLET | Freq: Every day | ORAL | Status: AC

## 2019-01-25 NOTE — Progress Notes (Signed)
Progress Note  Patient Name: Heather Pollard Date of Encounter: 01/25/2019  Primary Cardiologist: Tonny Bollman, MD   Subjective   No cardiac complaints   Inpatient Medications    Scheduled Meds:  amLODipine  10 mg Oral Daily   aspirin EC  81 mg Oral Daily   carvedilol  25 mg Oral BID WC   docusate sodium  100 mg Oral BID   famotidine  20 mg Oral Daily   Continuous Infusions:  dextrose 5 % and 0.45% NaCl Stopped (01/22/19 1133)   PRN Meds: acetaminophen **OR** acetaminophen, alum & mag hydroxide-simeth, guaiFENesin-dextromethorphan, hydrALAZINE, HYDROmorphone (DILAUDID) injection, nicotine, oxyCODONE-acetaminophen, phenol, promethazine   Vital Signs    Vitals:   01/24/19 2055 01/24/19 2215 01/25/19 0031 01/25/19 0436  BP: (!) 156/81 132/70 132/65 (!) 145/74  Pulse: 84  77 84  Resp: (!) 22  Temp: 98.5 F (36.9 C)   98.6 F (37 C)  TempSrc: Oral   Oral  SpO2: 95%   96%  Weight:      Height:        Intake/Output Summary (Last 24 hours) at 01/25/2019 0755 Last data filed at 01/24/2019 1820 Gross per 24 hour  Intake 1160.13 ml  Output --  Net 1160.13 ml   Last 3 Weights 01/23/2019 01/20/2019 01/18/2019  Weight (lbs) 160 lb 164 lb 14.5 oz 140 lb  Weight (kg) 72.576 kg 74.8 kg 63.504 kg      Telemetry    NSR- Personally Reviewed  ECG    No new tracing  - Personally Reviewed  Physical Exam  No distress  GEN: No acute distress.   Neck: No JVD Cardiac: RRR, no murmurs, rubs, or gallops.  Respiratory: Clear to auscultation bilaterally. GI: Soft, nontender, non-distended  MS: No edema; No deformity. Neuro:  Nonfocal  Psych: Normal affect   Labs    Chemistry Recent Labs  Lab 01/18/19 2339  01/19/19 0546  01/23/19 0254 01/24/19 0407 01/25/19 0419  NA 140  --  138   < > 139 138 138  K 3.6  --  4.4   < > 3.4* 3.3* 3.1*  CL 107  --  105   < > 106 106 103  CO2 18*  --  21*   < > 21* 23 22  GLUCOSE 141*  --  192*   < > 96 92 97  BUN 25*   --  26*   < > CREATININE 1.94*   < > 2.02*   < > 1.73* 1.59* 1.51*  CALCIUM 9.3  --  8.7*   < > 8.6* 8.6* 8.4*  PROT 7.3  --  6.1*  --   --   --   --   ALBUMIN 3.7  --  3.1*  --   --   --   --   AST 42*  --  35  --   --   --   --   ALT 31  --  27  --   --   --   --   ALKPHOS 86  --  73  --   --   --   --   BILITOT 0.8  --  0.6  --   --   --   --   GFRNONAA 28*  --  26*   < > 32* 35* 37*  GFRAA 32*  --  31*   < > 37* 41* 43*  ANIONGAP 15  --  12   < > < > = values in this interval not displayed.     Hematology Recent Labs  Lab 01/18/19 2339 01/19/19 0546 01/23/19 0254  WBC 11.6* 12.0* 10.1  RBC 4.46 3.93 3.83*  HGB 13.4 11.8* 11.6*  HCT 39.1 35.3* 33.9*  MCV 87.7 89.8 88.5  MCH 30.0 30.0 30.3  MCHC 34.3 33.4 34.2  RDW 12.5 12.5 12.9  PLT 241 206 210    Cardiac Enzymes Recent Labs  Lab 01/18/19 2339 01/19/19 0546 01/19/19 0948  TROPONINI 0.03* 0.09* 0.08*   No results for input(s): TROPIPOC in the last 168 hours.   BNP Recent Labs  Lab 01/18/19 2339  BNP 71.7     DDimer  Recent Labs  Lab 01/18/19 2339  DDIMER >20.00*     Radiology    Ct Angio Neck W Or Wo Contrast  Result Date: 01/24/2019 CLINICAL DATA:  Follow-up aortic dissection. EXAM: CT ANGIOGRAPHY NECK TECHNIQUE: Multidetector CT imaging of the neck was performed using the standard protocol during bolus administration of intravenous contrast. Multiplanar CT image reconstructions and MIPs were obtained to evaluate the vascular anatomy. Carotid stenosis measurements (when applicable) are obtained utilizing NASCET criteria, using the distal internal carotid diameter as the denominator. CONTRAST:  80mL OMNIPAQUE IOHEXOL 350 MG/ML SOLN COMPARISON:  CTA chest, abdomen, and pelvis 01/24/2019 and 01/19/2019. No prior neck imaging. FINDINGS: Aortic arch: There is a standard 3 vessel aortic arch. As seen on the recent prior CTA, there is a type B aortic dissection which is re-evaluated in  detail on separate dedicated CTA of the chest, abdomen, and pelvis. The dissection begins just distal to the left subclavian artery origin, and the arch vessels are uninvolved. The brachiocephalic and subclavian arteries are widely patent. Right carotid system: Patent with scattered mild soft plaque. No evidence of dissection or significant stenosis. Left carotid system: Patent with scattered mild soft plaque. No evidence of dissection or significant stenosis. Vertebral arteries: Patent and codominant without evidence of stenosis or dissection. Skeleton: No acute osseous abnormality or suspicious osseous lesion. Other neck: No mass or enlarged lymph nodes identified. Upper chest: Reported separately. IMPRESSION: 1. Type B aortic dissection reported separately. No involvement of the arch vessels. 2. Widely patent cervical carotid and vertebral arteries. Electronically Signed   By: Sebastian Ache M.D.   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 Multiplanar reconstructed images and MIPs were obtained and reviewed to evaluate the vascular anatomy. CONTRAST:  80mL OMNIPAQUE 350 COMPARISON:  01/19/2019 FINDINGS: CTA CHEST FINDINGS Cardiovascular: There is again noted at type B thoracoabdominal aortic aneurysm identified originating just beyond the origin of the left subclavian artery. No ascending aortic involvement is noted. The false lumen in the descending thoracic aorta demonstrates increasing enhancement consistent with retrograde filling from the more inferior aspect of the false lumen. Some irregularity of the dissection flap is noted in the  distal aspect of the descending aorta/proximal abdominal aorta. These areas of irregularity arise just above the origin of the celiac axis as well as at the level of the superior mesenteric artery origin. These contribute to the increasing opacification of the predominately thrombosed false lumen. Slight  increase in the degree of dilatation of the descending aorta is noted. At the level of the carina there is been increase in transverse diameter from 3.4-3.9 cm. This is consistent with the increased perfusion of false lumen. No cardiac enlargement is noted. No significant coronary artery calcifications are noted. The visualized portions of the brachiocephalic vessels and pulmonary artery appear within normal limits. Mediastinum/Nodes: Thoracic inlet is within normal limits. No hilar or mediastinal adenopathy is identified. A few scattered small calcified hilar nodes are seen consistent with prior granulomatous disease. The esophagus as visualized is within normal limits. Lungs/Pleura: New small left pleural effusion is seen when compared with the prior exam. No active extravasation to suggest hemothorax is noted at this time. Associated left lower lobe atelectatic changes are noted. Right lung is clear. Musculoskeletal: No acute bony abnormality is noted. Review of the MIP images confirms the above findings. CTA ABDOMEN AND PELVIS FINDINGS VASCULAR Aorta: The abdominal aorta demonstrates continuation of the type B aortic dissection extending into the proximal iliac arteries bilaterally. The false lumen again demonstrates opacification similar to that seen on the prior exam. Areas of irregularity are noted as described above in the proximal abdominal aorta consistent with communication between the true and false lumens. The overall appearance of the aorta is similar to that seen on the prior exam without significant increased aneurysmal dilatation. Celiac: Widely patent arising from the true lumen. SMA: Widely patent  arising from the true lumen. Renals: The main right renal artery arises from the true lumen. There are 3 small accessory right renal arteries 1 which arises just above the main right renal artery and 2 which arise inferiorly. All 3 of these accessory arteries arise from the false lumen and contribute to a mottled degree of enhancement within right kidney. This is slightly more marked on the current exam than on the prior study. Left kidney demonstrates dual renal arteries both of which arise from the true lumen. IMA: Widely patent arising from the false lumen Iliacs: The dissection extends into the common iliac arteries bilaterally and subsequently into the right external iliac artery. The dissection on the left terminates just above the iliac bifurcation. Previously seen soft tissue prominence surrounding the iliac arteries is again identified of uncertain significance as no extravasation is noted. Veins: No venous abnormality is seen. Review of the MIP images confirms the above findings. NON-VASCULAR Hepatobiliary: No focal liver abnormality is seen. No gallstones, gallbladder wall thickening, or biliary dilatation. Pancreas: Unremarkable. No pancreatic ductal dilatation or surrounding inflammatory changes. Spleen: Normal in size without focal abnormality. Adrenals/Urinary Tract: Adrenal glands are within normal limits. The left kidney demonstrates fullness of the left renal pelvis and left ureter similar to that seen on the prior exam. This is likely related to extrinsic compression at the crossing point of the iliac artery on the left. These changes are stable from the prior exam. The bladder is well distended. Stomach/Bowel: No acute bowel abnormality is noted. Lymphatic: No significant lymphadenopathy is seen. Reproductive: Uterus and bilateral adnexa are unremarkable. Other: No abdominal wall hernia or abnormality. No abdominopelvic ascites. Musculoskeletal: No acute bony abnormality is seen. Review of the  MIP images confirms the above findings. IMPRESSION: Type B thoracoabdominal aortic dissection as described above. There is increased enhance mint of the false lumen in the thoracic aorta when compared with the prior exam indicating some increase in the degree of retrograde flow within the false lumen. Slight increase in diameter in the descending portion of the thoracic aorta  is noted related to the increased retrograde filling. At the level of the carina of the aorta measures approximately 3.9 cm in transverse dimension increased from 3.4 cm on the prior exam. The increased opacification of the false lumen appears to be related to irregularity/rents within the dissection flap. They appear slightly more prominent than on the prior exam. New left pleural effusion with associated atelectatic changes. This does not appear to be related to a hemothorax given its density. Stable dilatation of the left renal collecting system and left ureter similar to that seen on the prior exam. This is likely related to extrinsic compression at the level of the left common iliac artery. Mottled perfusion of the right kidney secondary to a 3/4 right renal arteries arising from the false lumen with decreased perfusion. This is better visualized on the current exam than seen on the prior study. These results will be called to the ordering clinician or representative by the Radiologist Assistant, and communication documented in the PACS or zVision Dashboard. Electronically Signed   By: Alcide CleverMark  Lukens M.D.   On: 01/24/2019 16:04    Cardiac Studies   ECHO 1. The left ventricle has normal systolic function, with an ejection fraction of 60-65%. The cavity size was normal. There is mildly increased left ventricular wall thickness. No evidence of left ventricular regional wall motion abnormalities. 2. The right ventricle has normal systolc function. The cavity was normal. There is no increase in right ventricular wall thickness. 3. Left  atrial size was mildly dilated. 4. Trivial pericardial effusion is present. 5. No evidence of mitral valve stenosis. Trivial mitral regurgitation. 6. The aortic valve is tricuspid Mild calcification of the aortic valve. No stenosis of the aortic valve. 7. The aortic root is normal in size and structure. 8. The IVC was normal in size. No complete TR doppler jet so unable to estimate PA systolic pressure.  Patient Profile     58 y.o. female  with acute type B aortic dissection, HTNive urgency, CKD  3, smoker.  Assessment & Plan    1. Ao dissection type B: improved symptoms 2. HTN: target SBP consistently < 130. Coreg increased add hydralazine  3. CKD 3: continues to improve, now at baseline.Cr 1.5 this am supplement K 4. Smoking:  Recommend cessation permanently.    Will sign off Outpatient f/u Dr Darrick PennaFields and Excell Seltzerooper      For questions or updates, please contact [facility] Please consult [email or web address] for contact info under        Signed, Charlton HawsPeter Kateland Leisinger, MD  01/25/2019, 7:55 AM

## 2019-01-25 NOTE — Progress Notes (Signed)
Patient given discharge instructions, medication list, and prescriptions sent to personal pharmacy. Follow up appointment information given to patient and verbalized understanding. Will discharge home as ordered.Reviewed information on checking blood pressure at home and patient verbalized understanding patient stated they had a blood pressure cuff/machine at home to check blood pressure. Will discharge home as ordered. Transported to exit with nursing staff and wheel chair.   Kendell Gammon, Randall An rN

## 2019-01-25 NOTE — Progress Notes (Addendum)
  Progress Note    01/25/2019 10:07 AM  Subjective:  Feeling better.  Nose bleed overnight.  Tolerating diet   Vitals:   01/25/19 0805 01/25/19 0931  BP:  (!) 119/57  Pulse: 77   Resp:  16  Temp:    SpO2:     Physical Exam: Lungs:  Non labored Extremities:  Palpable and symmetrical DP pulses Abdomen:  Soft Neurologic: A&O  CBC    Component Value Date/Time   WBC 10.1 01/23/2019 0254   RBC 3.83 (L) 01/23/2019 0254   HGB 11.6 (L) 01/23/2019 0254   HCT 33.9 (L) 01/23/2019 0254   PLT 210 01/23/2019 0254   MCV 88.5 01/23/2019 0254   MCH 30.3 01/23/2019 0254   MCHC 34.2 01/23/2019 0254   RDW 12.9 01/23/2019 0254   LYMPHSABS 4.1 (H) 01/18/2019 2339   MONOABS 1.1 (H) 01/18/2019 2339   EOSABS 0.3 01/18/2019 2339   BASOSABS 0.1 01/18/2019 2339    BMET    Component Value Date/Time   NA 138 01/25/2019 0419   K 3.1 (L) 01/25/2019 0419   CL 103 01/25/2019 0419   CO2 22 01/25/2019 0419   GLUCOSE 97 01/25/2019 0419   BUN 10 01/25/2019 0419   CREATININE 1.51 (H) 01/25/2019 0419   CALCIUM 8.4 (L) 01/25/2019 0419   GFRNONAA 37 (L) 01/25/2019 0419   GFRAA 43 (L) 01/25/2019 0419    INR No results found for: INR   Intake/Output Summary (Last 24 hours) at 01/25/2019 1008 Last data filed at 01/25/2019 0936 Gross per 24 hour  Intake 917.1 ml  Output -  Net 917.1 ml     Assessment/Plan:  58 y.o. female with type B aortic dissection  No signs or symptoms of malperfusion Cardiology BP med adjustments noted Cr at baseline after CTA Dr. Darrick Penna will review repeat CTA this morning   Emilie Rutter, PA-C [facility] [phone] 01/25/2019 10:08 AM  Agree with above.  Pt needs home BP cuff and diary.  Discussed with her. Will need repeat CTA Chest abd pelvis one month D/c home today Follow up Excell Seltzer for BP See me in one month with CTA Chest ABdomen Pelvis  Fabienne Bruns, MD [facility] [place] Office: [phone]  Pager: [phone]

## 2019-01-26 ENCOUNTER — Other Ambulatory Visit: Payer: Self-pay

## 2019-01-26 DIAGNOSIS — I7101 Dissection of thoracic aorta: Secondary | ICD-10-CM

## 2019-01-26 DIAGNOSIS — I71019 Dissection of thoracic aorta, unspecified: Secondary | ICD-10-CM

## 2019-01-26 NOTE — Discharge Summary (Signed)
Physician Discharge Summary   Patient ID: Heather Pollard [id number] 58 y.o. [birth year]  Admit date: 01/18/2019  Discharge date and time: 01/25/2019 12:57 PM   Admitting Physician: Sherren Kerns, MD   Discharge Physician: same  Admission Diagnoses: Aortic dissection, thoracic (HCC) [I71.01] Hypertensive emergency [I16.1]  Discharge Diagnoses: Type B aortic dissection  Admission Condition: poor  Discharged Condition: fair  Indication for Admission: Type B aortic dissection  Hospital Course: Ms. Heather Pollard is a 58 year old female who was admitted to the hospital and found to have type B dissection extending from thoracic aorta down to external iliac arteries.  She was admitted to the ICU for IV blood pressure control as well as pain control.  Cardiology was consulted for strict management of blood pressure.  It should also be noted that patient experienced acute kidney injury however this resolved by avoiding nephrotoxic agents as well as gentle hydration.  She was eventually transferred from the ICU to the stepdown unit and weaned from IV labetalol.  Repeat CTA demonstrated a stable dissection lumen.  There was no evidence of malperfusion of the renal arteries or visceral arteries.  She maintained palpable pedal pulses bilaterally.  Antihypertensive regimen recommended by cardiology at time of discharge included hydralazine, carvedilol, and amlodipine.  Doses and frequencies can be found on patient's medication list.  The plan is for the patient to follow-up in 4 weeks for a repeat CTA chest, abdomen, and pelvis.  She will follow-up with her primary cardiologist Dr. Excell Seltzer for blood pressure control.  Discharge instructions were reviewed with the patient and she voiced her understanding.  She was discharged home in stable condition.  Consults: cardiology   Discharge Exam: See progress note 01/25/2019 Vitals:   01/25/19 0805 01/25/19 0931  BP:  (!) 119/57  Pulse: 77   Resp:  16   Temp:    SpO2:      Disposition: Home  Patient Instructions:  Allergies as of 01/25/2019      Reactions   Penicillins Hives, Diarrhea      Medication List    STOP taking these medications   ibuprofen 600 MG tablet Commonly known as:  ADVIL     TAKE these medications   amLODipine 10 MG tablet Commonly known as:  NORVASC Take 1 tablet (10 mg total) by mouth daily.   aspirin 81 MG EC tablet Take 1 tablet (81 mg total) by mouth daily.   carvedilol 25 MG tablet Commonly known as:  COREG Take 1 tablet (25 mg total) by mouth 2 (two) times daily with a meal.   hydrALAZINE 25 MG tablet Commonly known as:  APRESOLINE Take 1 tablet (25 mg total) by mouth every 8 (eight) hours.   oxyCODONE-acetaminophen 5-325 MG tablet Commonly known as:  PERCOCET/ROXICET Take 1 tablet by mouth every 6 (six) hours as needed for moderate pain. What changed:    how much to take  reasons to take this      Activity: activity as tolerated Diet: regular diet  Follow-up with Dr. Darrick Penna in 4 weeks with CTA chest abdomen and pelvis Follow-up with Dr. Excell Seltzer for blood pressure management  Signed: Emilie Rutter 01/26/2019 9:57 AM

## 2019-01-29 ENCOUNTER — Telehealth: Payer: Self-pay

## 2019-01-29 ENCOUNTER — Telehealth: Payer: Self-pay | Admitting: Cardiovascular Disease

## 2019-01-29 NOTE — Telephone Encounter (Signed)
Pt called requesting pain mediation - said that she is having a lot of discomfort in her chest and back still. Not as bad as it was before when she went to the hospital but definitely still pretty severe.   Called pt and advised her that for what we had done in the hospital, giving additional pain meds should not be warranted. Advised her to go back to the ER for evaluation if she is still having that much pain. Advised that something new could have possibly developed. Tried to explain that some residual discomfort may be common. She said that she will try to take something OTC before going back because she has not tried that and see if it helps. She has tylenol on hand at home.   Pt said that she is taking her BP medication when asked. Pt has been advised if no improvement to not ignore the pain and be checked at ER  Julious Payer, CMA

## 2019-01-29 NOTE — Telephone Encounter (Signed)
The patient was admitted by vascular surgery for Type B aortic dissection. I followed her during the hospitalization for BP management. It is normal/expected to have residual pain from a dissection, but I would recommend she contact Dr Fields/[facility] for recommendations on pain medicine or further evaluation. thx

## 2019-01-29 NOTE — Telephone Encounter (Signed)
I spoke with pt. She reports she is having chest and back pain. Not as severe as when she called EMS.  She has been having this same pain since discharged but it would improve with pain medication.  She took her last pain pill yesterday and has been having pain since yesterday.  Will review with Dr. Excell Seltzer

## 2019-01-29 NOTE — Telephone Encounter (Signed)
Patient was seen in the hospital on 01/18/19 for aortic dissection and was seen by Dr Excell Seltzer. She is still having pain even though she has hydrocodone acetaminophen 5-23 mg but she only had 5 pills and she has taken them all. She needs something for the pain she is still having  and wanted to see if Dr Excell Seltzer can give her something. She would also like to know if it is normal for her to still have the pain.

## 2019-01-29 NOTE — Telephone Encounter (Signed)
I spoke with pt and gave her information from Dr. Excell Seltzer.  She will contact [facility]

## 2019-01-29 NOTE — Telephone Encounter (Signed)
I will send to triage as pt has had recent aortic dissection and having pain.

## 2019-02-14 ENCOUNTER — Other Ambulatory Visit: Payer: Self-pay | Admitting: Physician Assistant

## 2019-02-15 DIAGNOSIS — I712 Thoracic aortic aneurysm, without rupture, unspecified: Secondary | ICD-10-CM

## 2019-02-15 HISTORY — DX: Thoracic aortic aneurysm, without rupture: I71.2

## 2019-02-15 HISTORY — DX: Thoracic aortic aneurysm, without rupture, unspecified: I71.20

## 2019-02-17 ENCOUNTER — Emergency Department (HOSPITAL_COMMUNITY): Payer: 59

## 2019-02-17 ENCOUNTER — Inpatient Hospital Stay (HOSPITAL_COMMUNITY)
Admission: EM | Admit: 2019-02-17 | Discharge: 2019-02-24 | DRG: 220 | Disposition: A | Discharge status: Home / Self Care | Payer: 59 | Attending: Vascular Surgery | Admitting: Vascular Surgery

## 2019-02-17 ENCOUNTER — Encounter (HOSPITAL_COMMUNITY): Payer: Self-pay | Admitting: *Deleted

## 2019-02-17 DIAGNOSIS — I10 Essential (primary) hypertension: Secondary | ICD-10-CM | POA: Diagnosis present

## 2019-02-17 DIAGNOSIS — Z9889 Other specified postprocedural states: Secondary | ICD-10-CM

## 2019-02-17 DIAGNOSIS — Z7982 Long term (current) use of aspirin: Secondary | ICD-10-CM

## 2019-02-17 DIAGNOSIS — Z79899 Other long term (current) drug therapy: Secondary | ICD-10-CM

## 2019-02-17 DIAGNOSIS — N390 Urinary tract infection, site not specified: Secondary | ICD-10-CM | POA: Diagnosis present

## 2019-02-17 DIAGNOSIS — Z880 Allergy status to penicillin: Secondary | ICD-10-CM

## 2019-02-17 DIAGNOSIS — F17200 Nicotine dependence, unspecified, uncomplicated: Secondary | ICD-10-CM | POA: Diagnosis present

## 2019-02-17 DIAGNOSIS — I7103 Dissection of thoracoabdominal aorta: Secondary | ICD-10-CM | POA: Diagnosis present

## 2019-02-17 DIAGNOSIS — I712 Thoracic aortic aneurysm, without rupture, unspecified: Secondary | ICD-10-CM | POA: Diagnosis present

## 2019-02-17 DIAGNOSIS — R079 Chest pain, unspecified: Secondary | ICD-10-CM | POA: Diagnosis present

## 2019-02-17 DIAGNOSIS — I7101 Dissection of thoracic aorta: Secondary | ICD-10-CM | POA: Diagnosis not present

## 2019-02-17 DIAGNOSIS — Z20828 Contact with and (suspected) exposure to other viral communicable diseases: Secondary | ICD-10-CM | POA: Diagnosis present

## 2019-02-17 HISTORY — DX: Unspecified tubal pregnancy without intrauterine pregnancy: O00.109

## 2019-02-17 LAB — CBC
HCT: 35.6 % — ABNORMAL LOW (ref 36.0–46.0)
Hemoglobin: 12.1 g/dL (ref 12.0–15.0)
MCH: 30 pg (ref 26.0–34.0)
MCHC: 34 g/dL (ref 30.0–36.0)
MCV: 88.1 fL (ref 80.0–100.0)
Platelets: 397 10*3/uL (ref 150–400)
RBC: 4.04 MIL/uL (ref 3.87–5.11)
RDW: 12.3 % (ref 11.5–15.5)
WBC: 10.6 10*3/uL — ABNORMAL HIGH (ref 4.0–10.5)
nRBC: 0 % (ref 0.0–0.2)

## 2019-02-17 LAB — COMPREHENSIVE METABOLIC PANEL
ALT: 22 U/L (ref 0–44)
AST: 18 U/L (ref 15–41)
Albumin: 2.8 g/dL — ABNORMAL LOW (ref 3.5–5.0)
Alkaline Phosphatase: 141 U/L — ABNORMAL HIGH (ref 38–126)
Anion gap: 13 (ref 5–15)
BUN: 17 mg/dL (ref 6–20)
CO2: 22 mmol/L (ref 22–32)
Calcium: 9.6 mg/dL (ref 8.9–10.3)
Chloride: 101 mmol/L (ref 98–111)
Creatinine, Ser: 1.18 mg/dL — ABNORMAL HIGH (ref 0.44–1.00)
GFR calc Af Amer: 58 mL/min — ABNORMAL LOW (ref >60)
GFR calc non Af Amer: 50 mL/min — ABNORMAL LOW (ref >60)
Glucose, Bld: 123 mg/dL — ABNORMAL HIGH (ref 70–99)
Potassium: 4.4 mmol/L (ref 3.5–5.1)
Sodium: 136 mmol/L (ref 135–145)
Total Bilirubin: 0.6 mg/dL (ref 0.3–1.2)
Total Protein: 7.9 g/dL (ref 6.5–8.1)

## 2019-02-17 LAB — I-STAT CREATININE, ED: Creatinine, Ser: 1.2 mg/dL — ABNORMAL HIGH (ref 0.44–1.00)

## 2019-02-17 LAB — TROPONIN I: Troponin I: <0.03 ng/mL (ref <0.03)

## 2019-02-17 LAB — SARS CORONAVIRUS 2 BY RT PCR (HOSPITAL ORDER, PERFORMED IN ~~LOC~~ HOSPITAL LAB): SARS Coronavirus 2: NEGATIVE

## 2019-02-17 MED ORDER — CARVEDILOL 25 MG PO TABS
25.0000 mg | ORAL_TABLET | Freq: Two times a day (BID) | ORAL | Status: DC
  Administered 2019-02-18 – 2019-02-24 (×13): 25 mg via ORAL
  Filled 2019-02-17 (×13): qty 1

## 2019-02-17 MED ORDER — PHENOL 1.4 % MT LIQD
1.0000 | OROMUCOSAL | Status: DC | PRN
  Administered 2019-02-24: 1 via OROMUCOSAL
  Filled 2019-02-17: qty 177

## 2019-02-17 MED ORDER — POLYETHYLENE GLYCOL 3350 17 G PO PACK
17.0000 g | PACK | Freq: Every day | ORAL | Status: DC | PRN
  Administered 2019-02-20: 17 g via ORAL
  Filled 2019-02-17: qty 1

## 2019-02-17 MED ORDER — MORPHINE SULFATE (PF) 2 MG/ML IV SOLN
2.0000 mg | INTRAVENOUS | Status: DC | PRN
  Filled 2019-02-17: qty 1

## 2019-02-17 MED ORDER — HYDRALAZINE HCL 25 MG PO TABS
25.0000 mg | ORAL_TABLET | Freq: Three times a day (TID) | ORAL | Status: DC
  Administered 2019-02-17 – 2019-02-24 (×19): 25 mg via ORAL
  Filled 2019-02-17 (×19): qty 1

## 2019-02-17 MED ORDER — POTASSIUM CHLORIDE CRYS ER 20 MEQ PO TBCR: 20.0000 meq | EXTENDED_RELEASE_TABLET | Freq: Once | ORAL | Status: DC

## 2019-02-17 MED ORDER — HYDROMORPHONE HCL 1 MG/ML IJ SOLN
0.5000 mg | Freq: Once | INTRAMUSCULAR | Status: AC
  Administered 2019-02-17: 0.5 mg via INTRAVENOUS
  Filled 2019-02-17: qty 1

## 2019-02-17 MED ORDER — SODIUM CHLORIDE 0.9 % IV SOLN: 250.0000 mL | INTRAVENOUS | Status: DC | PRN

## 2019-02-17 MED ORDER — METOPROLOL TARTRATE 5 MG/5ML IV SOLN: 2.0000 mg | INTRAVENOUS | Status: DC | PRN

## 2019-02-17 MED ORDER — ONDANSETRON HCL 4 MG/2ML IJ SOLN: 4.0000 mg | Freq: Four times a day (QID) | INTRAMUSCULAR | Status: DC | PRN

## 2019-02-17 MED ORDER — ALUM & MAG HYDROXIDE-SIMETH 200-200-20 MG/5ML PO SUSP: 15.0000 mL | ORAL | Status: DC | PRN

## 2019-02-17 MED ORDER — OXYCODONE-ACETAMINOPHEN 5-325 MG PO TABS
1.0000 | ORAL_TABLET | ORAL | Status: DC | PRN
  Administered 2019-02-17 – 2019-02-24 (×26): 2 via ORAL
  Filled 2019-02-17 (×26): qty 2

## 2019-02-17 MED ORDER — ASPIRIN EC 81 MG PO TBEC
81.0000 mg | DELAYED_RELEASE_TABLET | Freq: Every day | ORAL | Status: DC
  Administered 2019-02-18 – 2019-02-24 (×7): 81 mg via ORAL
  Filled 2019-02-17 (×7): qty 1

## 2019-02-17 MED ORDER — AMLODIPINE BESYLATE 10 MG PO TABS
10.0000 mg | ORAL_TABLET | Freq: Every day | ORAL | Status: DC
  Administered 2019-02-18 – 2019-02-24 (×7): 10 mg via ORAL
  Filled 2019-02-17 (×7): qty 1

## 2019-02-17 MED ORDER — HYDROMORPHONE HCL 1 MG/ML IJ SOLN
1.0000 mg | Freq: Once | INTRAMUSCULAR | Status: AC
  Administered 2019-02-17: 1 mg via INTRAVENOUS
  Filled 2019-02-17: qty 1

## 2019-02-17 MED ORDER — ACETAMINOPHEN 325 MG RE SUPP: 325.0000 mg | RECTAL | Status: DC | PRN

## 2019-02-17 MED ORDER — SODIUM CHLORIDE 0.9% FLUSH: 3.0000 mL | Freq: Once | INTRAVENOUS | Status: DC

## 2019-02-17 MED ORDER — ACETAMINOPHEN 325 MG PO TABS: 325.0000 mg | ORAL_TABLET | ORAL | Status: DC | PRN

## 2019-02-17 MED ORDER — SODIUM CHLORIDE 0.9% FLUSH: 3.0000 mL | INTRAVENOUS | Status: DC | PRN

## 2019-02-17 MED ORDER — PANTOPRAZOLE SODIUM 40 MG PO TBEC
40.0000 mg | DELAYED_RELEASE_TABLET | Freq: Every day | ORAL | Status: DC
  Administered 2019-02-18 – 2019-02-24 (×7): 40 mg via ORAL
  Filled 2019-02-17 (×7): qty 1

## 2019-02-17 MED ORDER — HYDRALAZINE HCL 20 MG/ML IJ SOLN: 5.0000 mg | INTRAMUSCULAR | Status: DC | PRN

## 2019-02-17 MED ORDER — BISACODYL 10 MG RE SUPP
10.0000 mg | Freq: Every day | RECTAL | Status: DC | PRN
  Administered 2019-02-24: 10 mg via RECTAL
  Filled 2019-02-17: qty 1

## 2019-02-17 MED ORDER — LABETALOL HCL 5 MG/ML IV SOLN: 10.0000 mg | INTRAVENOUS | Status: DC | PRN

## 2019-02-17 MED ORDER — SODIUM CHLORIDE 0.9% FLUSH
3.0000 mL | Freq: Two times a day (BID) | INTRAVENOUS | Status: DC
  Administered 2019-02-17 – 2019-02-23 (×11): 3 mL via INTRAVENOUS

## 2019-02-17 MED ORDER — ONDANSETRON HCL 4 MG/2ML IJ SOLN
4.0000 mg | Freq: Once | INTRAMUSCULAR | Status: AC
  Administered 2019-02-17: 4 mg via INTRAVENOUS
  Filled 2019-02-17: qty 2

## 2019-02-17 MED ORDER — HEPARIN SODIUM (PORCINE) 5000 UNIT/ML IJ SOLN
5000.0000 [IU] | Freq: Three times a day (TID) | INTRAMUSCULAR | Status: DC
  Administered 2019-02-17 – 2019-02-24 (×18): 5000 [IU] via SUBCUTANEOUS
  Filled 2019-02-17 (×18): qty 1

## 2019-02-17 MED ORDER — IOHEXOL 350 MG/ML SOLN
100.0000 mL | Freq: Once | INTRAVENOUS | Status: AC | PRN
  Administered 2019-02-17: 100 mL via INTRAVENOUS

## 2019-02-17 MED ORDER — GUAIFENESIN-DM 100-10 MG/5ML PO SYRP: 15.0000 mL | ORAL_SOLUTION | ORAL | Status: DC | PRN

## 2019-02-17 NOTE — ED Notes (Signed)
MD at bedside. 

## 2019-02-17 NOTE — ED Triage Notes (Signed)
To ED for eval of cp and back pain since waking. States she was in here a few weeks ago with dx of torn aorta. No surgery- treating medically. Pt states the pain became severe after getting up out of chair this am.

## 2019-02-17 NOTE — ED Notes (Signed)
Report was given to 4E

## 2019-02-17 NOTE — ED Notes (Signed)
ED TO INPATIENT HANDOFF REPORT  ED Nurse Name and Phone #:  Alvino Chapel [phone]  S Name/Age/Gender Heather Pollard 58 y.o. female Room/Bed: 030C/030C  Code Status   Code Status: Full Code  Home/SNF/Other Home Patient oriented to: self, place, time and situation Is this baseline? Yes   Triage Complete: Triage complete  Chief Complaint Torn Aorta  Triage Note To ED for eval of cp and back pain since waking. States she was in here a few weeks ago with dx of torn aorta. No surgery- treating medically. Pt states the pain became severe after getting up out of chair this am.    Allergies Allergies  Allergen Reactions  . Penicillins Hives and Diarrhea    Did it involve swelling of the face/tongue/throat, SOB, or low BP? No Did it involve sudden or severe rash/hives, skin peeling, or any reaction on the inside of your mouth or nose? No Did you need to seek medical attention at a hospital or doctor's office? No When did it last happen? less than 10 years If all above answers are "NO", may proceed with cephalosporin use.     Level of Care/Admitting Diagnosis ED Disposition    ED Disposition Condition Comment   Admit  Hospital Area: [facility] [[id number]]  Level of Care: Progressive [102]  Covid Evaluation: Screening Protocol (No Symptoms)  Diagnosis: Thoracic aortic aneurysm [facility]) [[id number]]  Admitting Physician: EARLY, TODD F [[id number]]  Attending Physician: Sherren Kerns [phone]  Estimated length of stay: past midnight tomorrow  Certification:: I certify this patient will need inpatient services for at least 2 midnights  Bed request comments: 4 east  PT Class (Do Not Modify): Inpatient [101]  PT Acc Code (Do Not Modify): Private [1]       B Medical/Surgery History Past Medical History:  Diagnosis Date  . Ectopic pregnancy    Past Surgical History:  Procedure Laterality Date  . BREAST SURGERY    . PARTIAL HYSTERECTOMY       A IV  Location/Drains/Wounds Patient Lines/Drains/Airways Status   Active Line/Drains/Airways    Name:   Placement date:   Placement time:   Site:   Days:   Peripheral IV 02/17/19 Left Antecubital   02/17/19    0854    Antecubital   less than 1          Intake/Output Last 24 hours No intake or output data in the 24 hours ending 02/17/19 1756  Labs/Imaging Results for orders placed or performed during the hospital encounter of 02/17/19 (from the past 48 hour(s))  CBC     Status: Abnormal   Collection Time: 02/17/19  8:47 AM  Result Value Ref Range   WBC 10.6 (H) 4.0 - 10.5 K/uL   RBC 4.04 3.87 - 5.11 MIL/uL   Hemoglobin 12.1 12.0 - 15.0 g/dL   HCT 01.7 (L) 79.3 - 90.3 %   MCV 88.1 80.0 - 100.0 fL   MCH 30.0 26.0 - 34.0 pg   MCHC 34.0 30.0 - 36.0 g/dL   RDW 00.9 23.3 - 00.7 %   Platelets 397 150 - 400 K/uL   nRBC 0.0 0.0 - 0.2 %    Comment: Performed at [facility], [address] [place], [place] [address]  Troponin I - ONCE - STAT     Status: None   Collection Time: 02/17/19  8:47 AM  Result Value Ref Range   Troponin I <0.03 <0.03 ng/mL    Comment: Performed at [facility], [address] [place], [place] [address]  Comprehensive metabolic panel     Status: Abnormal   Collection Time: 02/17/19  8:50 AM  Result Value Ref Range   Sodium 136 135 - 145 mmol/L   Potassium 4.4 3.5 - 5.1 mmol/L   Chloride 101 98 - 111 mmol/L   CO2 22 22 - 32 mmol/L   Glucose, Bld 123 (H) 70 - 99 mg/dL   BUN 17 6 - 20 mg/dL   Creatinine, Ser 3.76 (H) 0.44 - 1.00 mg/dL   Calcium 9.6 8.9 - 28.3 mg/dL   Total Protein 7.9 6.5 - 8.1 g/dL   Albumin 2.8 (L) 3.5 - 5.0 g/dL   AST 18 15 - 41 U/L   ALT 22 0 - 44 U/L   Alkaline Phosphatase 141 (H) 38 - 126 U/L   Total Bilirubin 0.6 0.3 - 1.2 mg/dL   GFR calc non Af Amer 50 (L) >60 mL/min   GFR calc Af Amer 58 (L) >60 mL/min   Anion gap 13 5 - 15    Comment: Performed at [facility], [address] [place], [place] [address]   I-Stat Creatinine, ED (not at [facility])     Status: Abnormal   Collection Time: 02/17/19  9:08 AM  Result Value Ref Range   Creatinine, Ser 1.20 (H) 0.44 - 1.00 mg/dL   Ct Angio Chest/abd/pel For Dissection W And/or Wo Contrast  Result Date: 02/17/2019 CLINICAL DATA:  Increasing chest pain, known aortic dissection. EXAM: CT ANGIOGRAPHY CHEST, ABDOMEN AND PELVIS TECHNIQUE: Multidetector CT imaging through the chest, abdomen and pelvis was performed using the standard protocol during bolus administration of intravenous contrast. Multiplanar reconstructed images and MIPs were obtained and reviewed to evaluate the vascular anatomy. CONTRAST:  OMNIPAQUE IOHEXOL 350 MG/ML SOLN COMPARISON:  01/24/19 FINDINGS: CTA CHEST FINDINGS Cardiovascular: Ascending aorta is again within normal limits. In the distal aspect of the aortic arch, there is a type B dissection similar to that seen on the prior examination. The degree of enhancement of the false lumen in the descending thoracic aorta has decreased somewhat in the interval from the prior exam in part due to thrombosis. The descending thoracic aorta appears slightly larger when compared with the prior exam now measuring 4.4 cm and previously measured 3.9 cm. The previously seen left-sided pleural effusion has resolved in the interval. There remain intercommunications between the true and false lumen particularly in the distal aspect of the descending thoracic aorta as well as within the abdominal aorta. Pulmonary artery is within normal limits. No pericardial effusion is seen. No coronary calcifications are noted. Very mild atherosclerotic calcifications of the aorta are seen. Mediastinum/Nodes: Thoracic inlet is within normal limits. No hilar or mediastinal adenopathy is noted. The esophagus as visualized is within normal limits. Lungs/Pleura: Lungs are well aerated bilaterally. No focal infiltrate or sizable effusion is seen. Musculoskeletal: Degenerative changes of  the thoracic spine are noted. Review of the MIP images confirms the above findings. CTA ABDOMEN AND PELVIS FINDINGS VASCULAR Aorta: Abdominal aorta demonstrates continued dissection into the common iliac arteries bilaterally and subsequent extension into the right external iliac artery. Celiac: Widely patent arising from the true lumen. SMA: Mildly patent arising from the false lumen. Renals: Dual renal arteries are noted on the left which are both arise from the true lumen. The main right renal artery arises from the true lumen. There are 3 accessory renal arteries all of which arise from the false lumen similar  to that seen on the prior examination. The lower pole of the right kidney anteriorly again demonstrates some mild enhancement although relatively stable from that seen on the prior exam. IMA: Widely patent arising from the false lumen Iliacs: The dissection flap extends into the right external iliac artery. The internal and external iliac arteries are patent on the right. The dissection flap extends into the left common iliac artery and terminates prior to the bifurcation although some slight increase in the degree of narrowing of the proximal external iliac artery on the left is noted. Veins: No venous abnormality is noted. Review of the MIP images confirms the above findings. NON-VASCULAR Hepatobiliary: No focal liver abnormality is seen. No gallstones, gallbladder wall thickening, or biliary dilatation. Pancreas: Unremarkable. No pancreatic ductal dilatation or surrounding inflammatory changes. Spleen: Normal in size without focal abnormality. Adrenals/Urinary Tract: Adrenal glands are within normal limits. The enhancement pattern of the right kidney is somewhat decreased when compared with the left consistent with the known accessory renal arteries arising from the false lumen. The previously seen prominence of the proximal left renal pelvis and ureter is less well appreciated on today's exam. Bladder  is well distended. Stomach/Bowel: The appendix is within normal limits. The large and small bowel appear within normal limits. Lymphatic: No specific lymphadenopathy is noted. Reproductive: Uterus and bilateral adnexa are unremarkable. Other: No abdominal wall hernia or abnormality. No abdominopelvic ascites. Musculoskeletal: No acute or significant osseous findings. Review of the MIP images confirms the above findings. IMPRESSION: Persistent type B distal aortic dissection. The degree of enhancement in the descending thoracic aorta is less prominent than that seen on the prior exam although the overall size of the descending aneurysmal dilatation is increased to 4.4 cm from 3.9 cm on the prior exam. Persistent inter communications between the true and false lumens are noted. The differential in enhancement may be related to the timing of the contrast bolus. Resolution of previously seen left-sided pleural effusion. Mottled enhancement of the right kidney related to some accessory arteries on the right it arising from the true lumen. Critical Value/emergent results were called by telephone at the time of interpretation on 02/17/2019 at 10:23 am to [facility] , who verbally acknowledged these results. Electronically Signed   By: Alcide CleverMark  Lukens M.D.   On: 02/17/2019 10:27    Pending Labs Unresulted Labs (From admission, onward)    Start     Ordered   02/18/19 0500  CBC  Tomorrow morning,   R     02/17/19 1637   02/18/19 0500  Basic metabolic panel  Tomorrow morning,   R     02/17/19 1637   02/17/19 1638  Urinalysis, Routine w reflex microscopic  Once,   R     02/17/19 1637   02/17/19 1630  SARS Coronavirus 2 (CEPHEID - [facility]  Once,   R    Question:  Rule Out  Answer:  Yes   02/17/19 1629          Vitals/Pain Today's Vitals   02/17/19 1630 02/17/19 1720 02/17/19 1753 02/17/19 1755  BP: (!) 142/62  (!) 113/53   Pulse: 75  71   Resp: 12  13    Temp:   (!) 97.3 F (36.3 C)   TempSrc:   Oral   SpO2: 99%  96%   PainSc:  4  4  4      Isolation Precautions No active isolations  Medications Medications  sodium chloride flush (NS) 0.9 % injection 3 mL (3 mLs Intravenous Not Given 02/17/19 0949)  amLODipine (NORVASC) tablet 10 mg (10 mg Oral Not Given 02/17/19 1754)  aspirin EC tablet 81 mg (has no administration in time range)  carvedilol (COREG) tablet 25 mg (has no administration in time range)  hydrALAZINE (APRESOLINE) tablet 25 mg (25 mg Oral Not Given 02/17/19 1755)  potassium chloride SA (K-DUR) CR tablet 20-40 mEq (has no administration in time range)  ondansetron (ZOFRAN) injection 4 mg (has no administration in time range)  alum & mag hydroxide-simeth (MAALOX/MYLANTA) 200-200-20 MG/5ML suspension 15-30 mL (has no administration in time range)  pantoprazole (PROTONIX) EC tablet 40 mg (has no administration in time range)  labetalol (NORMODYNE) injection 10 mg (has no administration in time range)  hydrALAZINE (APRESOLINE) injection 5 mg (has no administration in time range)  metoprolol tartrate (LOPRESSOR) injection 2-5 mg (has no administration in time range)  guaiFENesin-dextromethorphan (ROBITUSSIN DM) 100-10 MG/5ML syrup 15 mL (has no administration in time range)  phenol (CHLORASEPTIC) mouth spray 1 spray (has no administration in time range)  heparin injection 5,000 Units (has no administration in time range)  sodium chloride flush (NS) 0.9 % injection 3 mL (has no administration in time range)  sodium chloride flush (NS) 0.9 % injection 3 mL (has no administration in time range)  0.9 %  sodium chloride infusion (has no administration in time range)  acetaminophen (TYLENOL) tablet 325-650 mg (has no administration in time range)    Or  acetaminophen (TYLENOL) suppository 325-650 mg (has no administration in time range)  oxyCODONE-acetaminophen (PERCOCET/ROXICET) 5-325 MG per tablet 1-2 tablet (2 tablets Oral Given 02/17/19  1634)  morphine 2 MG/ML injection 2 mg (has no administration in time range)  polyethylene glycol (MIRALAX / GLYCOLAX) packet 17 g (has no administration in time range)  bisacodyl (DULCOLAX) suppository 10 mg (has no administration in time range)  HYDROmorphone (DILAUDID) injection 0.5 mg (0.5 mg Intravenous Given 02/17/19 0915)  ondansetron (ZOFRAN) injection 4 mg (4 mg Intravenous Given 02/17/19 0913)  iohexol (OMNIPAQUE) 350 MG/ML injection 100 mL (100 mLs Intravenous Contrast Given 02/17/19 0938)  HYDROmorphone (DILAUDID) injection 1 mg (1 mg Intravenous Given 02/17/19 0957)    Mobility walks Low fall risk   Focused Assessments Cardiac Assessment Handoff:    Lab Results  Component Value Date   TROPONINI <0.03 02/17/2019   Lab Results  Component Value Date   DDIMER >20.00 (H) 01/18/2019   Does the Patient currently have chest pain? 4/10 but pt states that this is the best she has felt since mothersday    R Recommendations: See Admitting Provider Note  Report given to:   Additional Notes:  Pt states that she is feeling well.  4/10 back pain.  Surgery is planned for Friday

## 2019-02-17 NOTE — ED Notes (Signed)
Call to pt spouse to update him.  Pt also speaking to spouse. Pt is alert and oriented at this time.  Still appears in some pain, will notify EDP

## 2019-02-17 NOTE — ED Notes (Signed)
Await call back from 4E

## 2019-02-17 NOTE — H&P (Signed)
[facility] [place]  Patient name: Heather Pollard Petit MRN: [id number] DOB: 11/15/1960 Sex: female  REASON FOR VISIT: Increased pain with known type B aortic dissection  HPI: Heather Pollard Armbrister is a 58 y.o. female who was admitted on 01/19/2019 with acute type B aortic dissection.  She had sudden onset of tearing sensation in her back going through to her anterior abdominal chest wall.  She is a smoker.  She does not use cocaine.  No family history of aortic dissection.  Did have hypertension in the emergency room and was admitted.  Work-up included a CT scan at that time.  Her blood pressure was well maintained and her pain was under control and she was discharged to home on 01/25/2019.  Plan was for follow-up with Dr. Darrick Pennafields in our office.  She presents to the emergency room today with recurrent tearing sensation in her chest and substernal area.  Nuys any abdominal pain or lower extremity pain.  Does report that she has been extremely tired since her discharge.  She reports that she has had very well maintained blood pressure and is checked it multiple times throughout the day and is never been above 129 systolic.  She does have an unusual sensation of burning and stinging of her skin on her back and reports that she has had persistent pain in the 4-5 out of 10 range.  Past Medical History:  Diagnosis Date  . Ectopic pregnancy     No family history on file.  SOCIAL HISTORY: Social History   Tobacco Use  . Smoking status: Current Every Day Smoker  . Smokeless tobacco: Never Used  Substance Use Topics  . Alcohol use: No    Allergies  Allergen Reactions  . Penicillins Hives and Diarrhea    Current Facility-Administered Medications  Medication Dose Route Frequency Provider Last Rate Last Dose  . sodium chloride flush (NS) 0.9 % injection 3 mL  3 mL Intravenous Once Lennette BihariButler, John Michael, MD       Current Outpatient Medications   Medication Sig Dispense Refill  . amLODipine (NORVASC) 10 MG tablet Take 1 tablet (10 mg total) by mouth daily. 30 tablet 0  . aspirin EC 81 MG EC tablet Take 1 tablet (81 mg total) by mouth daily.    . carvedilol (COREG) 25 MG tablet Take 1 tablet (25 mg total) by mouth 2 (two) times daily with a meal. 60 tablet 0  . hydrALAZINE (APRESOLINE) 25 MG tablet Take 1 tablet (25 mg total) by mouth every 8 (eight) hours. 90 tablet 0  . oxyCODONE-acetaminophen (PERCOCET/ROXICET) 5-325 MG tablet Take 1 tablet by mouth every 6 (six) hours as needed for moderate pain. 12 tablet 0    REVIEW OF SYSTEMS:  [X]  denotes positive finding, [ ]  denotes negative finding Cardiac  Comments:  Chest pain or chest pressure: x   Shortness of breath upon exertion:    Short of breath when lying flat:    Irregular heart rhythm:        Vascular    Pain in calf, thigh, or hip brought on by ambulation:    Pain in feet at night that wakes you up from your sleep:     Blood clot in your veins:    Leg swelling:           PHYSICAL EXAM: Vitals:   02/17/19 0938 02/17/19 0939 02/17/19 1000 02/17/19 1045  BP: 129/83  (!) 141/77 121/70  Pulse:  75 77 73  Resp:  19 20 10   Temp:      TempSrc:      SpO2:  100% 98% 96%    GENERAL: The patient is a well-nourished female, in no acute distress. The vital signs are documented above. CARDIOVASCULAR: 2+ radial and 2+ dorsalis pedis pulses bilaterally.  Abdomen soft and nontender. PULMONARY: There is good air exchange  MUSCULOSKELETAL: There are no major deformities or cyanosis. NEUROLOGIC: No focal weakness or paresthesias are detected. SKIN: There are no ulcers or rashes noted. PSYCHIATRIC: The patient has a normal affect.  DATA:  Repeat CT scan shows some increased diameter from her 01/19/2019 study.  Maximal diameter at that time was 3.9 cm.  Maximal diameter today is 4.4 cm.  She continues to have no malperfusion with flow to her mesentery, renal arteries and lower  extremities off her true lumen.  MEDICAL ISSUES: The patient will be admitted for pain control.  Explained that with a 5 mm increase in size over 1 month would recommend repair during this admission.  Explained the procedure for stent graft repair and also discussed the possible need for carotid subclavian bypass which is being discussed.  Also I discussed the potential for paralysis with stent graft repair.  I also called her husband to discuss the plan since he is unable to visit due to COVID restrictions.  Discussed this also with Dr. Darrick Penna who is her attending vascular surgeon    Larina Earthly, MD [facility] Tel [phone] Pager [phone]

## 2019-02-17 NOTE — ED Provider Notes (Signed)
[facility] EMERGENCY DEPARTMENT Provider Note   CSN: [id number] Arrival date & time: 02/17/19  4098    History   Chief Complaint Chief Complaint  Patient presents with  . Chest Pain  . Back Pain    HPI Heather Pollard is a 58 y.o. female who presents with cc of chest and back pain. She has a known aortic disscetion that was discovered on 01/19/2019 when she presented with the same complaint. She has a pmh of smoking. She denies a hx of htn or known connective tissue disorder. She is being managed medically.  She states that since her discharge she has been followed by Dr. Darrick Penna.  She states that her pain has been moderate for the past few weeks.  She does complain of daily fatigue shortness of breath with exertion.  She states that the pain feels like she is being ripped inside and is burning.  The pain radiates to her left shoulder and the left anterior wall of her chest.  She complains of associated skin sensitivity and has difficulty lying on her back.  Patient states that she was doing well this morning but when she stood up out of her chair suddenly her pain became severe, just like the pain that brought her to the emergency department for the first visit, 10 out of 10.  She denies unilateral weakness, changes in vision.  She does have bilateral hand paresthesias however patient is notably hyperventilating.  She denies fevers, chills, cough, unilateral leg swelling.     HPI  Past Medical History:  Diagnosis Date  . Ectopic pregnancy     Patient Active Problem List   Diagnosis Date Noted  . Aortic dissection distal to left subclavian (HCC) 01/19/2019  . Hypertensive emergency     Past Surgical History:  Procedure Laterality Date  . BREAST SURGERY    . PARTIAL HYSTERECTOMY       OB History   No obstetric history on file.      Home Medications    Prior to Admission medications   Medication Sig Start Date End Date Taking? Authorizing Provider   amLODipine (NORVASC) 10 MG tablet Take 1 tablet (10 mg total) by mouth daily. 01/26/19   Emilie Rutter, PA-C  aspirin EC 81 MG EC tablet Take 1 tablet (81 mg total) by mouth daily. 01/26/19   Emilie Rutter, PA-C  carvedilol (COREG) 25 MG tablet Take 1 tablet (25 mg total) by mouth 2 (two) times daily with a meal. 01/25/19   Emilie Rutter, PA-C  hydrALAZINE (APRESOLINE) 25 MG tablet Take 1 tablet (25 mg total) by mouth every 8 (eight) hours. 01/25/19   Emilie Rutter, PA-C  oxyCODONE-acetaminophen (PERCOCET/ROXICET) 5-325 MG tablet Take 1 tablet by mouth every 6 (six) hours as needed for moderate pain. 01/25/19   Emilie Rutter, PA-C    Family History No family history on file.  Social History Social History   Tobacco Use  . Smoking status: Current Every Day Smoker  . Smokeless tobacco: Never Used  Substance Use Topics  . Alcohol use: No  . Drug use: Not on file     Allergies   Penicillins   Review of Systems Review of Systems Ten systems reviewed and are negative for acute change, except as noted in the HPI.    Physical Exam Updated Vital Signs BP (!) 142/64   Pulse 73   Temp 98 F (36.7 C) (Oral)   Resp 18   SpO2 100%   Physical Exam Vitals  signs and nursing note reviewed.  Constitutional:      General: She is not in acute distress.    Appearance: She is well-developed. She is not diaphoretic.  HENT:     Head: Normocephalic and atraumatic.  Eyes:     General: No scleral icterus.    Conjunctiva/sclera: Conjunctivae normal.  Neck:     Musculoskeletal: Normal range of motion.  Cardiovascular:     Rate and Rhythm: Normal rate and regular rhythm.     Pulses: Normal pulses.          Carotid pulses are 2+ on the right side and 2+ on the left side.      Radial pulses are 2+ on the right side and 2+ on the left side.       Femoral pulses are 2+ on the right side and 2+ on the left side.      Popliteal pulses are 2+ on the right side and 2+ on the left side.        Dorsalis pedis pulses are 2+ on the right side and 2+ on the left side.       Posterior tibial pulses are 2+ on the right side and 2+ on the left side.     Heart sounds: Normal heart sounds. No murmur. No friction rub. No gallop.   Pulmonary:     Effort: Pulmonary effort is normal. No respiratory distress.     Breath sounds: Normal breath sounds.  Abdominal:     General: Bowel sounds are normal. There is no distension.     Palpations: Abdomen is soft. There is no mass.     Tenderness: There is no abdominal tenderness. There is no guarding.  Musculoskeletal:     Right lower leg: No edema.     Left lower leg: No edema.  Skin:    General: Skin is warm and dry.  Neurological:     Mental Status: She is alert and oriented to person, place, and time.  Psychiatric:        Behavior: Behavior normal.      ED Treatments / Results  Labs (all labs ordered are listed, but only abnormal results are displayed) Labs Reviewed  CBC  TROPONIN I  COMPREHENSIVE METABOLIC PANEL    EKG EKG Interpretation  Date/Time:  Wednesday February 17 2019 08:38:50 EDT Ventricular Rate:  84 PR Interval:  208 QRS Duration: 78 QT Interval:  374 QTC Calculation: 441 R Axis:   18 Text Interpretation:  Normal sinus rhythm Anteroseptal infarct , age undetermined Abnormal ECG No significant change since last tracing Confirmed by Melene PlanFloyd, Dan [phone]([id number]) on 02/17/2019 8:48:15 AM   Radiology No results found.  Procedures .Critical Care Performed by: Arthor CaptainHarris, Davena Julian, PA-C Authorized by: Arthor CaptainHarris, Amun Stemm, PA-C   Critical care provider statement:    Critical care time (minutes):  45   Critical care time was exclusive of:  Separately billable procedures and treating other patients   Critical care was necessary to treat or prevent imminent or life-threatening deterioration of the following conditions:  Circulatory failure   Critical care was time spent personally by me on the following activities:  Discussions with  consultants, evaluation of patient's response to treatment, examination of patient, ordering and performing treatments and interventions, ordering and review of laboratory studies, ordering and review of radiographic studies, pulse oximetry, re-evaluation of patient's condition, obtaining history from patient or surrogate and review of old charts   (including critical care time)  Medications Ordered in ED Medications  sodium chloride flush (NS) 0.9 % injection 3 mL (has no administration in time range)     Initial Impression / Assessment and Plan / ED Course  I have reviewed the triage vital signs and the nursing notes.  Pertinent labs & imaging results that were available during my care of the patient were reviewed by me and considered in my medical decision making (see chart for details).  Clinical Course as of Feb 17 1143  Wed Feb 17, 2019  1037 I spoke with Dr. Karle Starch of Radiology and Dr. Arbie Cookey who will come to the ER for consult on the patient.   [AH]    Clinical Course User Index [AH] Arthor Captain, PA-C       BO:FBPZW pain VS:  Vitals:   02/17/19 0938 02/17/19 0939 02/17/19 1000 02/17/19 1045  BP: 129/83  (!) 141/77 121/70  Pulse:  75 77 73  Resp:  19 20 10   Temp:      TempSrc:      SpO2:  100% 98% 96%   CH:ENIDPOE is gathered by patient and emr. DDX:The emergent differential diagnosis of chest pain includes: Acute coronary syndrome, pericarditis, aortic dissection, pulmonary embolism, tension pneumothorax, pneumonia, and esophageal rupture.  Labs: I reviewed the labs which show slightly elevated serum creatinine without significant change from previous slightly elevated alkaline phosphatase.  Mild leukocytosis negative troponin Imaging: I personally reviewed the images (CT angiogram of the chest abdomen and pelvis) which show(s) increasing thoracic dissection from 3.9 to 4.4 cm EKG:   EKG Interpretation  Date/Time:  Wednesday February 17 2019 08:38:50 EDT  Ventricular Rate:  84 PR Interval:  208 QRS Duration: 78 QT Interval:  374 QTC Calculation: 441 R Axis:   18 Text Interpretation:  Normal sinus rhythm Anteroseptal infarct , age undetermined Abnormal ECG No significant change since last tracing Confirmed by Melene Plan [phone]) on 02/17/2019 8:48:15 AM       MDM: 58 year old female with known dissection, descending thoracic dissection is increasing in size.  Dr. Arbie Cookey has consulted on the patient.  He states that she has failed medical management and will be admitted for repair.  Patient's pain is significantly improved here in the emergency department.  Her blood pressures have not been markedly elevated Patient disposition: Admit Patient condition: Stable. The patient appears reasonably stabilized for admission considering the current resources, flow, and capabilities available in the ED at this time, and I doubt any other [facility] requiring further screening and/or treatment in the ED prior to admission.   Final Clinical Impressions(s) / ED Diagnoses   Final diagnoses:  None    ED Discharge Orders    None       Arthor Captain, PA-C 02/17/19 1155    Melene Plan, DO 02/17/19 1159

## 2019-02-17 NOTE — ED Notes (Signed)
This RN acting as Statistician and asked pt if she would like for me to contact any family/friends. Pt states MD just left her room and is calling her husband to update him.

## 2019-02-17 NOTE — ED Notes (Signed)
Pt returned from CT °

## 2019-02-18 ENCOUNTER — Other Ambulatory Visit: Payer: Self-pay

## 2019-02-18 ENCOUNTER — Encounter (HOSPITAL_COMMUNITY): Payer: Self-pay | Admitting: General Practice

## 2019-02-18 LAB — CBC
HCT: 32.3 % — ABNORMAL LOW (ref 36.0–46.0)
Hemoglobin: 10.7 g/dL — ABNORMAL LOW (ref 12.0–15.0)
MCH: 29.6 pg (ref 26.0–34.0)
MCHC: 33.1 g/dL (ref 30.0–36.0)
MCV: 89.5 fL (ref 80.0–100.0)
Platelets: 336 10*3/uL (ref 150–400)
RBC: 3.61 MIL/uL — ABNORMAL LOW (ref 3.87–5.11)
RDW: 12.5 % (ref 11.5–15.5)
WBC: 8.1 10*3/uL (ref 4.0–10.5)
nRBC: 0 % (ref 0.0–0.2)

## 2019-02-18 LAB — BASIC METABOLIC PANEL
Anion gap: 10 (ref 5–15)
BUN: 15 mg/dL (ref 6–20)
CO2: 25 mmol/L (ref 22–32)
Calcium: 8.8 mg/dL — ABNORMAL LOW (ref 8.9–10.3)
Chloride: 102 mmol/L (ref 98–111)
Creatinine, Ser: 1.05 mg/dL — ABNORMAL HIGH (ref 0.44–1.00)
GFR calc Af Amer: >60 mL/min (ref >60)
GFR calc non Af Amer: 58 mL/min — ABNORMAL LOW (ref >60)
Glucose, Bld: 103 mg/dL — ABNORMAL HIGH (ref 70–99)
Potassium: 4.3 mmol/L (ref 3.5–5.1)
Sodium: 137 mmol/L (ref 135–145)

## 2019-02-18 LAB — URINALYSIS, ROUTINE W REFLEX MICROSCOPIC
Bilirubin Urine: NEGATIVE
Glucose, UA: NEGATIVE mg/dL
Ketones, ur: NEGATIVE mg/dL
Nitrite: POSITIVE — AB
Protein, ur: 30 mg/dL — AB
Specific Gravity, Urine: 1.005 (ref 1.005–1.030)
pH: 5 (ref 5.0–8.0)

## 2019-02-18 MED ORDER — DIPHENHYDRAMINE HCL 25 MG PO CAPS: 25.0000 mg | ORAL_CAPSULE | Freq: Four times a day (QID) | ORAL | Status: DC | PRN

## 2019-02-18 NOTE — Progress Notes (Signed)
@  2305 Pt's husband, Yoanna Timpone, called and updated on pt's condition. All questions answered and husband expressed appreciation for the update.

## 2019-02-18 NOTE — Progress Notes (Addendum)
  Progress Note    02/18/2019 7:31 AM  Subjective:  Pain better this morning with pain medication.   Vitals:   02/18/19 0447 02/18/19 0600  BP: (!) 116/56 111/67  Pulse: 75 61  Resp:  12  Temp: 98.3 F (36.8 C)   SpO2: 97% 97%   Physical Exam: Lungs:  Non labored Extremities:  Palpable and symmetrical DP pulses Abdomen:  Soft, NT, ND Neurologic: A&O  CBC    Component Value Date/Time   WBC 8.1 02/18/2019 0309   RBC 3.61 (L) 02/18/2019 0309   HGB 10.7 (L) 02/18/2019 0309   HCT 32.3 (L) 02/18/2019 0309   PLT 336 02/18/2019 0309   MCV 89.5 02/18/2019 0309   MCH 29.6 02/18/2019 0309   MCHC 33.1 02/18/2019 0309   RDW 12.5 02/18/2019 0309   LYMPHSABS 4.1 (H) 01/18/2019 2339   MONOABS 1.1 (H) 01/18/2019 2339   EOSABS 0.3 01/18/2019 2339   BASOSABS 0.1 01/18/2019 2339    BMET    Component Value Date/Time   NA 137 02/18/2019 0309   K 4.3 02/18/2019 0309   CL 102 02/18/2019 0309   CO2 25 02/18/2019 0309   GLUCOSE 103 (H) 02/18/2019 0309   BUN 15 02/18/2019 0309   CREATININE 1.05 (H) 02/18/2019 0309   CALCIUM 8.8 (L) 02/18/2019 0309   GFRNONAA 58 (L) 02/18/2019 0309   GFRAA >60 02/18/2019 0309    INR No results found for: INR  No intake or output data in the 24 hours ending 02/18/19 0731   Assessment/Plan:  58 y.o. female with worsening type B aortic dissection and persistent back pain  CTA demonstrating expanding dissection No signs or symptoms of malperfusion on exam Plan for likely repair this week, schedule permitting Pain medication as needed    Emilie Rutter, PA-C [facility] [phone] 02/18/2019 7:31 AM  Agree with above Pt for TEVAR for dissection on Monday 6/8 possible left subclavian bypass Risks benefits possible complications including but not limited to bleeding infection paraplegia were discussed with the patient today.  She understands and agrees to proceed.  Fabienne Bruns, MD [facility]  [place] Office: [phone] Pager: [phone]

## 2019-02-19 LAB — PREPARE RBC (CROSSMATCH)

## 2019-02-19 MED ORDER — CIPROFLOXACIN HCL 500 MG PO TABS
500.0000 mg | ORAL_TABLET | Freq: Two times a day (BID) | ORAL | Status: DC
  Administered 2019-02-19 – 2019-02-24 (×11): 500 mg via ORAL
  Filled 2019-02-19 (×12): qty 1

## 2019-02-19 MED ORDER — SODIUM CHLORIDE 0.9% IV SOLUTION: Freq: Once | INTRAVENOUS | Status: DC

## 2019-02-19 NOTE — Progress Notes (Signed)
Pharmacy note: cipro   58 yo female with type B aortic dissection and cipro ordered -SCr= 1.05, CrCl ~ 50  Plan -Will dose cipro for renal dysfunction at 500mg  po bid  Harland German, PharmD Clinical Pharmacist **Pharmacist phone directory can now be found on [email or web address] ([email or web address]).  Listed under [facility].

## 2019-02-19 NOTE — H&P (View-Only) (Signed)
[facility] [place]  Subjective  - feels about the same   Objective (!) 105/50 85 98.3 F (36.8 C) (Oral) 12 100%  Intake/Output Summary (Last 24 hours) at 02/19/2019 0811 Last data filed at 02/18/2019 1500 Gross per 24 hour  Intake 720 ml  Output -  Net 720 ml    Assessment/Planning: UA nitrite + WBC since we are putting in prosthetic on Monday will Rx with cipro 500 x 7 days Repair dissection Monday  Preop orders written for Sunday/Monday  Fabienne Bruns 02/19/2019 8:11 AM --  Laboratory Lab Results: Recent Labs    02/17/19 0847 02/18/19 0309  WBC 10.6* 8.1  HGB 12.1 10.7*  HCT 35.6* 32.3*  PLT 397 336   BMET Recent Labs    02/17/19 0850 02/17/19 0908 02/18/19 0309  NA 136  --  137  K 4.4  --  4.3  CL 101  --  102  CO2 22  --  25  GLUCOSE 123*  --  103*  BUN 17  --  15  CREATININE 1.18* 1.20* 1.05*  CALCIUM 9.6  --  8.8*    COAG No results found for: INR, PROTIME No results found for: PTT

## 2019-02-19 NOTE — Progress Notes (Addendum)
[facility] [place]  Subjective  - feels about the same   Objective (!) 105/50 85 98.3 F (36.8 C) (Oral) 12 100%  Intake/Output Summary (Last 24 hours) at 02/19/2019 0811 Last data filed at 02/18/2019 1500 Gross per 24 hour  Intake 720 ml  Output -  Net 720 ml    Assessment/Planning: UA nitrite + WBC since we are putting in prosthetic on Monday will Rx with cipro 500 x 7 days Repair dissection Monday  Preop orders written for Sunday/Monday  Charles Fields 02/19/2019 8:11 AM --  Laboratory Lab Results: Recent Labs    02/17/19 0847 02/18/19 0309  WBC 10.6* 8.1  HGB 12.1 10.7*  HCT 35.6* 32.3*  PLT 397 336   BMET Recent Labs    02/17/19 0850 02/17/19 0908 02/18/19 0309  NA 136  --  137  K 4.4  --  4.3  CL 101  --  102  CO2 22  --  25  GLUCOSE 123*  --  103*  BUN 17  --  15  CREATININE 1.18* 1.20* 1.05*  CALCIUM 9.6  --  8.8*    COAG No results found for: INR, PROTIME No results found for: PTT     

## 2019-02-20 LAB — ABO/RH: ABO/RH(D): O POS

## 2019-02-20 MED ORDER — METHOCARBAMOL 1000 MG/10ML IJ SOLN
500.0000 mg | Freq: Three times a day (TID) | INTRAVENOUS | Status: DC | PRN
  Filled 2019-02-20 (×2): qty 5

## 2019-02-20 NOTE — Progress Notes (Addendum)
  Progress Note    02/20/2019 8:08 AM * No surgery date entered *  Subjective:  Back pain controlled   Vitals:   02/19/19 2332 02/20/19 0408  BP: 128/75 130/69  Pulse: 71 72  Resp: 18 10  Temp: 98.1 F (36.7 C) 98.1 F (36.7 C)  SpO2: 96% 98%   Physical Exam Lungs:  Non labored Extremities:  Symmetrical DP pulses Abdomen:  Soft, NT, ND Neurologic: A&O  CBC    Component Value Date/Time   WBC 8.1 02/18/2019 0309   RBC 3.61 (L) 02/18/2019 0309   HGB 10.7 (L) 02/18/2019 0309   HCT 32.3 (L) 02/18/2019 0309   PLT 336 02/18/2019 0309   MCV 89.5 02/18/2019 0309   MCH 29.6 02/18/2019 0309   MCHC 33.1 02/18/2019 0309   RDW 12.5 02/18/2019 0309   LYMPHSABS 4.1 (H) 01/18/2019 2339   MONOABS 1.1 (H) 01/18/2019 2339   EOSABS 0.3 01/18/2019 2339   BASOSABS 0.1 01/18/2019 2339    BMET    Component Value Date/Time   NA 137 02/18/2019 0309   K 4.3 02/18/2019 0309   CL 102 02/18/2019 0309   CO2 25 02/18/2019 0309   GLUCOSE 103 (H) 02/18/2019 0309   BUN 15 02/18/2019 0309   CREATININE 1.05 (H) 02/18/2019 0309   CALCIUM 8.8 (L) 02/18/2019 0309   GFRNONAA 58 (L) 02/18/2019 0309   GFRAA >60 02/18/2019 0309    INR No results found for: INR   Intake/Output Summary (Last 24 hours) at 02/20/2019 9629 Last data filed at 02/20/2019 0428 Gross per 24 hour  Intake 840 ml  Output 400 ml  Net 440 ml     Assessment/Plan:  58 y.o. female with type B aortic dissection  NO signs or symptoms of malperfusion Continue cipro for UTI BP control with prn meds Plan is for TEVAR with possible carotid subclavian bypass on Monday   Dagoberto Ligas, PA-C [facility] [phone] 02/20/2019 8:08 AM   I have interviewed and examined patient with PA and agree with assessment and plan above.  Patient having what appears to be spasm pain in her abdomen this morning.  Will order muscle relaxer.  She is perfusing all of her extremities well.  Sherrel Shafer C. Donzetta Matters, MD [facility] [place] Office: [phone] Pager: [phone]

## 2019-02-20 NOTE — Plan of Care (Signed)
  Problem: Education: Goal: Knowledge of General Education information will improve Description Including pain rating scale, medication(s)/side effects and non-pharmacologic comfort measures Outcome: Progressing   Problem: Health Behavior/Discharge Planning: Goal: Ability to manage health-related needs will improve Outcome: Progressing   

## 2019-02-20 NOTE — Progress Notes (Addendum)
Pt began having intense  pain 10/10. No change in vitals. Pt seems to be hyperventilating. Advises pt to slow breathing.  Will monitor vitals q 15 mins. Will page PA.  Percocet 10mg  given 15 mins prior. Will continue to monitor pt.  Jerald Kief, RN

## 2019-02-20 NOTE — Progress Notes (Signed)
PA advised Dr Donzetta Matters on floor, he will see pt. Jerald Kief, RN

## 2019-02-21 LAB — URINE CULTURE: Culture: >=100000 — AB

## 2019-02-21 LAB — BASIC METABOLIC PANEL
Anion gap: 10 (ref 5–15)
BUN: 11 mg/dL (ref 6–20)
CO2: 25 mmol/L (ref 22–32)
Calcium: 9.3 mg/dL (ref 8.9–10.3)
Chloride: 102 mmol/L (ref 98–111)
Creatinine, Ser: 1.19 mg/dL — ABNORMAL HIGH (ref 0.44–1.00)
GFR calc Af Amer: 58 mL/min — ABNORMAL LOW (ref >60)
GFR calc non Af Amer: 50 mL/min — ABNORMAL LOW (ref >60)
Glucose, Bld: 107 mg/dL — ABNORMAL HIGH (ref 70–99)
Potassium: 4.1 mmol/L (ref 3.5–5.1)
Sodium: 137 mmol/L (ref 135–145)

## 2019-02-21 LAB — CBC
HCT: 33 % — ABNORMAL LOW (ref 36.0–46.0)
Hemoglobin: 11.3 g/dL — ABNORMAL LOW (ref 12.0–15.0)
MCH: 30.1 pg (ref 26.0–34.0)
MCHC: 34.2 g/dL (ref 30.0–36.0)
MCV: 88 fL (ref 80.0–100.0)
Platelets: 345 10*3/uL (ref 150–400)
RBC: 3.75 MIL/uL — ABNORMAL LOW (ref 3.87–5.11)
RDW: 12.2 % (ref 11.5–15.5)
WBC: 8.4 10*3/uL (ref 4.0–10.5)
nRBC: 0 % (ref 0.0–0.2)

## 2019-02-21 MED ORDER — SENNOSIDES-DOCUSATE SODIUM 8.6-50 MG PO TABS
1.0000 | ORAL_TABLET | Freq: Two times a day (BID) | ORAL | Status: DC | PRN
  Administered 2019-02-23: 1 via ORAL
  Filled 2019-02-21: qty 1

## 2019-02-21 NOTE — Progress Notes (Addendum)
  Progress Note    02/21/2019 7:57 AM  Subjective:  Has not had BM since admission   Vitals:   02/20/19 2308 02/21/19 0342  BP: 129/79 (!) 134/59  Pulse: 68   Resp: (!) 22 11  Temp: 97.6 F (36.4 C) 97.9 F (36.6 C)  SpO2: 98% 96%   Physical Exam: Lungs:  Non labored Incisions:  none Extremities:  Symmetrical DP pulses Abdomen:  soft Neurologic: A&O  CBC    Component Value Date/Time   WBC 8.4 02/21/2019 0308   RBC 3.75 (L) 02/21/2019 0308   HGB 11.3 (L) 02/21/2019 0308   HCT 33.0 (L) 02/21/2019 0308   PLT 345 02/21/2019 0308   MCV 88.0 02/21/2019 0308   MCH 30.1 02/21/2019 0308   MCHC 34.2 02/21/2019 0308   RDW 12.2 02/21/2019 0308   LYMPHSABS 4.1 (H) 01/18/2019 2339   MONOABS 1.1 (H) 01/18/2019 2339   EOSABS 0.3 01/18/2019 2339   BASOSABS 0.1 01/18/2019 2339    BMET    Component Value Date/Time   NA 137 02/21/2019 0308   K 4.1 02/21/2019 0308   CL 102 02/21/2019 0308   CO2 25 02/21/2019 0308   GLUCOSE 107 (H) 02/21/2019 0308   BUN 11 02/21/2019 0308   CREATININE 1.19 (H) 02/21/2019 0308   CALCIUM 9.3 02/21/2019 0308   GFRNONAA 50 (L) 02/21/2019 0308   GFRAA 58 (L) 02/21/2019 0308    INR No results found for: INR  No intake or output data in the 24 hours ending 02/21/19 0757   Assessment/Plan:  58 y.o. female  With type B aortic dissection  No signs or symptoms of malperfusion Prn laxative ordered NPO past midnight Consent has been signed Plan is for TEVAR with possible carotid subclavian bypass by Dr. Oneida Alar tomorrow   Dagoberto Ligas, PA-C [facility] [phone] 02/21/2019 7:57 AM  I have independently interviewed and examined patient and agree with PA assessment and plan above.  Patient is on scheduled for tomorrow second case for thoracic endograft and possible carotid subclavian bypass.  All of her questions were answered.  N.p.o. past midnight.  Khristian Phillippi C. Donzetta Matters, MD [facility] [place]  Office: [phone] Pager: [phone]

## 2019-02-22 ENCOUNTER — Inpatient Hospital Stay (HOSPITAL_COMMUNITY): Payer: 59 | Admitting: Registered Nurse

## 2019-02-22 ENCOUNTER — Other Ambulatory Visit (HOSPITAL_COMMUNITY): Payer: Self-pay | Admitting: *Deleted

## 2019-02-22 ENCOUNTER — Inpatient Hospital Stay (HOSPITAL_COMMUNITY): Payer: 59

## 2019-02-22 ENCOUNTER — Encounter (HOSPITAL_COMMUNITY)
Admission: EM | Disposition: A | Discharge status: Home / Self Care | Payer: Self-pay | Source: Home / Self Care | Attending: Vascular Surgery

## 2019-02-22 ENCOUNTER — Other Ambulatory Visit: Payer: 59

## 2019-02-22 DIAGNOSIS — I712 Thoracic aortic aneurysm, without rupture, unspecified: Secondary | ICD-10-CM

## 2019-02-22 DIAGNOSIS — I7101 Dissection of thoracic aorta: Secondary | ICD-10-CM

## 2019-02-22 HISTORY — PX: ULTRASOUND GUIDANCE FOR VASCULAR ACCESS: SHX6516

## 2019-02-22 HISTORY — PX: THORACIC AORTIC ENDOVASCULAR STENT GRAFT: SHX6112

## 2019-02-22 SURGERY — INSERTION, ENDOVASCULAR STENT GRAFT, AORTA, THORACIC
Anesthesia: General | Site: Groin | Laterality: Right

## 2019-02-22 MED ORDER — ESMOLOL HCL 100 MG/10ML IV SOLN
INTRAVENOUS | Status: DC | PRN
  Administered 2019-02-22: 10 mg via INTRAVENOUS
  Administered 2019-02-22 (×2): 20 mg via INTRAVENOUS

## 2019-02-22 MED ORDER — DEXAMETHASONE SODIUM PHOSPHATE 10 MG/ML IJ SOLN
INTRAMUSCULAR | Status: DC | PRN
  Administered 2019-02-22: 10 mg via INTRAVENOUS

## 2019-02-22 MED ORDER — ACETAMINOPHEN 500 MG PO TABS
1000.0000 mg | ORAL_TABLET | Freq: Once | ORAL | Status: DC
  Filled 2019-02-22: qty 2

## 2019-02-22 MED ORDER — ORAL CARE MOUTH RINSE
15.0000 mL | Freq: Two times a day (BID) | OROMUCOSAL | Status: DC
  Administered 2019-02-23 – 2019-02-24 (×3): 15 mL via OROMUCOSAL

## 2019-02-22 MED ORDER — FENTANYL CITRATE (PF) 250 MCG/5ML IJ SOLN
INTRAMUSCULAR | Status: DC | PRN
  Administered 2019-02-22: 100 ug via INTRAVENOUS

## 2019-02-22 MED ORDER — SODIUM CHLORIDE 0.9 % IV SOLN
INTRAVENOUS | Status: DC | PRN
  Administered 2019-02-22: 12:00:00 500 mL

## 2019-02-22 MED ORDER — MIDAZOLAM HCL 2 MG/2ML IJ SOLN
INTRAMUSCULAR | Status: AC
  Filled 2019-02-22: qty 2

## 2019-02-22 MED ORDER — POTASSIUM CHLORIDE CRYS ER 20 MEQ PO TBCR: 20.0000 meq | EXTENDED_RELEASE_TABLET | Freq: Every day | ORAL | Status: DC | PRN

## 2019-02-22 MED ORDER — LACTATED RINGERS IV SOLN
INTRAVENOUS | Status: DC | PRN
  Administered 2019-02-22 (×2): via INTRAVENOUS

## 2019-02-22 MED ORDER — BISACODYL 5 MG PO TBEC
5.0000 mg | DELAYED_RELEASE_TABLET | Freq: Every day | ORAL | Status: DC | PRN
  Administered 2019-02-23: 5 mg via ORAL
  Filled 2019-02-22: qty 1

## 2019-02-22 MED ORDER — DOCUSATE SODIUM 100 MG PO CAPS
100.0000 mg | ORAL_CAPSULE | Freq: Every day | ORAL | Status: DC
  Administered 2019-02-23 – 2019-02-24 (×2): 100 mg via ORAL
  Filled 2019-02-22 (×2): qty 1

## 2019-02-22 MED ORDER — HEPARIN SODIUM (PORCINE) 1000 UNIT/ML IJ SOLN
INTRAMUSCULAR | Status: DC | PRN
  Administered 2019-02-22: 5000 [IU] via INTRAVENOUS
  Administered 2019-02-22: 8000 [IU] via INTRAVENOUS

## 2019-02-22 MED ORDER — ONDANSETRON HCL 4 MG/2ML IJ SOLN
INTRAMUSCULAR | Status: DC | PRN
  Administered 2019-02-22: 4 mg via INTRAVENOUS

## 2019-02-22 MED ORDER — HYDROMORPHONE HCL 1 MG/ML IJ SOLN
0.5000 mg | INTRAMUSCULAR | Status: DC | PRN
  Administered 2019-02-22 – 2019-02-23 (×4): 1 mg via INTRAVENOUS
  Filled 2019-02-22 (×4): qty 1

## 2019-02-22 MED ORDER — SUCCINYLCHOLINE CHLORIDE 200 MG/10ML IV SOSY
PREFILLED_SYRINGE | INTRAVENOUS | Status: DC | PRN
  Administered 2019-02-22: 80 mg via INTRAVENOUS

## 2019-02-22 MED ORDER — LACTATED RINGERS IV SOLN
INTRAVENOUS | Status: DC | PRN
  Administered 2019-02-22: 12:00:00 via INTRAVENOUS

## 2019-02-22 MED ORDER — PHENYLEPHRINE 40 MCG/ML (10ML) SYRINGE FOR IV PUSH (FOR BLOOD PRESSURE SUPPORT)
PREFILLED_SYRINGE | INTRAVENOUS | Status: AC
  Filled 2019-02-22: qty 10

## 2019-02-22 MED ORDER — SODIUM CHLORIDE 0.9 % IV SOLN
INTRAVENOUS | Status: AC
  Filled 2019-02-22: qty 1.2

## 2019-02-22 MED ORDER — FENTANYL CITRATE (PF) 250 MCG/5ML IJ SOLN
INTRAMUSCULAR | Status: AC
  Filled 2019-02-22: qty 5

## 2019-02-22 MED ORDER — LIDOCAINE 2% (20 MG/ML) 5 ML SYRINGE
INTRAMUSCULAR | Status: AC
  Filled 2019-02-22: qty 5

## 2019-02-22 MED ORDER — ROCURONIUM BROMIDE 10 MG/ML (PF) SYRINGE
PREFILLED_SYRINGE | INTRAVENOUS | Status: AC
  Filled 2019-02-22: qty 10

## 2019-02-22 MED ORDER — 0.9 % SODIUM CHLORIDE (POUR BTL) OPTIME
TOPICAL | Status: DC | PRN
  Administered 2019-02-22: 12:00:00 1000 mL

## 2019-02-22 MED ORDER — ROCURONIUM BROMIDE 10 MG/ML (PF) SYRINGE
PREFILLED_SYRINGE | INTRAVENOUS | Status: DC | PRN
  Administered 2019-02-22: 50 mg via INTRAVENOUS

## 2019-02-22 MED ORDER — MAGNESIUM SULFATE 2 GM/50ML IV SOLN: 2.0000 g | Freq: Every day | INTRAVENOUS | Status: DC | PRN

## 2019-02-22 MED ORDER — PROPOFOL 10 MG/ML IV BOLUS
INTRAVENOUS | Status: AC
  Filled 2019-02-22: qty 20

## 2019-02-22 MED ORDER — PROTAMINE SULFATE 10 MG/ML IV SOLN
INTRAVENOUS | Status: DC | PRN
  Administered 2019-02-22: 10 mg via INTRAVENOUS
  Administered 2019-02-22 (×2): 20 mg via INTRAVENOUS

## 2019-02-22 MED ORDER — CHLORHEXIDINE GLUCONATE CLOTH 2 % EX PADS
6.0000 | MEDICATED_PAD | Freq: Every day | CUTANEOUS | Status: DC
  Administered 2019-02-23: 6 via TOPICAL

## 2019-02-22 MED ORDER — EPHEDRINE 5 MG/ML INJ
INTRAVENOUS | Status: AC
  Filled 2019-02-22: qty 10

## 2019-02-22 MED ORDER — CLEVIDIPINE BUTYRATE 0.5 MG/ML IV EMUL
INTRAVENOUS | Status: DC | PRN
  Administered 2019-02-22: 2 mg/h via INTRAVENOUS

## 2019-02-22 MED ORDER — LIDOCAINE 2% (20 MG/ML) 5 ML SYRINGE
INTRAMUSCULAR | Status: DC | PRN
  Administered 2019-02-22: 60 mg via INTRAVENOUS

## 2019-02-22 MED ORDER — VANCOMYCIN HCL 1000 MG IV SOLR
INTRAVENOUS | Status: AC
  Filled 2019-02-22: qty 1000

## 2019-02-22 MED ORDER — PROTAMINE SULFATE 10 MG/ML IV SOLN
INTRAVENOUS | Status: AC
  Filled 2019-02-22: qty 15

## 2019-02-22 MED ORDER — PHENYLEPHRINE 40 MCG/ML (10ML) SYRINGE FOR IV PUSH (FOR BLOOD PRESSURE SUPPORT)
PREFILLED_SYRINGE | INTRAVENOUS | Status: DC | PRN
  Administered 2019-02-22 (×2): 80 ug via INTRAVENOUS

## 2019-02-22 MED ORDER — SODIUM CHLORIDE 0.9 % IV SOLN
INTRAVENOUS | Status: DC | PRN
  Administered 2019-02-22: 12:00:00 25 ug/min via INTRAVENOUS

## 2019-02-22 MED ORDER — SODIUM CHLORIDE 0.9 % IV SOLN
INTRAVENOUS | Status: DC
  Administered 2019-02-22 (×2): via INTRAVENOUS

## 2019-02-22 MED ORDER — IODIXANOL 320 MG/ML IV SOLN
INTRAVENOUS | Status: DC | PRN
  Administered 2019-02-22: 80.8 mL via INTRA_ARTERIAL

## 2019-02-22 MED ORDER — VANCOMYCIN HCL 1000 MG IV SOLR
INTRAVENOUS | Status: DC | PRN
  Administered 2019-02-22: 12:00:00 1000 mg via INTRAVENOUS

## 2019-02-22 MED ORDER — LACTATED RINGERS IV SOLN: INTRAVENOUS | Status: DC

## 2019-02-22 MED ORDER — VANCOMYCIN HCL 500 MG IV SOLR
500.0000 mg | Freq: Two times a day (BID) | INTRAVENOUS | Status: AC
  Administered 2019-02-23 (×2): 500 mg via INTRAVENOUS
  Filled 2019-02-22 (×3): qty 500

## 2019-02-22 MED ORDER — MIDAZOLAM HCL 5 MG/5ML IJ SOLN
INTRAMUSCULAR | Status: DC | PRN
  Administered 2019-02-22: 2 mg via INTRAVENOUS

## 2019-02-22 MED ORDER — SUCCINYLCHOLINE CHLORIDE 200 MG/10ML IV SOSY
PREFILLED_SYRINGE | INTRAVENOUS | Status: AC
  Filled 2019-02-22: qty 10

## 2019-02-22 MED ORDER — SUGAMMADEX SODIUM 200 MG/2ML IV SOLN
INTRAVENOUS | Status: DC | PRN
  Administered 2019-02-22: 200 mg via INTRAVENOUS

## 2019-02-22 MED ORDER — EPHEDRINE SULFATE-NACL 50-0.9 MG/10ML-% IV SOSY
PREFILLED_SYRINGE | INTRAVENOUS | Status: DC | PRN
  Administered 2019-02-22 (×2): 5 mg via INTRAVENOUS

## 2019-02-22 MED ORDER — PROPOFOL 10 MG/ML IV BOLUS
INTRAVENOUS | Status: DC | PRN
  Administered 2019-02-22: 180 mg via INTRAVENOUS

## 2019-02-22 MED ORDER — SODIUM CHLORIDE 0.9 % IV SOLN: 500.0000 mL | Freq: Once | INTRAVENOUS | Status: DC | PRN

## 2019-02-22 MED ORDER — HEPARIN SODIUM (PORCINE) 1000 UNIT/ML IJ SOLN
INTRAMUSCULAR | Status: AC
  Filled 2019-02-22: qty 4

## 2019-02-22 SURGICAL SUPPLY — 73 items
ADH SKN CLS APL DERMABOND .7 (GAUZE/BANDAGES/DRESSINGS) ×3
AGENT HMST SPONGE THK3/8 (HEMOSTASIS)
CANISTER SUCT 3000ML PPV (MISCELLANEOUS) ×4 IMPLANT
CANNULA VESSEL 3MM 2 BLNT TIP (CANNULA) IMPLANT
CATH ACCU-VU SIZ PIG 5F 100CM (CATHETERS) ×2 IMPLANT
CATH BEACON 5.038 65CM KMP-01 (CATHETERS) IMPLANT
CATH ROBINSON RED A/P 18FR (CATHETERS) IMPLANT
CLIP VESOCCLUDE MED 24/CT (CLIP) ×4 IMPLANT
CLIP VESOCCLUDE SM WIDE 24/CT (CLIP) ×4 IMPLANT
COVER PROBE W GEL 5X96 (DRAPES) ×4 IMPLANT
COVER WAND RF STERILE (DRAPES) ×4 IMPLANT
CRADLE DONUT ADULT HEAD (MISCELLANEOUS) ×4 IMPLANT
DECANTER SPIKE VIAL GLASS SM (MISCELLANEOUS) IMPLANT
DERMABOND ADVANCED (GAUZE/BANDAGES/DRESSINGS) ×1
DERMABOND ADVANCED .7 DNX12 (GAUZE/BANDAGES/DRESSINGS) ×3 IMPLANT
DEVICE CLOSURE PERCLS PRGLD 6F (VASCULAR PRODUCTS) ×6 IMPLANT
DRAIN HEMOVAC 1/8 X 5 (WOUND CARE) IMPLANT
DRSG TEGADERM 2-3/8X2-3/4 SM (GAUZE/BANDAGES/DRESSINGS) ×4 IMPLANT
DRYSEAL FLEXSHEATH 20FR 33CM (SHEATH) ×1
ELECT REM PT RETURN 9FT ADLT (ELECTROSURGICAL) ×8
ELECTRODE REM PT RTRN 9FT ADLT (ELECTROSURGICAL) ×6 IMPLANT
EVACUATOR SILICONE 100CC (DRAIN) IMPLANT
GAUZE SPONGE 2X2 8PLY STRL LF (GAUZE/BANDAGES/DRESSINGS) ×1 IMPLANT
GAUZE SPONGE 4X4 12PLY STRL (GAUZE/BANDAGES/DRESSINGS) ×4 IMPLANT
GLOVE BIO SURGEON STRL SZ7.5 (GLOVE) ×4 IMPLANT
GLOVE BIOGEL PI IND STRL 7.5 (GLOVE) ×1 IMPLANT
GLOVE BIOGEL PI INDICATOR 7.5 (GLOVE) ×1
GOWN STRL REUS W/ TWL LRG LVL3 (GOWN DISPOSABLE) ×9 IMPLANT
GOWN STRL REUS W/TWL LRG LVL3 (GOWN DISPOSABLE) ×12
HEMOSTAT SPONGE AVITENE ULTRA (HEMOSTASIS) IMPLANT
INSERT FOGARTY SM (MISCELLANEOUS) IMPLANT
KIT BASIN OR (CUSTOM PROCEDURE TRAY) ×4 IMPLANT
KIT TURNOVER KIT B (KITS) ×4 IMPLANT
LOOP VESSEL MINI RED (MISCELLANEOUS) IMPLANT
NDL PERC 18GX7CM (NEEDLE) ×2 IMPLANT
NEEDLE 22X1 1/2 (OR ONLY) (NEEDLE) IMPLANT
NEEDLE PERC 18GX7CM (NEEDLE) ×4 IMPLANT
NS IRRIG 1000ML POUR BTL (IV SOLUTION) ×8 IMPLANT
PACK CAROTID (CUSTOM PROCEDURE TRAY) ×4 IMPLANT
PACK ENDOVASCULAR (PACKS) ×4 IMPLANT
PAD ARMBOARD 7.5X6 YLW CONV (MISCELLANEOUS) ×8 IMPLANT
PENCIL BUTTON HOLSTER BLD 10FT (ELECTRODE) ×2 IMPLANT
PERCLOSE PROGLIDE 6F (VASCULAR PRODUCTS) ×24
SHEATH AVANTI 11CM 8FR (SHEATH) IMPLANT
SHEATH DRYSEAL FLEX 20FR 33CM (SHEATH) ×1 IMPLANT
SHEATH PINNACLE 8F 10CM (SHEATH) ×2 IMPLANT
SHUNT CAROTID BYPASS 10 (VASCULAR PRODUCTS) IMPLANT
SPONGE GAUZE 2X2 STER 10/PKG (GAUZE/BANDAGES/DRESSINGS) ×1
SPONGE SURGIFOAM ABS GEL 100 (HEMOSTASIS) IMPLANT
STAPLER VISISTAT 35W (STAPLE) IMPLANT
STENT GRFT THORAC ACS 28X28X15 (Endovascular Graft) ×4 IMPLANT
STOPCOCK MORSE 400PSI 3WAY (MISCELLANEOUS) ×6 IMPLANT
SUT PROLENE 3 0 SH1 36 (SUTURE) IMPLANT
SUT PROLENE 5 0 C 1 24 (SUTURE) IMPLANT
SUT PROLENE 6 0 CC (SUTURE) ×4 IMPLANT
SUT PROLENE 7 0 BV 1 (SUTURE) IMPLANT
SUT VIC AB 2-0 SH 27 (SUTURE)
SUT VIC AB 2-0 SH 27XBRD (SUTURE) IMPLANT
SUT VIC AB 3-0 SH 27 (SUTURE)
SUT VIC AB 3-0 SH 27X BRD (SUTURE) ×2 IMPLANT
SUT VICRYL 4-0 PS2 18IN ABS (SUTURE) ×6 IMPLANT
SYR 30ML LL (SYRINGE) IMPLANT
SYR 50ML LL SCALE MARK (SYRINGE) ×4 IMPLANT
SYR CONTROL 10ML LL (SYRINGE) IMPLANT
SYR MEDRAD MARK V 150ML (SYRINGE) ×2 IMPLANT
TOWEL GREEN STERILE (TOWEL DISPOSABLE) ×4 IMPLANT
TRAY FOLEY MTR SLVR 16FR STAT (SET/KITS/TRAYS/PACK) ×4 IMPLANT
TUBING HIGH PRESSURE 120CM (CONNECTOR) ×4 IMPLANT
TUBING INJECTOR 48 (MISCELLANEOUS) ×2 IMPLANT
WATER STERILE IRR 1000ML POUR (IV SOLUTION) ×4 IMPLANT
WIRE BENTSON .035X145CM (WIRE) ×2 IMPLANT
WIRE ROSEN-J .035X260CM (WIRE) ×2 IMPLANT
WIRE STIFF LUNDERQUIST 260CM (WIRE) ×2 IMPLANT

## 2019-02-22 NOTE — Transfer of Care (Addendum)
Immediate Anesthesia Transfer of Care Note  Patient: Heather Pollard  Procedure(s) Performed: THORACIC AORTIC ENDOVASCULAR STENT GRAFT (N/A Abdomen) Ultrasound Guidance For Vascular Access (Right Groin)  Patient Location: PACU  Anesthesia Type:General  Level of Consciousness: awake, alert  and oriented  Airway & Oxygen Therapy: Patient Spontanous Breathing and Patient connected to face mask oxygen  Post-op Assessment: Report given to RN and Post -op Vital signs reviewed and stable  Post vital signs: Reviewed and stable  Last Vitals:  Vitals Value Taken Time  BP 142/69   Temp    Pulse 86   Resp 15   SpO2 100     Last Pain:  Vitals:   02/22/19 0920  TempSrc:   PainSc: Asleep     Report to PACU RN, lumbar drain level at tragus, at 10 cmH20 per Fields MD, catheter intact and waveform transduced. Cleviprex gtt maintained from OR at 4mg /hr.  Patients Stated Pain Goal: 2 (67/20/94 7096)  Complications: No apparent anesthesia complications

## 2019-02-22 NOTE — Anesthesia Procedure Notes (Signed)
Arterial Line Insertion Start/End6/04/2019 11:40 AM, 02/22/2019 11:45 AM Performed by: Gaylene Brooks, CRNA, CRNA  Patient location: Pre-op. Preanesthetic checklist: patient identified, IV checked, site marked, risks and benefits discussed, surgical consent, monitors and equipment checked, pre-op evaluation, timeout performed and anesthesia consent Lidocaine 1% used for infiltration Right, radial was placed Catheter size: 20 G Hand hygiene performed , maximum sterile barriers used  and Seldinger technique used Allen's test indicative of satisfactory collateral circulation Attempts: 1 Procedure performed without using ultrasound guided technique. Following insertion, dressing applied and Biopatch. Post procedure assessment: normal  Patient tolerated the procedure well with no immediate complications.

## 2019-02-22 NOTE — Anesthesia Procedure Notes (Signed)
Procedure Name: Intubation Date/Time: 02/22/2019 12:01 PM Performed by: Jearld Pies, CRNA Pre-anesthesia Checklist: Patient identified, Emergency Drugs available, Suction available and Patient being monitored Patient Re-evaluated:Patient Re-evaluated prior to induction Oxygen Delivery Method: Circle System Utilized Preoxygenation: Pre-oxygenation with 100% oxygen Induction Type: IV induction and Rapid sequence Laryngoscope Size: Mac and 3 Grade View: Grade I Tube type: Oral Tube size: 7.0 mm Number of attempts: 1 Airway Equipment and Method: Stylet and Oral airway Placement Confirmation: ETT inserted through vocal cords under direct vision,  positive ETCO2 and breath sounds checked- equal and bilateral Secured at: 21 cm Tube secured with: Tape Dental Injury: Teeth and Oropharynx as per pre-operative assessment

## 2019-02-22 NOTE — Interval H&P Note (Signed)
History and Physical Interval Note:  02/22/2019 10:29 AM  Heather Pollard  has presented today for surgery, with the diagnosis of [place].  The various methods of treatment have been discussed with the patient and family. After consideration of risks, benefits and other options for treatment, the patient has consented to  Procedure(s): THORACIC AORTIC ENDOVASCULAR STENT GRAFT WITH INTRAVASCULAR ULTRASOUND (N/A) BYPASS GRAFT CAROTID-SUBCLAVIAN LEFT (Left) as a surgical intervention.  The patient's history has been reviewed, patient examined, no change in status, stable for surgery.  I have reviewed the patient's chart and labs.  Questions were answered to the patient's satisfaction.     Ruta Hinds

## 2019-02-22 NOTE — Progress Notes (Signed)
Pt report called to short stay. CHG bath given. Vitals stable. Pt denies any complaints.  Jerald Kief, RN

## 2019-02-22 NOTE — Progress Notes (Signed)
[facility] [place]  Subjective  - awake and alert   Objective 122/70 86 (!) 97 F (36.1 C) 16 96%  Intake/Output Summary (Last 24 hours) at 02/22/2019 1436 Last data filed at 02/22/2019 1405 Gross per 24 hour  Intake 1620 ml  Output 540 ml  Net 1080 ml   2+ DP pulses moving feet  No groin hematoma  Assessment/Planning: Stable in PACU post TEVAR To ICU when room available  Ruta Hinds 02/22/2019 2:36 PM --  Laboratory Lab Results: Recent Labs    02/21/19 0308  WBC 8.4  HGB 11.3*  HCT 33.0*  PLT 345   BMET Recent Labs    02/21/19 0308  NA 137  K 4.1  CL 102  CO2 25  GLUCOSE 107*  BUN 11  CREATININE 1.19*  CALCIUM 9.3    COAG No results found for: INR, PROTIME No results found for: PTT

## 2019-02-22 NOTE — Anesthesia Procedure Notes (Signed)
Lumbar Drain  Start time: 02/22/2019 11:15 AM End time: 02/22/2019 11:35 AM  Staffing Anesthesiologist: Roderic Palau, MD Performed: anesthesiologist   Preanesthetic Checklist Completed: patient identified, site marked, surgical consent, pre-op evaluation, timeout performed, IV checked, risks and benefits discussed and monitors and equipment checked  Epidural Patient position: sitting Prep: site prepped and draped and DuraPrep Patient monitoring: continuous pulse ox, blood pressure, heart rate and cardiac monitor Approach: midline Location: L3-L4  Needle:  Needle type: Tuohy  Needle gauge: 16 G Needle length: 9 cm and 9 Needle insertion depth: 5 cm Catheter type: closed end flexible Catheter size: 19 Gauge Catheter at skin depth: 11 cm  Additional Notes This is a lumbar drain, not an epidural catheter.

## 2019-02-22 NOTE — Anesthesia Preprocedure Evaluation (Addendum)
Anesthesia Evaluation  Patient identified by MRN, date of birth, ID band Patient awake    Reviewed: Allergy & Precautions, H&P , NPO status , Patient's Chart, lab work & pertinent test results  Airway Mallampati: II  TM Distance: >3 FB Neck ROM: Full    Dental no notable dental hx. (+) Teeth Intact, Dental Advisory Given   Pulmonary neg pulmonary ROS, former smoker,    Pulmonary exam normal breath sounds clear to auscultation       Cardiovascular hypertension, + Peripheral Vascular Disease   Rhythm:Regular Rate:Normal     Neuro/Psych negative neurological ROS  negative psych ROS   GI/Hepatic negative GI ROS, Neg liver ROS,   Endo/Other  negative endocrine ROS  Renal/GU negative Renal ROS  negative genitourinary   Musculoskeletal   Abdominal   Peds  Hematology negative hematology ROS (+)   Anesthesia Other Findings   Reproductive/Obstetrics negative OB ROS                            Anesthesia Physical Anesthesia Plan  ASA: III  Anesthesia Plan: General   Post-op Pain Management:    Induction: Intravenous  PONV Risk Score and Plan: 4 or greater and Ondansetron, Dexamethasone and Midazolam  Airway Management Planned: Oral ETT  Additional Equipment: Arterial line and Spinal Drain  Intra-op Plan:   Post-operative Plan: Extubation in OR  Informed Consent: I have reviewed the patients History and Physical, chart, labs and discussed the procedure including the risks, benefits and alternatives for the proposed anesthesia with the patient or authorized representative who has indicated his/her understanding and acceptance.     Dental advisory given  Plan Discussed with: CRNA  Anesthesia Plan Comments:         Anesthesia Quick Evaluation

## 2019-02-22 NOTE — Anesthesia Postprocedure Evaluation (Signed)
Anesthesia Post Note  Patient: Heather Pollard  Procedure(s) Performed: THORACIC AORTIC ENDOVASCULAR STENT GRAFT (N/A Abdomen) Ultrasound Guidance For Vascular Access (Right Groin)     Patient location during evaluation: PACU Anesthesia Type: General Level of consciousness: awake and alert Pain management: pain level controlled Vital Signs Assessment: post-procedure vital signs reviewed and stable Respiratory status: spontaneous breathing, nonlabored ventilation and respiratory function stable Cardiovascular status: blood pressure returned to baseline and stable Postop Assessment: no apparent nausea or vomiting Anesthetic complications: no    Last Vitals:  Vitals:   02/22/19 1500 02/22/19 1515  BP: 122/64   Pulse: 88 86  Resp: 16 14  Temp:    SpO2: 92% 93%    Last Pain:  Vitals:   02/22/19 1430  TempSrc:   PainSc: Asleep                 Tabbitha Janvrin,W. EDMOND

## 2019-02-22 NOTE — Progress Notes (Signed)
Admitted to ICU. Right groin site patient states is tender upon palpation and without as well. ICP set up, [pop off 10 running low initially. Fluid clear small amount. Alert and oriented  Sensation good in all extremities. Patient called husband to update him

## 2019-02-22 NOTE — Progress Notes (Signed)
PHARMACY NOTE:  ANTIMICROBIAL RENAL DOSAGE ADJUSTMENT  Current antimicrobial regimen includes a mismatch between antimicrobial dosage and estimated renal function.  As per policy approved by the Pharmacy & Therapeutics and Medical Executive Committees, the antimicrobial dosage will be adjusted accordingly.  Current antimicrobial dosage:  Vanc 1gm IV Q12H x 2 doses  Indication: surgical prophylaxis  Renal Function:  Estimated Creatinine Clearance: 47.7 mL/min (A) (by C-G formula based on SCr of 1.19 mg/dL (H)). []      On intermittent HD, scheduled: []      On CRRT    Antimicrobial dosage has been changed to:  Vang 500mg  IV Q12H x 2 doses   Heather Pollard, PharmD, BCPS, [place] 02/22/2019, 3:52 PM

## 2019-02-22 NOTE — Op Note (Signed)
Procedure: Endovascular repair of type B aortic dissection (Gore device 28 x 28 x 15, 28 x 28 x 15 extending from just distal to the left subclavian to the celiac origin)  Preoperative diagnosis: Type B aortic dissection, symptomatic  Postoperative diagnosis: Same  Anesthesia: General  Assistant: Gerri Lins, PA-C  Operative findings: #120 French dry seal sheath right common femoral artery  2.  Main body device 28 x 28 x 15 extending from just distal to left subclavian and an extension 28 x 28 x 15 extending to just above the celiac axis  Operative details: After obtaining informed consent, the patient was taken the operating.  The patient was placed in supine position operating table.  Spinal cord drainage had been placed preoperatively.  This was monitored intraoperatively.  A Foley catheter was placed after induction of general anesthesia and endotracheal intubation.  Next the patient was prepped and draped in usual sterile fashion from the chin to the knees.  Ultrasound was used to identify the right common femoral artery and femoral bifurcation.  An introducer needle was used to cannulate the right common femoral artery and an 035 versa core wire threaded up into the right iliac system.  Tract was dilated with a 9 [place] dilator.  Proglide device was then inserted over the guidewire and deployed at the 9 o'clock position.  An additional Proglide was then fired in the contralateral 3 o'clock position and this also fired well.  At this point the 9 French sheath was placed over the guidewire and a 5 French long pigtail marker catheter was advanced over the guidewire up to the ascending aorta.  An 035 Lunderquist wire with the floppy tip was then advanced through the pigtail catheter and placed into the ascending aorta up to the aortic valve.  A 20 French dry seal sheath was then placed over the Lunderquist wire into the upper portion of the abdominal aorta.  A 28 x 28 x 15 Gore excluder device  was then advanced up to the area of the left subclavian.  Contrast angiogram was performed to confirm the location of the left subclavian.  The device was deployed with good wall apposition with good coverage of the entire lesser and greater curves of the aorta.  Contrast angiogram was used to determine the location of the celiac origin.  A 28 x 28 x 15 cm extension was then brought up in the operative field and deployed with about 5 cm overlap.  Completion angiogram again showed a patent left subclavian with still some flow into the false channel up to the diaphragm but not as much as before.  There was patency of the left subclavian no type I or type II endoleak and patency of the celiac axis.  At this point the delivery system was removed.  The sheath was removed and the Proglide sutures secured down.  An additional proglide was fired to assist with hemostasis.  There still was some oozing around the sutures and pressure was held for about 10 minutes.  The hemostasis improved significantly.  I pulled the guidewire.  Hemostasis was then obtained with another 10 minutes of direct pressure.  The insertion site was then closed with a Vicryl stitch and Dermabond applied.  The patient tolerated the procedure well and there were no complications.  Patient was extubated in the operating room and taken to the recovery room in stable condition.  Ruta Hinds, MD [facility] [place] Office: [phone] Pager: [phone]

## 2019-02-23 ENCOUNTER — Encounter (HOSPITAL_COMMUNITY): Payer: Self-pay | Admitting: Vascular Surgery

## 2019-02-23 LAB — CBC
HCT: 31.4 % — ABNORMAL LOW (ref 36.0–46.0)
Hemoglobin: 10.5 g/dL — ABNORMAL LOW (ref 12.0–15.0)
MCH: 29.5 pg (ref 26.0–34.0)
MCHC: 33.4 g/dL (ref 30.0–36.0)
MCV: 88.2 fL (ref 80.0–100.0)
Platelets: 311 10*3/uL (ref 150–400)
RBC: 3.56 MIL/uL — ABNORMAL LOW (ref 3.87–5.11)
RDW: 12.1 % (ref 11.5–15.5)
WBC: 15.1 10*3/uL — ABNORMAL HIGH (ref 4.0–10.5)
nRBC: 0 % (ref 0.0–0.2)

## 2019-02-23 LAB — BASIC METABOLIC PANEL
Anion gap: 11 (ref 5–15)
BUN: 13 mg/dL (ref 6–20)
CO2: 21 mmol/L — ABNORMAL LOW (ref 22–32)
Calcium: 8.8 mg/dL — ABNORMAL LOW (ref 8.9–10.3)
Chloride: 105 mmol/L (ref 98–111)
Creatinine, Ser: 1.17 mg/dL — ABNORMAL HIGH (ref 0.44–1.00)
GFR calc Af Amer: 59 mL/min — ABNORMAL LOW (ref >60)
GFR calc non Af Amer: 51 mL/min — ABNORMAL LOW (ref >60)
Glucose, Bld: 122 mg/dL — ABNORMAL HIGH (ref 70–99)
Potassium: 4 mmol/L (ref 3.5–5.1)
Sodium: 137 mmol/L (ref 135–145)

## 2019-02-23 LAB — POCT ACTIVATED CLOTTING TIME
Activated Clotting Time: 235 seconds
Activated Clotting Time: 307 seconds

## 2019-02-23 MED ORDER — ATORVASTATIN CALCIUM 10 MG PO TABS
10.0000 mg | ORAL_TABLET | Freq: Every day | ORAL | Status: DC
  Administered 2019-02-23: 10 mg via ORAL
  Filled 2019-02-23: qty 1

## 2019-02-23 NOTE — Addendum Note (Signed)
Addendum  created 02/23/19 1350 by Murvin Natal, MD   Clinical Note Signed

## 2019-02-23 NOTE — Progress Notes (Signed)
Lumbar drain removed, tip intact.

## 2019-02-23 NOTE — Progress Notes (Signed)
Patient arrived from [facility] to 4E room 6.  Telemetry monitor applied and CCMD notified.  CHG bath completed.  Patient oriented to unit and room to include call light and phone.  Will continue to monitor.

## 2019-02-23 NOTE — Progress Notes (Signed)
[facility] [place]  Subjective  - chest pain resolved   Objective (!) 119/57 80 98.1 F (36.7 C) (Oral) 14 93%  Intake/Output Summary (Last 24 hours) at 02/23/2019 0942 Last data filed at 02/23/2019 0900 Gross per 24 hour  Intake 3585.56 ml  Output 2414 ml  Net 1171.56 ml   Right groin no hematoma 2+ DP pulses Moves lower extremities Abdomen soft  Assessment/Planning: POD #1 s/p TEVAR Dissection now Asx To 4e most likely home tomorrow  D/c foley  Ruta Hinds 02/23/2019 9:42 AM --  Laboratory Lab Results: Recent Labs    02/21/19 0308 02/23/19 0420  WBC 8.4 15.1*  HGB 11.3* 10.5*  HCT 33.0* 31.4*  PLT 345 311   BMET Recent Labs    02/21/19 0308 02/23/19 0420  NA 137 137  K 4.1 4.0  CL 102 105  CO2 25 21*  GLUCOSE 107* 122*  BUN 11 13  CREATININE 1.19* 1.17*  CALCIUM 9.3 8.8*    COAG No results found for: INR, PROTIME No results found for: PTT

## 2019-02-24 LAB — BPAM RBC
Blood Product Expiration Date: 202007142359
Blood Product Expiration Date: 202007142359
Unit Type and Rh: 5100
Unit Type and Rh: 5100

## 2019-02-24 LAB — TYPE AND SCREEN
ABO/RH(D): O POS
Antibody Screen: NEGATIVE
Unit division: 0
Unit division: 0

## 2019-02-24 MED ORDER — OXYCODONE-ACETAMINOPHEN 5-325 MG PO TABS: 1.0000 | ORAL_TABLET | ORAL | 0 refills | Status: DC | PRN

## 2019-02-24 NOTE — Discharge Instructions (Signed)
   [facility] [place]   Discharge Instructions  Endovascular Aortic Aneurysm Repair  Please refer to the following instructions for your post-procedure care. Your surgeon or Physician Assistant will discuss any changes with you.  Activity  You are encouraged to walk as much as you can. You can slowly return to normal activities but must avoid strenuous activity and heavy lifting until your doctor tells you it's OK. Avoid activities such as vacuuming or swinging a gold club. It is normal to feel tired for several weeks after your surgery. Do not drive until your doctor gives the OK and you are no longer taking prescription pain medications. It is also normal to have difficulty with sleep habits, eating, and bowel movements after surgery. These will go away with time.  Bathing/Showering  You may shower after you go home. If you have an incision, do not soak in a bathtub, hot tub, or swim until the incision heals completely.  Incision Care  Shower every day. Clean your incision with mild soap and water. Pat the area dry with a clean towel. You do not need a bandage unless otherwise instructed. Do not apply any ointments or creams to your incision. If you clothing is irritating, you may cover your incision with a dry gauze pad.  Diet  Resume your normal diet. There are no special food restrictions following this procedure. A low fat/low cholesterol diet is recommended for all patients with vascular disease. In order to heal from your surgery, it is CRITICAL to get adequate nutrition. Your body requires vitamins, minerals, and protein. Vegetables are the best source of vitamins and minerals. Vegetables also provide the perfect balance of protein. Processed food has little nutritional value, so try to avoid this.  Medications  Resume taking all of your medications unless your doctor or nurse practitioner tells you not to. If your incision is causing pain, you may take  over-the-counter pain relievers such as acetaminophen (Tylenol). If you were prescribed a stronger pain medication, please be aware these medications can cause nausea and constipation. Prevent nausea by taking the medication with a snack or meal. Avoid constipation by drinking plenty of fluids and eating foods with a high amount of fiber, such as fruits, vegetables, and grains. Do not take Tylenol if you are taking prescription pain medications.   Follow up  Our office will schedule a follow-up appointment with a C.T. scan 3-4 weeks after your surgery.  Please call us immediately for any of the following conditions  Severe or worsening pain in your legs or feet or in your abdomen back or chest. Increased pain, redness, drainage (pus) from your incision sit. Increased abdominal pain, bloating, nausea, vomiting or persistent diarrhea. Fever of 101 degrees or higher. Swelling in your leg (s),  Reduce your risk of vascular disease  Stop smoking. If you would like help call [facility] at 1-800-QUIT-NOW (1-800-784-8669) or [place] at 336-586-4000. Manage your cholesterol Maintain a desired weight Control your diabetes Keep your blood pressure down  If you have questions, please call the office at [phone].   

## 2019-02-24 NOTE — Plan of Care (Signed)
Care plans reviewed and patient is adequate for discharge.  

## 2019-02-24 NOTE — Progress Notes (Addendum)
[facility] [place]  Subjective  - Doing well   Objective 132/83 84 98.4 F (36.9 C) (Oral) 19 96%  Intake/Output Summary (Last 24 hours) at 02/24/2019 3646 Last data filed at 02/23/2019 2113 Gross per 24 hour  Intake 712.83 ml  Output 1127 ml  Net -414.17 ml    Right groin soft without hematoma Palpable DP B LE Moving all 4 extremities, no neurologic deficits Abdomin soft  Assessment/Planning: POD # 2 TEVAR  She has walked, tolerating POs, has abdominal BS. Plan discharged home F/U with Dr. Oneida Alar in 4 weeks with CTA chest/abd/pelvis  Roxy Horseman 02/24/2019 7:28 AM --   Agree with above D/c home  Ruta Hinds, MD [facility] [place] Office: [phone] Pager: [phone]  Laboratory Lab Results: Recent Labs    02/23/19 0420  WBC 15.1*  HGB 10.5*  HCT 31.4*  PLT 311   BMET Recent Labs    02/23/19 0420  NA 137  K 4.0  CL 105  CO2 21*  GLUCOSE 122*  BUN 13  CREATININE 1.17*  CALCIUM 8.8*    COAG No results found for: INR, PROTIME No results found for: PTT

## 2019-02-24 NOTE — Progress Notes (Signed)
Mrs. Sokoloski given discharge instructions.  Discussed signs and symptoms to watch for and when to contact the physician.  Discussed medication changes, new medications and side effects.  Discussed follow up appointments.  Verbalized understanding.

## 2019-02-25 ENCOUNTER — Ambulatory Visit: Payer: 59 | Admitting: Vascular Surgery

## 2019-02-26 NOTE — Discharge Summary (Signed)
[facility] Discharge Summary   Patient ID:  Heather Pollard MRN: [id number] DOB/AGE: Jul 03, 1961 58 y.o.  Admit date: 02/17/2019 Discharge date: 02/24/2019 Date of Surgery: 02/22/2019 Surgeon: Surgeon(s): Elam Dutch, MD  Admission Diagnosis: Dissection of thoracoabdominal aorta [facility]) [I71.03]  Discharge Diagnoses:  Dissection of thoracoabdominal aorta ([place]) [I71.03]  Secondary Diagnoses: Past Medical History:  Diagnosis Date  . Ectopic pregnancy   . Thoracic aortic aneurysm ([place]) 02/2019  . Tubal pregnancy     Procedure(s): THORACIC AORTIC ENDOVASCULAR STENT GRAFT Ultrasound Guidance For Vascular Access  Discharged Condition: stable  HPI:  Heather Pollard is a 58 y.o. female who was admitted on 01/19/2019 with acute type B aortic dissection.  She had sudden onset of tearing sensation in her back going through to her anterior abdominal chest wall.  She is a smoker.  She does not use cocaine.  No family history of aortic dissection.  Did have hypertension in the emergency room and was admitted.  Work-up included a CT scan at that time.  Her blood pressure was well maintained and her pain was under control and she was discharged to home on 01/25/2019.  Plan was for follow-up with Dr. Oneida Alar in our office.  She presents to the emergency room today with recurrent tearing sensation in her chest and substernal area.   The patient was admitted for pain control.  Explained that with a 5 mm increase in size over 1 month would recommend repair during this admission.   Hospital Course:  Heather Pollard is a 58 y.o. female is S/P { Procedure(s): THORACIC AORTIC ENDOVASCULAR STENT GRAFT Ultrasound Guidance For Vascular Access   Consults:  Treatment Team:  Elam Dutch, MD   Admitted to the ICU with lumbar drain in place.  Post op asymptomatic moving all 4 extremities, pain controlled. Lumbar drain d/c'd post op day 1 without problems.  F/U with Dr. Oneida Alar in  4 weeks with CTA chest/abd/pelvis.  Discharged home in stable condition POD#2.    Significant Diagnostic Studies: CBC Lab Results  Component Value Date   WBC 15.1 (H) 02/23/2019   HGB 10.5 (L) 02/23/2019   HCT 31.4 (L) 02/23/2019   MCV 88.2 02/23/2019   PLT 311 02/23/2019    BMET    Component Value Date/Time   NA 137 02/23/2019 0420   K 4.0 02/23/2019 0420   CL 105 02/23/2019 0420   CO2 21 (L) 02/23/2019 0420   GLUCOSE 122 (H) 02/23/2019 0420   BUN 13 02/23/2019 0420   CREATININE 1.17 (H) 02/23/2019 0420   CALCIUM 8.8 (L) 02/23/2019 0420   GFRNONAA 51 (L) 02/23/2019 0420   GFRAA 59 (L) 02/23/2019 0420   COAG No results found for: INR, PROTIME   Disposition:  Discharge to :Home Discharge Instructions    Call MD for:  redness, tenderness, or signs of infection (pain, swelling, bleeding, redness, odor or green/yellow discharge around incision site)   Complete by: As directed    Call MD for:  severe or increased pain, loss or decreased feeling  in affected limb(s)   Complete by: As directed    Call MD for:  temperature >100.5   Complete by: As directed    Resume previous diet   Complete by: As directed      Allergies as of 02/24/2019      Reactions   Penicillins Hives, Diarrhea   Did it involve swelling of the face/tongue/throat, SOB, or low BP? No Did it involve sudden or severe rash/hives, skin  peeling, or any reaction on the inside of your mouth or nose? No Did you need to seek medical attention at a hospital or doctor's office? No When did it last happen? less than 10 years If all above answers are "NO", may proceed with cephalosporin use.      Medication List    TAKE these medications   acetaminophen 500 MG tablet Commonly known as: TYLENOL Take 1,000 mg by mouth every 6 (six) hours as needed for mild pain.   amLODipine 10 MG tablet Commonly known as: NORVASC Take 1 tablet (10 mg total) by mouth daily.   aspirin 81 MG EC tablet Take 1 tablet  (81 mg total) by mouth daily.   carvedilol 25 MG tablet Commonly known as: COREG Take 1 tablet (25 mg total) by mouth 2 (two) times daily with a meal.   hydrALAZINE 25 MG tablet Commonly known as: APRESOLINE Take 1 tablet (25 mg total) by mouth every 8 (eight) hours.   oxyCODONE-acetaminophen 5-325 MG tablet Commonly known as: PERCOCET/ROXICET Take 1 tablet by mouth every 6 (six) hours as needed for moderate pain. What changed: Another medication with the same name was added. Make sure you understand how and when to take each.   oxyCODONE-acetaminophen 5-325 MG tablet Commonly known as: PERCOCET/ROXICET Take 1 tablet by mouth every 4 (four) hours as needed for moderate pain. What changed: You were already taking a medication with the same name, and this prescription was added. Make sure you understand how and when to take each.      Verbal and written Discharge instructions given to the patient. Wound care per Discharge AVS Follow-up Information    Sherren KernsFields, Charles E, MD Follow up in 4 week(s).   Specialties: Vascular Surgery, Cardiology Why: office will call Contact information: [address] [place] [address] [phone]           Signed: Mosetta Pigeonmma Maureen Jesslynn Kruck 02/26/2019, 8:31 AM - For VQI Registry use --- Instructions: Press F2 to tab through selections.  Delete question if not applicable.   Post-op:  Time to Extubation: [x ] In OR, [ ]  < 12 hrs, [ ]  12-24 hrs, [ ]  >=24 hrs Vasopressors Req. Post-op: No MI: [ ]  No, [ ]  Troponin only, [ ]  EKG or Clinical New Arrhythmia: No CHF: No ICU Stay: 1 days Transfusion: No  If yes, 0 units given  Complications: Resp failure: [x ] none, [ ]  Pneumonia, [ ]  Ventilator Chg in renal function: [x ] none, [ ]  Inc. Cr > 0.5, [ ]  Temp. Dialysis, [ ]  Permanent dialysis Leg ischemia: [x ] No, [ ]  Yes, no Surgery needed, [ ]  Yes, Surgery needed, [ ]  Amputation Bowel ischemia: [x ] No, [ ]  Medical Rx, [ ]  Surgical Rx Wound  complication: [x ] No, [ ]  Superficial separation/infection, [ ]  Return to OR Return to OR: No  Return to OR for bleeding: No Stroke: [x ] None, [ ]  Minor, [ ]  Major  Discharge medications: Statin use:  No  for medical reason   ASA use:  Yes Plavix use:  No  for medical reason   Beta blocker use:  Yes

## 2019-02-28 ENCOUNTER — Other Ambulatory Visit: Payer: Self-pay | Admitting: Cardiovascular Disease

## 2019-03-02 ENCOUNTER — Other Ambulatory Visit: Payer: Self-pay

## 2019-03-02 ENCOUNTER — Telehealth: Payer: Self-pay

## 2019-03-02 ENCOUNTER — Other Ambulatory Visit: Payer: Self-pay | Admitting: Cardiovascular Disease

## 2019-03-02 NOTE — Telephone Encounter (Signed)
Pt called and said that she needed refills on her medications. Called to verify which meds and she is needing her BP meds. Advised her that these would need to come from Dr Burt Knack and looking at the chart, there is a pending order that was done for these on the 14th.   Pt was notified.   York Cerise, CMA

## 2019-03-02 NOTE — Telephone Encounter (Signed)
Pt calling requesting a refill on hydralazine, carvedilol and amlodipine,. Pt stated that she saw Dr. Burt Knack in the hospital and was prescribed these medications. Pt has an appt with Bhagat,PA on 03/17/19, virtual visit. Would Dr. Burt Knack like to refill these medications? Please address

## 2019-03-02 NOTE — Telephone Encounter (Signed)
New Message     *STAT* If patient is at the pharmacy, call can be transferred to refill team.   1. Which medications need to be refilled? (please list name of each medication and dose if known) Amlodipine 10mg  1 tablet daily, Hydralazine 25mg  1 tablet every 8 hours, Carvedilol 25mg  1 tablet twice a day.  2. Which pharmacy/location (including street and city if local pharmacy) is medication to be sent to? [facility] on [address]   3. Do they need a 30 day or 90 day supply? 90 day supply

## 2019-03-03 MED ORDER — CARVEDILOL 25 MG PO TABS: 25.0000 mg | ORAL_TABLET | Freq: Two times a day (BID) | ORAL | 0 refills | Status: DC

## 2019-03-03 MED ORDER — HYDRALAZINE HCL 25 MG PO TABS: 25.0000 mg | ORAL_TABLET | Freq: Three times a day (TID) | ORAL | 0 refills | Status: DC

## 2019-03-03 MED ORDER — AMLODIPINE BESYLATE 10 MG PO TABS: 10.0000 mg | ORAL_TABLET | Freq: Every day | ORAL | 0 refills | Status: DC

## 2019-03-05 ENCOUNTER — Ambulatory Visit (HOSPITAL_COMMUNITY): Payer: Self-pay

## 2019-03-05 DIAGNOSIS — I712 Thoracic aortic aneurysm, without rupture, unspecified: Secondary | ICD-10-CM

## 2019-03-10 ENCOUNTER — Telehealth: Payer: Self-pay | Admitting: *Deleted

## 2019-03-10 NOTE — Telephone Encounter (Signed)
Call placed to pt re: appt 03/17/2019.  Pt has agreed to change to in-ofifce and she has answered no to the following covid19 screening questions below.        COVID-19 Pre-Screening Questions:  . In the past 7 to 10 days have you had a cough,  shortness of breath, headache, congestion, fever (100 or greater) body aches, chills, sore throat, or sudden loss of taste or sense of smell? . Have you been around anyone with known Covid 19. . Have you been around anyone who is awaiting Covid 19 test results in the past 7 to 10 days? . Have you been around anyone who has been exposed to Covid 19, or has mentioned symptoms of Covid 19 within the past 7 to 10 days?  If you have any concerns/questions about symptoms patients report during screening (either on the phone or at threshold). Contact the provider seeing the patient or DOD for further guidance.  If neither are available contact a member of the leadership team.

## 2019-03-16 ENCOUNTER — Telehealth: Payer: Self-pay | Admitting: Physician Assistant

## 2019-03-16 NOTE — Progress Notes (Signed)
Cardiology Office Note    Date:  03/17/2019   ID:  Heather Pollard, DOB Dec 15, 1960, MRN [id number]  PCP:  Patient, No Pcp Per  Cardiologist:  Dr. Excell Seltzerooper   Chief Complaint: Hospital follow up  History of Present Illness:   Heather Pollard is a 58 y.o. female with hx of recent admission for type B aortic dissection, HTN and tobacco abuse seen for hospital follow up.   [facility]/2019 for acute Type B aortic dissection and hypertensive urgency. The patient was treated conservatively and discharged. However presented 02/17/2019 with  recurrent tearing sensation in her chest and substernal area. Work up reveled  5 mm increase in size over 1 month and under went TEVAR. No complications.  Here for follow up. SBP runs in 110s at home. She has some atypical pain at R lower rib cage. Walks 50 feet at a time.  She denies shortness of breath, palpitation, orthopnea, PND or syncope, lower.  No fever, chills, cough, congestion or COVID-19 exposure.   Past Medical History:  Diagnosis Date  . Ectopic pregnancy   . Thoracic aortic aneurysm (HCC) 02/2019  . Tubal pregnancy     Past Surgical History:  Procedure Laterality Date  . BREAST SURGERY    . ECTOPIC PREGNANCY SURGERY    . PARTIAL HYSTERECTOMY    . THORACIC AORTIC ENDOVASCULAR STENT GRAFT N/A 02/22/2019   Procedure: THORACIC AORTIC ENDOVASCULAR STENT GRAFT;  Surgeon: Sherren KernsFields, Charles E, MD;  [facility];  Service: Vascular;  Laterality: N/A;  . ULTRASOUND GUIDANCE FOR VASCULAR ACCESS Right 02/22/2019   Procedure: Ultrasound Guidance For Vascular Access;  Surgeon: Sherren KernsFields, Charles E, MD;  [facility] OR;  Service: Vascular;  Laterality: Right;    Current Medications: Prior to Admission medications   Medication Sig Start Date End Date Taking? Authorizing Provider  acetaminophen (TYLENOL) 500 MG tablet Take 1,000 mg by mouth every 6 (six) hours as needed for mild pain.    [provider]  amLODipine (NORVASC) 10 MG tablet  Take 1 tablet (10 mg total) by mouth daily. 03/03/19   Tonny Bollmanooper, Michael, MD  aspirin EC 81 MG EC tablet Take 1 tablet (81 mg total) by mouth daily. 01/26/19   Emilie RutterEveland, Matthew, PA-C  carvedilol (COREG) 25 MG tablet Take 1 tablet (25 mg total) by mouth 2 (two) times daily with a meal. 03/03/19   Tonny Bollmanooper, Michael, MD  hydrALAZINE (APRESOLINE) 25 MG tablet Take 1 tablet (25 mg total) by mouth every 8 (eight) hours. 03/03/19   Tonny Bollmanooper, Michael, MD  oxyCODONE-acetaminophen (PERCOCET/ROXICET) 5-325 MG tablet Take 1 tablet by mouth every 6 (six) hours as needed for moderate pain. Patient not taking: Reported on 02/17/2019 01/25/19   Emilie RutterEveland, Matthew, PA-C  oxyCODONE-acetaminophen (PERCOCET/ROXICET) 5-325 MG tablet Take 1 tablet by mouth every 4 (four) hours as needed for moderate pain. 02/24/19   Lars Mageollins, Emma M, PA-C    Allergies:   Penicillins   Social History   Socioeconomic History  . Marital status: Married    Spouse name: Not on file  . Number of children: Not on file  . Years of education: Not on file  . Highest education level: Not on file  Occupational History  . Not on file  Social Needs  . Financial resource strain: Not on file  . Food insecurity    Worry: Not on file    Inability: Not on file  . Transportation needs    Medical: Not on file    Non-medical: Not on  file  Tobacco Use  . Smoking status: Former Smoker    Types: Cigarettes    Quit date: 01/25/2019    Years since quitting: 0.1  . Smokeless tobacco: Never Used  Substance and Sexual Activity  . Alcohol use: No  . Drug use: Never  . Sexual activity: Not on file  Lifestyle  . Physical activity    Days per week: Not on file    Minutes per session: Not on file  . Stress: Not on file  Relationships  . Social Herbalist on phone: Not on file    Gets together: Not on file    Attends religious service: Not on file    Active member of club or organization: Not on file    Attends meetings of clubs or organizations:  Not on file    Relationship status: Not on file  Other Topics Concern  . Not on file  Social History Narrative  . Not on file     Family History:  The patient's family history is not on file.   ROS:   Please see the history of present illness.    ROS All other systems reviewed and are negative.   PHYSICAL EXAM:   VS:  BP 118/62   Pulse 80   Ht 5\' 6"  (1.676 m)   Wt 144 lb 12.8 oz (65.7 kg)   SpO2 98%   BMI 23.37 kg/m    GEN: Well nourished, well developed, in no acute distress  HEENT: normal  Neck: no JVD, carotid bruits, or masses Cardiac: RRR; no murmurs, rubs, or gallops,no edema  Respiratory:  clear to auscultation bilaterally, normal work of breathing GI: soft, tender to palpation at right upper quadrant/right lower rib cage nondistended, + BS MS: no deformity or atrophy  Skin: warm and dry, no rash Neuro:  Alert and Oriented x 3, Strength and sensation are intact Psych: euthymic mood, full affect  Wt Readings from Last 3 Encounters:  03/17/19 144 lb 12.8 oz (65.7 kg)  02/23/19 159 lb 13.3 oz (72.5 kg)  01/23/19 160 lb (72.6 kg)      Studies/Labs Reviewed:   EKG:  EKG is not ordered today.   Recent Labs: 01/18/2019: B Natriuretic Peptide 71.7 01/19/2019: Magnesium 1.9 02/17/2019: ALT 22 02/23/2019: BUN 13; Creatinine, Ser 1.17; Hemoglobin 10.5; Platelets 311; Potassium 4.0; Sodium 137   Lipid Panel No results found for: CHOL, TRIG, HDL, CHOLHDL, VLDL, LDLCALC, LDLDIRECT  Additional studies/ records that were reviewed today include:   Echocardiogram: 01/20/2019  1. The left ventricle has normal systolic function, with an ejection fraction of 60-65%. The cavity size was normal. There is mildly increased left ventricular wall thickness. No evidence of left ventricular regional wall motion abnormalities.  2. The right ventricle has normal systolc function. The cavity was normal. There is no increase in right ventricular wall thickness.  3. Left atrial size was mildly  dilated.  4. Trivial pericardial effusion is present.  5. No evidence of mitral valve stenosis. Trivial mitral regurgitation.  6. The aortic valve is tricuspid Mild calcification of the aortic valve. No stenosis of the aortic valve.  7. The aortic root is normal in size and structure.  8. The IVC was normal in size. No complete TR doppler jet so unable to estimate PA systolic pressure.    ASSESSMENT & PLAN:    1. Type B aortic dissection s/p TEVAR -Followed by vascular.  Has repeat scan in few weeks.  2. HTN -Excellently  controlled on current medication.  No change  3. Tobacco abuse - congratulated on cessation   4. R lower rib cage pain - Advised to discuss with PCP  Medication Adjustments/Labs and Tests Ordered: Current medicines are reviewed at length with the patient today.  Concerns regarding medicines are outlined above.  Medication changes, Labs and Tests ordered today are listed in the Patient Instructions below. Patient Instructions  Medication Instructions:  Your physician recommends that you continue on your current medications as directed. Please refer to the Current Medication list given to you today.  If you need a refill on your cardiac medications before your next appointment, please call your pharmacy.   Lab work: None ordered  If you have labs (blood work) drawn today and your tests are completely normal, you will receive your results only by: Marland Kitchen. MyChart Message (if you have MyChart) OR . A paper copy in the mail If you have any lab test that is abnormal or we need to change your treatment, we will call you to review the results.  Testing/Procedures: None ordered  Follow-Up: At [facility], you and your health needs are our priority.  As part of our continuing mission to provide you with exceptional heart care, we have created designated Provider Care Teams.  These Care Teams include your primary Cardiologist (physician) and Advanced Practice Providers  (APPs -  Physician Assistants and Nurse Practitioners) who all work together to provide you with the care you need, when you need it. You will need a follow up appointment in:  6 months.  Please call our office 2 months in advance to schedule this appointment.  You may see Tonny BollmanMichael Cooper, MD or one of the following Advanced Practice Providers on your designated Care Team: Tereso NewcomerScott Weaver, PA-C Vin WaukeganBhagat, [place] JerseyPA-C . Berton BonJanine Hammond, NP  Any Other Special Instructions Will Be Listed Below (If Applicable).       Lorelei PontSigned, Berdella Bacot, [place]  03/17/2019 8:15 AM    [facility] [address], [place], [place]  [address] Phone: [phone]; Fax: [phone]

## 2019-03-16 NOTE — Telephone Encounter (Signed)

## 2019-03-17 ENCOUNTER — Other Ambulatory Visit: Payer: Self-pay

## 2019-03-17 ENCOUNTER — Ambulatory Visit: Payer: 59 | Admitting: Physician Assistant

## 2019-03-17 ENCOUNTER — Encounter: Payer: Self-pay | Admitting: Physician Assistant

## 2019-03-17 VITALS — BP 118/62 | HR 80 | Ht 66.0 in | Wt 144.8 lb

## 2019-03-17 DIAGNOSIS — Z9889 Other specified postprocedural states: Secondary | ICD-10-CM | POA: Diagnosis not present

## 2019-03-17 DIAGNOSIS — Z7189 Other specified counseling: Secondary | ICD-10-CM

## 2019-03-17 DIAGNOSIS — I10 Essential (primary) hypertension: Secondary | ICD-10-CM | POA: Diagnosis not present

## 2019-03-17 NOTE — Patient Instructions (Addendum)
Medication Instructions:  Your physician recommends that you continue on your current medications as directed. Please refer to the Current Medication list given to you today.  If you need a refill on your cardiac medications before your next appointment, please call your pharmacy.   Lab work: None ordered  If you have labs (blood work) drawn today and your tests are completely normal, you will receive your results only by: . MyChart Message (if you have MyChart) OR . A paper copy in the mail If you have any lab test that is abnormal or we need to change your treatment, we will call you to review the results.  Testing/Procedures: None ordered  Follow-Up: At [facility], you and your health needs are our priority.  As part of our continuing mission to provide you with exceptional heart care, we have created designated Provider Care Teams.  These Care Teams include your primary Cardiologist (physician) and Advanced Practice Providers (APPs -  Physician Assistants and Nurse Practitioners) who all work together to provide you with the care you need, when you need it. You will need a follow up appointment in:  6 months.  Please call our office 2 months in advance to schedule this appointment.  You may see Michael Cooper, MD or one of the following Advanced Practice Providers on your designated Care Team: Scott Weaver, PA-C Vin Bhagat, PA-C . Janine Hammond, NP  Any Other Special Instructions Will Be Listed Below (If Applicable).    

## 2019-03-26 ENCOUNTER — Other Ambulatory Visit: Payer: Self-pay | Admitting: Cardiovascular Disease

## 2019-05-05 ENCOUNTER — Other Ambulatory Visit: Payer: Self-pay | Admitting: Vascular Surgery

## 2019-05-05 DIAGNOSIS — I711 Thoracic aortic aneurysm, ruptured, unspecified: Secondary | ICD-10-CM

## 2019-05-05 DIAGNOSIS — I7101 Dissection of thoracic aorta: Secondary | ICD-10-CM

## 2019-05-05 DIAGNOSIS — I71019 Dissection of thoracic aorta, unspecified: Secondary | ICD-10-CM

## 2019-05-31 ENCOUNTER — Ambulatory Visit
Admission: RE | Admit: 2019-05-31 | Discharge: 2019-05-31 | Disposition: A | Discharge status: Home / Self Care | Payer: 59 | Source: Ambulatory Visit | Attending: Vascular Surgery | Admitting: Vascular Surgery

## 2019-05-31 DIAGNOSIS — I711 Thoracic aortic aneurysm, ruptured, unspecified: Secondary | ICD-10-CM

## 2019-05-31 DIAGNOSIS — I7101 Dissection of thoracic aorta: Secondary | ICD-10-CM

## 2019-05-31 DIAGNOSIS — I71019 Dissection of thoracic aorta, unspecified: Secondary | ICD-10-CM

## 2019-05-31 MED ORDER — IOPAMIDOL (ISOVUE-370) INJECTION 76%
75.0000 mL | Freq: Once | INTRAVENOUS | Status: AC | PRN
  Administered 2019-05-31: 75 mL via INTRAVENOUS

## 2019-06-03 ENCOUNTER — Other Ambulatory Visit: Payer: Self-pay

## 2019-06-03 ENCOUNTER — Encounter: Payer: Self-pay | Admitting: Vascular Surgery

## 2019-06-03 ENCOUNTER — Ambulatory Visit (INDEPENDENT_AMBULATORY_CARE_PROVIDER_SITE_OTHER): Payer: 59 | Admitting: Vascular Surgery

## 2019-06-03 VITALS — BP 100/61 | HR 74 | Temp 97.6°F | Resp 20 | Ht 66.0 in | Wt 139.7 lb

## 2019-06-03 DIAGNOSIS — I71019 Dissection of thoracic aorta, unspecified: Secondary | ICD-10-CM

## 2019-06-03 DIAGNOSIS — I7101 Dissection of thoracic aorta: Secondary | ICD-10-CM

## 2019-06-03 NOTE — Progress Notes (Signed)
Patient is a 58 year old female who returns for follow-up today.  She underwent repair of a type B aortic dissection with a Gore tag graft June 2020.  She still reports some overall total body weakness and deconditioning.  She says after walking for 10 to 15 minutes both of her legs get weak.  She then recovers quickly and is able to go again.  She does not complain of abdominal pain.  She states that her weakness usually starts in her upper chest and proceeds all the way down her body.  She does not really describe claudication.  She also has some burning and stinging in her anterior thighs.  She states her blood pressure is been well controlled with systolics usually in the 1 teens.  She has quit smoking.  Review of systems: She has no shortness of breath at rest.  She has no chest pain.  Current Outpatient Medications on File Prior to Visit  Medication Sig Dispense Refill  . acetaminophen (TYLENOL) 500 MG tablet Take 1,000 mg by mouth every 6 (six) hours as needed for mild pain.    Marland Kitchen amLODipine (NORVASC) 10 MG tablet TAKE 1 TABLET BY MOUTH EVERY DAY 90 tablet 1  . aspirin EC 81 MG EC tablet Take 1 tablet (81 mg total) by mouth daily.    . carvedilol (COREG) 25 MG tablet TAKE 1 TABLET (25 MG TOTAL) BY MOUTH 2 (TWO) TIMES DAILY WITH A MEAL. 180 tablet 1  . hydrALAZINE (APRESOLINE) 25 MG tablet TAKE 1 TABLET (25 MG TOTAL) BY MOUTH EVERY 8 (EIGHT) HOURS. 90 tablet 3   No current facility-administered medications on file prior to visit.     Past Medical History:  Diagnosis Date  . Ectopic pregnancy   . Thoracic aortic aneurysm ([place]) 02/2019  . Tubal pregnancy     Physical exam:  Vitals:   06/03/19 0831  BP: 100/61  Pulse: 74  Resp: 20  Temp: 97.6 F (36.4 C)  SpO2: 98%  Weight: 139 lb 11.2 oz (63.4 kg)  Height: 5\' 6"  (1.676 m)    Abdomen: Soft nontender nondistended no pulsatile mass   Extremities: 2+ femoral dorsalis pedis pulses bilaterally  Neuro: Symmetric upper extremity  lower extremity motor strength 5/5  Data: CT Angio of the chest abdomen and pelvis was reviewed.  The images were reviewed from May 31, 2019.  This shows no evidence of endoleak.  There is a good seal at the top end of the dissection.  The true lumen provides perfusion for all of the visceral and renal vessels.  The dissection extends all the way down to the right common iliac bifurcation.  The false channel does fill all the way up to the level of the diaphragm.  Maximum aortic diameter is 3.4 cm at the level of the diaphragm.  Assessment: Patient with type B aortic dissection well sealed from thoracic stent graft.  Overall still some weakness and deconditioning.  Plan: Patient will continue to try to increase her activity level over time.  I reassured her today that the dissection is repaired and she should not have further complications from this.  I did discuss with her continued management of her blood pressure to prevent aortic expansion over time.  She will follow-up in 1 year with a repeat CT Angio of the chest abdomen pelvis.  Ruta Hinds, MD [facility] Office: [phone] Pager: [phone]

## 2019-07-18 ENCOUNTER — Other Ambulatory Visit: Payer: Self-pay | Admitting: Cardiovascular Disease

## 2019-07-19 ENCOUNTER — Other Ambulatory Visit: Payer: Self-pay | Admitting: Cardiovascular Disease

## 2019-07-19 MED ORDER — HYDRALAZINE HCL 25 MG PO TABS: 25.0000 mg | ORAL_TABLET | Freq: Three times a day (TID) | ORAL | 2 refills | Status: DC

## 2019-07-19 MED ORDER — AMLODIPINE BESYLATE 10 MG PO TABS: 10.0000 mg | ORAL_TABLET | Freq: Every day | ORAL | 2 refills | Status: DC

## 2019-07-19 MED ORDER — CARVEDILOL 25 MG PO TABS: 25.0000 mg | ORAL_TABLET | Freq: Two times a day (BID) | ORAL | 2 refills | Status: DC

## 2019-07-19 NOTE — Telephone Encounter (Signed)
Pt's medications were sent to pt's pharmacy as requested. Confirmation received.  

## 2019-09-27 NOTE — Progress Notes (Signed)
Cardiology Office Note    Date:  09/30/2019   ID:  Heather Pollard, DOB 09-29-1960, MRN [id number]  PCP:  Patient, No Pcp Per  Cardiologist:  Dr. Excell Seltzer   Chief Complaint: 6 Months follow up  History of Present Illness:   Heather Pollard is a 59 y.o. female with hx of  type B aortic dissection s/p TEVAR, HTN and prior tobacco abuse seen for follow up.   Admitted to [facility] 01/2019 for acute Type B aortic dissection and hypertensive urgency. The patient was treated conservatively and discharged. However presented 02/17/2019 with recurrent tearing sensation in her chest and substernal area. Work up reveled 5 mm increase in size over 1 month and under went TEVAR. No complications.  She was doing well on cardiac stand point when last seen by me 03/2019. Quit smoking after surgery.   Here today for follow up.  Patient reports she is feeling "buzzing feeling" since her surgery.  She has noted systolic blood pressure of 90-100 at home all the time.  Her blood pressure never above 110 systolically.  She feels dizziness upon standing from laying or sitting position.  She is unable to exercise due to constantly feeling weak and dizzy.  Patient reports severe trauma after falling off 14 feet height few years ago.  Since then she has severe joint and back pain.  She does not follow-up with PCP.  Past Medical History:  Diagnosis Date  . Ectopic pregnancy   . Thoracic aortic aneurysm (HCC) 02/2019  . Tubal pregnancy     Past Surgical History:  Procedure Laterality Date  . BREAST SURGERY    . ECTOPIC PREGNANCY SURGERY    . PARTIAL HYSTERECTOMY    . THORACIC AORTIC ENDOVASCULAR STENT GRAFT N/A 02/22/2019   Procedure: THORACIC AORTIC ENDOVASCULAR STENT GRAFT;  Surgeon: Sherren Kerns, MD;  Location: [facility] OR;  Service: Vascular;  Laterality: N/A;  . ULTRASOUND GUIDANCE FOR VASCULAR ACCESS Right 02/22/2019   Procedure: Ultrasound Guidance For Vascular Access;  Surgeon: Sherren Kerns, MD;  Location:  [facility] OR;  Service: Vascular;  Laterality: Right;    Current Medications: Prior to Admission medications   Medication Sig Start Date End Date Taking? Authorizing Provider  acetaminophen (TYLENOL) 500 MG tablet Take 1,000 mg by mouth every 6 (six) hours as needed for mild pain.    [provider]  amLODipine (NORVASC) 10 MG tablet Take 1 tablet (10 mg total) by mouth daily. 07/19/19   Tonny Bollman, MD  aspirin EC 81 MG EC tablet Take 1 tablet (81 mg total) by mouth daily. 01/26/19   Emilie Rutter, PA-C  carvedilol (COREG) 25 MG tablet Take 1 tablet (25 mg total) by mouth 2 (two) times daily with a meal. 07/19/19   Tonny Bollman, MD  hydrALAZINE (APRESOLINE) 25 MG tablet Take 1 tablet (25 mg total) by mouth every 8 (eight) hours. 07/19/19   Tonny Bollman, MD    Allergies:   Penicillins   Social History   Socioeconomic History  . Marital status: Married    Spouse name: Not on file  . Number of children: Not on file  . Years of education: Not on file  . Highest education level: Not on file  Occupational History  . Not on file  Tobacco Use  . Smoking status: Former Smoker    Types: Cigarettes    Quit date: 01/25/2019    Years since quitting: 0.6  . Smokeless tobacco: Never Used  Substance and Sexual Activity  . Alcohol  use: No  . Drug use: Never  . Sexual activity: Not on file  Other Topics Concern  . Not on file  Social History Narrative  . Not on file   Social Determinants of Health   Financial Resource Strain:   . Difficulty of Paying Living Expenses: Not on file  Food Insecurity:   . Worried About Charity fundraiser in the Last Year: Not on file  . Ran Out of Food in the Last Year: Not on file  Transportation Needs:   . Lack of Transportation (Medical): Not on file  . Lack of Transportation (Non-Medical): Not on file  Physical Activity:   . Days of Exercise per Week: Not on file  . Minutes of Exercise per Session: Not on file  Stress:   . Feeling of  Stress : Not on file  Social Connections:   . Frequency of Communication with Friends and Family: Not on file  . Frequency of Social Gatherings with Friends and Family: Not on file  . Attends Religious Services: Not on file  . Active Member of Clubs or Organizations: Not on file  . Attends Archivist Meetings: Not on file  . Marital Status: Not on file     Family History:  The patient's family history is not on file.   ROS:   Please see the history of present illness.    ROS All other systems reviewed and are negative.   PHYSICAL EXAM:   VS:  BP 100/62   Pulse 65   Ht 5\' 6"  (1.676 m)   Wt 145 lb 12.8 oz (66.1 kg)   SpO2 98%   BMI 23.53 kg/m    GEN: Well nourished, well developed, in no acute distress  HEENT: normal  Neck: no JVD, carotid bruits, or masses Cardiac:RRR; no murmurs, rubs, or gallops,no edema  Respiratory:  clear to auscultation bilaterally, normal work of breathing GI: soft, nontender, nondistended, + BS MS: no deformity or atrophy  Skin: warm and dry, diffuse rash Neuro:  Alert and Oriented x 3, Strength and sensation are intact Psych: euthymic mood, full affect  Wt Readings from Last 3 Encounters:  09/30/19 145 lb 12.8 oz (66.1 kg)  06/03/19 139 lb 11.2 oz (63.4 kg)  03/17/19 144 lb 12.8 oz (65.7 kg)      Studies/Labs Reviewed:   EKG:  EKG is not ordered today.   Recent Labs: 01/18/2019: B Natriuretic Peptide 71.7 01/19/2019: Magnesium 1.9 02/17/2019: ALT 22 02/23/2019: BUN 13; Creatinine, Ser 1.17; Hemoglobin 10.5; Platelets 311; Potassium 4.0; Sodium 137   Lipid Panel No results found for: CHOL, TRIG, HDL, CHOLHDL, VLDL, LDLCALC, LDLDIRECT  Additional studies/ records that were reviewed today include:  Echocardiogram: 01/20/2019 1. The left ventricle has normal systolic function, with an ejection fraction of 60-65%. The cavity size was normal. There is mildly increased left ventricular wall thickness. No evidence of left ventricular  regional wall motion abnormalities. 2. The right ventricle has normal systolc function. The cavity was normal. There is no increase in right ventricular wall thickness. 3. Left atrial size was mildly dilated. 4. Trivial pericardial effusion is present. 5. No evidence of mitral valve stenosis. Trivial mitral regurgitation. 6. The aortic valve is tricuspid Mild calcification of the aortic valve. No stenosis of the aortic valve. 7. The aortic root is normal in size and structure. 8. The IVC was normal in size. No complete TR doppler jet so unable to estimate PA systolic pressure.  ASSESSMENT & PLAN:  1. Type B aortic dissection s/p TEVAR -Followed by vascular.  Has repeat scan in few weeks.  2. HTN -Her blood pressure runs soft low and she is symptomatic with this. She is not orthostatic by vital however felt severely dizzy upon standing.  Will stop her hydralazine.  Continue Coreg and Norvasc at current dose.  3. Tobacco abuse - congratulated on cessation   4.  Rash -Patient has diffuse rash since starting her antihypertensive 6 months ago.  No itching. -Stop hydralazine as above  5. "  Buzzing sensation" -Could be from severe orthopedic/neuro issue given history of fall.  I have encouraged her to establish care with PCP for further evaluation or go see orthopedics.    Medication Adjustments/Labs and Tests Ordered: Current medicines are reviewed at length with the patient today.  Concerns regarding medicines are outlined above.  Medication changes, Labs and Tests ordered today are listed in the Patient Instructions below. Patient Instructions  Medication Instructions:  Your physician has recommended you make the following change in your medication:  STOP: HYDRALAZINE 25 MG  *If you need a refill on your cardiac medications before your next appointment, please call your pharmacy*  Lab Work: NONE If you have labs (blood work) drawn today and your tests are completely  normal, you will receive your results only by: Marland Kitchen MyChart Message (if you have MyChart) OR . A paper copy in the mail If you have any lab test that is abnormal or we need to change your treatment, we will call you to review the results.  Testing/Procedures: NONE  Follow-Up: At [facility], you and your health needs are our priority.  As part of our continuing mission to provide you with exceptional heart care, we have created designated Provider Care Teams.  These Care Teams include your primary Cardiologist (physician) and Advanced Practice Providers (APPs -  Physician Assistants and Nurse Practitioners) who all work together to provide you with the care you need, when you need it.  Your next appointment:   6 week(s)  The format for your next appointment:   Virtual Visit   Provider:   Chelsea Aus, PA-C  Other Instructions      Signed, Manson Passey, [place]  09/30/2019 10:15 AM    [facility] [address] [place], [place], [place]  [address] Phone: [phone]; Fax: [phone]

## 2019-09-30 ENCOUNTER — Ambulatory Visit: Payer: Managed Care, Other (non HMO) | Admitting: Physician Assistant

## 2019-09-30 ENCOUNTER — Other Ambulatory Visit: Payer: Self-pay

## 2019-09-30 ENCOUNTER — Encounter: Payer: Self-pay | Admitting: Physician Assistant

## 2019-09-30 VITALS — BP 100/62 | HR 65 | Ht 66.0 in | Wt 145.8 lb

## 2019-09-30 DIAGNOSIS — R21 Rash and other nonspecific skin eruption: Secondary | ICD-10-CM | POA: Diagnosis not present

## 2019-09-30 DIAGNOSIS — Z9889 Other specified postprocedural states: Secondary | ICD-10-CM | POA: Diagnosis not present

## 2019-09-30 DIAGNOSIS — I10 Essential (primary) hypertension: Secondary | ICD-10-CM

## 2019-09-30 DIAGNOSIS — R449 Unspecified symptoms and signs involving general sensations and perceptions: Secondary | ICD-10-CM | POA: Diagnosis not present

## 2019-09-30 NOTE — Patient Instructions (Signed)
Medication Instructions:  Your physician has recommended you make the following change in your medication:  STOP: HYDRALAZINE 25 MG  *If you need a refill on your cardiac medications before your next appointment, please call your pharmacy*  Lab Work: NONE If you have labs (blood work) drawn today and your tests are completely normal, you will receive your results only by: Marland Kitchen MyChart Message (if you have MyChart) OR . A paper copy in the mail If you have any lab test that is abnormal or we need to change your treatment, we will call you to review the results.  Testing/Procedures: NONE  Follow-Up: At [facility], you and your health needs are our priority.  As part of our continuing mission to provide you with exceptional heart care, we have created designated Provider Care Teams.  These Care Teams include your primary Cardiologist (physician) and Advanced Practice Providers (APPs -  Physician Assistants and Nurse Practitioners) who all work together to provide you with the care you need, when you need it.  Your next appointment:   6 week(s)  The format for your next appointment:   Virtual Visit   Provider:   Chelsea Aus, PA-C  Other Instructions

## 2019-11-14 NOTE — Progress Notes (Signed)
Virtual Visit via Telephone Note   This visit type was conducted due to national recommendations for restrictions regarding the COVID-19 Pandemic (e.g. social distancing) in an effort to limit this patient's exposure and mitigate transmission in our community.  Due to her co-morbid illnesses, this patient is at least at moderate risk for complications without adequate follow up.  This format is felt to be most appropriate for this patient at this time.  The patient did not have access to video technology/had technical difficulties with video requiring transitioning to audio format only (telephone).  All issues noted in this document were discussed and addressed.  No physical exam could be performed with this format.  Please refer to the patient's chart for her  consent to telehealth for [facility].   Date:  11/15/2019   ID:  Heather Pollard, DOB 11-27-1960, MRN [id number]  Patient Location: Home Provider Location: Office  PCP:  Patient, No Pcp Per  Cardiologist:  Tonny Bollman, MD  Evaluation Performed:  Follow-Up Visit  Chief Complaint:  6 weeks follow up  History of Present Illness:    Heather Pollard is a 59 y.o. female with hx of type B aortic dissection s/p TEVAR, HTN and prior tobacco abuse (quit after surgery) seen for follow up.   Admitted to [facility] 01/2019 for acuteType B aortic dissection and hypertensive urgency. The patient was treated conservatively and discharged. However presented 02/17/2019 withrecurrent tearing sensation in her chest and substernal area. Work up reveled5 mm increase in size over 1 monthand under wentTEVAR. No complications.  Seen by me 09/30/19 for followed up.  Soft blood pressure and diffuse rest.  Stopped hydralazine.  Continue Coreg and Norvasc.  Recommended establish care with PCP for "buzzing sensation">> severe orthopedic/neuro issue given history of fall.  Seen for follow-up.  After discontinuation of hydralazine 25 mg 3 times daily her  rash was improved however noted feet, knee and wrist swelling.  She restarted hydralazine at 25 mg daily.  Since then her swelling has gone but has mild rest.  She is feeling more energetic since this adjustment.  Blood pressure stays in 110s.  We agreed to keep on current dose and she will establish care with primary care provider to rule out orthotics issue.  The patient does not have symptoms concerning for COVID-19 infection (fever, chills, cough, or new shortness of breath).    Past Medical History:  Diagnosis Date  . Ectopic pregnancy   . Thoracic aortic aneurysm (HCC) 02/2019  . Tubal pregnancy    Past Surgical History:  Procedure Laterality Date  . BREAST SURGERY    . ECTOPIC PREGNANCY SURGERY    . PARTIAL HYSTERECTOMY    . THORACIC AORTIC ENDOVASCULAR STENT GRAFT N/A 02/22/2019   Procedure: THORACIC AORTIC ENDOVASCULAR STENT GRAFT;  Surgeon: Sherren Kerns, MD;  Location: [facility] OR;  Service: Vascular;  Laterality: N/A;  . ULTRASOUND GUIDANCE FOR VASCULAR ACCESS Right 02/22/2019   Procedure: Ultrasound Guidance For Vascular Access;  Surgeon: Sherren Kerns, MD;  Location: [facility] OR;  Service: Vascular;  Laterality: Right;     Current Meds  Medication Sig  . acetaminophen (TYLENOL) 500 MG tablet Take 1,000 mg by mouth every 6 (six) hours as needed for mild pain.  Marland Kitchen amLODipine (NORVASC) 10 MG tablet Take 1 tablet (10 mg total) by mouth daily.  Marland Kitchen aspirin EC 81 MG EC tablet Take 1 tablet (81 mg total) by mouth daily.  . carvedilol (COREG) 25 MG tablet Take 1 tablet (25 mg  total) by mouth 2 (two) times daily with a meal.  . hydrALAZINE (APRESOLINE) 25 MG tablet Take 25 mg by mouth daily.      Allergies:   Penicillins   Social History   Tobacco Use  . Smoking status: Former Smoker    Types: Cigarettes    Quit date: 01/25/2019    Years since quitting: 0.8  . Smokeless tobacco: Never Used  Substance Use Topics  . Alcohol use: No  . Drug use: Never     Family Hx: The patient's  family history is not on file.  ROS:   Please see the history of present illness.    All other systems reviewed and are negative.   Prior CV studies:   The following studies were reviewed today:  Echocardiogram: 01/20/2019 1. The left ventricle has normal systolic function, with an ejection fraction of 60-65%. The cavity size was normal. There is mildly increased left ventricular wall thickness. No evidence of left ventricular regional wall motion abnormalities. 2. The right ventricle has normal systolc function. The cavity was normal. There is no increase in right ventricular wall thickness. 3. Left atrial size was mildly dilated. 4. Trivial pericardial effusion is present. 5. No evidence of mitral valve stenosis. Trivial mitral regurgitation. 6. The aortic valve is tricuspid Mild calcification of the aortic valve. No stenosis of the aortic valve. 7. The aortic root is normal in size and structure. 8. The IVC was normal in size. No complete TR doppler jet so unable to estimate PA systolic pressure  Labs/Other Tests and Data Reviewed:    EKG:  No ECG reviewed.  Recent Labs: 01/18/2019: B Natriuretic Peptide 71.7 01/19/2019: Magnesium 1.9 02/17/2019: ALT 22 02/23/2019: BUN 13; Creatinine, Ser 1.17; Hemoglobin 10.5; Platelets 311; Potassium 4.0; Sodium 137   Recent Lipid Panel No results found for: CHOL, TRIG, HDL, CHOLHDL, LDLCALC, LDLDIRECT  Wt Readings from Last 3 Encounters:  11/15/19 140 lb (63.5 kg)  09/30/19 145 lb 12.8 oz (66.1 kg)  06/03/19 139 lb 11.2 oz (63.4 kg)     Objective:    Vital Signs:  BP 100/63   Pulse 71   Ht 5\' 6"  (1.676 m)   Wt 140 lb (63.5 kg)   BMI 22.60 kg/m    VITAL SIGNS:  reviewed GEN:  no acute distress PSYCH:  normal affect  ASSESSMENT & PLAN:    1.Type B aortic dissections/p TEVAR -Followed by vascular.   2.HTN -BP stable.  She noted swelling on knee, ankle and wrist while off hydralazine which has been improved on once a day  hydralazine dose.  Her energy has improved.  Will continue amlodipine 10 mg daily, hydralazine 25 mg daily and Coreg 25 mg twice daily.  If blood pressure goes low she me try reducing amlodipine 5 mg daily and give a call.  She will establish care with PCP to rule out arthritis issue.  3. Rash -improved   COVID-19 Education: The signs and symptoms of COVID-19 were discussed with the patient and how to seek care for testing (follow up with PCP or arrange E-visit).  The importance of social distancing was discussed today.  Time:   Today, I have spent 8 minutes with the patient with telehealth technology discussing the above problems.     Medication Adjustments/Labs and Tests Ordered: Current medicines are reviewed at length with the patient today.  Concerns regarding medicines are outlined above.   Tests Ordered: No orders of the defined types were placed in this encounter.  Medication Changes: No orders of the defined types were placed in this encounter.   Follow Up:  Either In Person or Virtual in 8 month(s)  Signed, Leanor Kail, [place]  11/15/2019 10:09 AM    [place]

## 2019-11-15 ENCOUNTER — Telehealth (INDEPENDENT_AMBULATORY_CARE_PROVIDER_SITE_OTHER): Payer: Self-pay | Admitting: Physician Assistant

## 2019-11-15 ENCOUNTER — Telehealth: Payer: Self-pay | Admitting: *Deleted

## 2019-11-15 ENCOUNTER — Encounter: Payer: Self-pay | Admitting: Physician Assistant

## 2019-11-15 ENCOUNTER — Other Ambulatory Visit: Payer: Self-pay

## 2019-11-15 VITALS — BP 100/63 | HR 71 | Ht 66.0 in | Wt 140.0 lb

## 2019-11-15 DIAGNOSIS — R21 Rash and other nonspecific skin eruption: Secondary | ICD-10-CM

## 2019-11-15 DIAGNOSIS — Z9889 Other specified postprocedural states: Secondary | ICD-10-CM

## 2019-11-15 DIAGNOSIS — I10 Essential (primary) hypertension: Secondary | ICD-10-CM

## 2019-11-15 NOTE — Patient Instructions (Signed)
Medication Instructions:  Your physician recommends that you continue on your current medications as directed. Please refer to the Current Medication list given to you today.  *If you need a refill on your cardiac medications before your next appointment, please call your pharmacy*   Lab Work: None ordered  If you have labs (blood work) drawn today and your tests are completely normal, you will receive your results only by: Marland Kitchen MyChart Message (if you have MyChart) OR . A paper copy in the mail If you have any lab test that is abnormal or we need to change your treatment, we will call you to review the results.   Testing/Procedures: None ordered   Follow-Up: At [facility], you and your health needs are our priority.  As part of our continuing mission to provide you with exceptional heart care, we have created designated Provider Care Teams.  These Care Teams include your primary Cardiologist (physician) and Advanced Practice Providers (APPs -  Physician Assistants and Nurse Practitioners) who all work together to provide you with the care you need, when you need it.  We recommend signing up for the patient portal called "MyChart".  Sign up information is provided on this After Visit Summary.  MyChart is used to connect with patients for Virtual Visits (Telemedicine).  Patients are able to view lab/test results, encounter notes, upcoming appointments, etc.  Non-urgent messages can be sent to your provider as well.   To learn more about what you can do with MyChart, go to [email or web address].    Your next appointment:   6 month(s)  The format for your next appointment:   In Person  Provider:   You may see Tonny Bollman, MD or one of the following Advanced Practice Providers on your designated Care Team:    Tereso Newcomer, PA-C  Vin [place], [place]  [place], [place]

## 2019-11-15 NOTE — Telephone Encounter (Signed)
   Your [facility] team (Cardiologist [Heart Doctor] and Advanced Practice Provider [Physician Assistant; Nurse Practitioner]) has arranged for your next office appointment to be a virtual visit (also known as "Telehealth", "Telemedicine", "E-Visit").   We now offer virtual visits for all our patients.  This helps us to expand our ability to see patients in a timely and safe manner.  These visits are billed to your insurance just like traditional, in person, appointments.  Please review this IMPORTANT information about your upcoming appointment.   **PLEASE READ THE SECTION BELOW LABELED "CONSENT".**   **CALL OUR OFFICE WITH QUESTIONS.**   WHAT YOU NEED FOR YOUR VIRTUAL VISIT:  You will need a SmartPhone with microphone and video capability.  [If you are using MyChart to connect to your visit, it is also possible to use a desktop/laptop computer (with an Internet connection), as long as you have microphone and video capability.]  You will need to use Chrome, Edge or Safari as your default web browser.  We highly recommend that you have a MyChart account as this will make connecting to your visit seamless.  A MyChart account not only allows you to connect to your provider for a virtual visit, but also allows you to see the results of all your tests, provider notes, medications and upcoming appointments.    A MyChart account is not absolutely necessary.  We can still complete your visit if you do not have one.    If you do not have a computer or SmartPhone with video/microphone capability or your Internet/cell service is weak on the day of your visit, we will do your visit by telephone.    A blood pressure cuff and scale are essential to collect your vital signs at home.  If you do not have these and you are unable to obtain them, please contact our office so that we can make arrangements for you.  If you have a pulse oximeter, Apple watch, Kardia mobile device, etc, you can collect data  from these devices as well to share with the provider for your visit.  These devices are not required for a virtual visit.    WHAT TO DO ON THE DAY OF YOUR APPOINTMENT: 30 minutes before your appointment:  Take your blood pressure, pulse or heart rate (if your blood pressure machine is able to collect it) and weight.  Write all these numbers down so you can give it to the nurse or medical assistant that calls.  Get all of the medications you currently take and put them where you will be sitting for the appointment.  The nurse/medical assistant will go over these with you when he/she calls.  15 minutes before your appointment:  You will receive a phone call from a nurse or medical assistant from our office.  The caller ID on your phone may indicate that the caller is either "[facility]" or "[facility]".  However, the number may come across as spam.  Please turn off any spam blocker so you do not miss the call.  The nurse will:  Ask for your blood pressure, pulse, weight, height.  Go over all of your medications to make sure your chart is correct.  Review your allergies, smoking history, reason for appointment, etc.   Give you instructions on how to connect with the video platform or telephone.  IF YOUR VISIT IS BY TELEPHONE ONLY: After the nurse finishes getting you ready for the visit, your provider will call you on the phone   number you provide to us.   TO CONNECT WITH YOUR PROVIDER FOR YOUR APPOINTMENT (BY VIDEO): You will either connect with the provider with your MyChart account or (if you are not using MyChart) with a link sent to your SmartPhone by text message.  If you are using MyChart, see below.  If you are not using MyChart, the nurse will send you the text message during the phone call.     If you are connecting with your MyChart account:   (The nurse that calls you will tell you when to do this):  You will log into your account. At the top of your home screen, you  should see the following prompt that tells you to "Begin your video visit with . . . ".  Click the green button (BEGIN VISIT).    The next screen will say "It's time to start your video visit!" Click the green button (BEGIN VIDEO VISIT)    There may be a screen that appears that asks for permission to use your camera and/or microphone.  Click ALLOW.  This will open the browser where your appointment will take place.   You may see the message: "Welcome.  Waiting for the call to begin."  Or, you will see that the nurse or your provider are already "in" the room waiting on you.   If you are connecting with a link sent to your SmartPhone via text: The nurse that calls you will send the link by text message. Click on the link (it should look something like this):     If asked to give permission to use the camera and or microphone, click ALLOW This will open the browser where your appointment will take place.      You may see the message: "Welcome.  Waiting for the call to begin."  Or, you will see that the nurse or your provider are already "in" the room waiting on you.    The controls for your visit look like the picture below.  Please note that this is what the microphone and camera look like when they are ON.  If muted, they will have a line through them.    After the appointment: Once your provider leaves the appointment, he/she will go over instructions with the nurse/medical assistant.  If needed, this person will call you with any instructions, appointments, etc.   A copy of your After Visit Summary (AVS) will be available later that day in your MyChart account.  This document will have all of your instructions, medications, appointments, etc.  If you are not using MyChart, we will mail it to your home.     *CONSENT FOR TELE-HEALTH VISIT - PLEASE REVIEW* By participating in the scheduled virtual visit (and any virtual visit scheduled within 365 days of the printing of this  document), I agree to the following:   I hereby voluntarily request, consent and authorize [facility] and its employed or contracted physicians, physician assistants, nurse practitioners or other licensed health care professionals (the Practitioner), to provide me with telemedicine health care services (the "Services") as deemed necessary by the treating Practitioner. I acknowledge and consent to receive the Services by the Practitioner via telemedicine. I understand that the telemedicine visit will involve communicating with the Practitioner through live audiovisual communication technology and the disclosure of certain medical information by electronic transmission. I acknowledge that I have been given the opportunity to request an in-person assessment or other available alternative prior to the telemedicine visit   and am voluntarily participating in the telemedicine visit.  I understand that I have the right to withhold or withdraw my consent to the use of telemedicine in the course of my care at any time, without affecting my right to future care or treatment, and that the Practitioner or I may terminate the telemedicine visit at any time. I understand that I have the right to inspect all information obtained and/or recorded in the course of the telemedicine visit and may receive copies of available information for a reasonable fee.  I understand that some of the potential risks of receiving the Services via telemedicine include:  . Delay or interruption in medical evaluation due to technological equipment failure or disruption; . Information transmitted may not be sufficient (e.g. poor resolution of images) to allow for appropriate medical decision making by the Practitioner; and/or  . In rare instances, security protocols could fail, causing a breach of personal health information.  Furthermore, I acknowledge that it is my responsibility to provide information about my medical history, conditions  and care that is complete and accurate to the best of my ability. I acknowledge that Practitioner's advice, recommendations, and/or decision may be based on factors not within their control, such as incomplete or inaccurate data provided by me or distortions of diagnostic images or specimens that may result from electronic transmissions. I understand that the practice of medicine is not an exact science and that Practitioner makes no warranties or guarantees regarding treatment outcomes. I acknowledge that a copy of this consent can be made available to me via my patient portal ([place] MyChart), or I can request a printed copy by calling the office of [facility].    I understand that my insurance will be billed for this visit.   I have read or had this consent read to me. . I understand the contents of this consent, which adequately explains the benefits and risks of the Services being provided via telemedicine.  . I have been provided ample opportunity to ask questions regarding this consent and the Services and have had my questions answered to my satisfaction. . I give my informed consent for the services to be provided through the use of telemedicine in my medical care   

## 2020-04-19 ENCOUNTER — Telehealth: Payer: Self-pay | Admitting: Cardiovascular Disease

## 2020-04-19 ENCOUNTER — Other Ambulatory Visit: Payer: Self-pay | Admitting: Cardiovascular Disease

## 2020-04-19 MED ORDER — CARVEDILOL 25 MG PO TABS: ORAL_TABLET | ORAL | 7 refills | Status: DC

## 2020-04-19 NOTE — Telephone Encounter (Signed)
  *  STAT* If patient is at the pharmacy, call can be transferred to refill team.   1. Which medications need to be refilled? (please list name of each medication and dose if known)   carvedilol (COREG) 25 MG tablet    2. Which pharmacy/location (including street and city if local pharmacy) is medication to be sent to? [id number] - [place], [place] - [address]  3. Do they need a 30 day or 90 day supply? 30 days  

## 2020-04-19 NOTE — Telephone Encounter (Signed)
Pt's medication was sent to pt's pharmacy as requested. Confirmation received.  °

## 2020-05-05 ENCOUNTER — Other Ambulatory Visit: Payer: Self-pay

## 2020-05-05 DIAGNOSIS — I712 Thoracic aortic aneurysm, without rupture, unspecified: Secondary | ICD-10-CM

## 2020-06-02 NOTE — Progress Notes (Signed)
Virtual Visit via Telephone Note   This visit type was conducted due to national recommendations for restrictions regarding the COVID-19 Pandemic (e.g. social distancing) in an effort to limit this patient's exposure and mitigate transmission in our community.  Due to her co-morbid illnesses, this patient is at least at moderate risk for complications without adequate follow up.  This format is felt to be most appropriate for this patient at this time.  The patient did not have access to video technology/had technical difficulties with video requiring transitioning to audio format only (telephone).  All issues noted in this document were discussed and addressed.  No physical exam could be performed with this format.  Please refer to the patient's chart for her  consent to telehealth for [facility].    Date:  06/06/2020   ID:  Heather Pollard, DOB Jan 03, 1961, MRN [id number] The patient was identified using 2 identifiers.  Patient Location: Home Provider Location: Office/Clinic  PCP:  Patient, No Pcp Per      Cardiologist:  Tonny Bollman, MD Evaluation Performed:  Follow-Up Visit  Chief Complaint:  6 months follow up   History of Present Illness:    Heather Pollard is a 59 y.o. female with hx of type B aortic dissections/p TEVAR, HTN andpriortobacco abuse(quit after surgery)seen for follow up.  Admitted to [facility] 01/2019 for acuteType B aortic dissection and hypertensive urgency. The patient was treated conservatively and discharged. However presented 02/17/2019 withrecurrent tearing sensation in her chest and substernal area. Work up reveled5 mm increase in size over 1 monthand under wentTEVAR. No complications.  Hx of Rash on high does of hydralazine. Last seen 11/15/19. Recommended to established care with PCP to r/p orthopedic issue given feet, knee and wrist swelling.  Seen virtually for follow-up.  She walks on treatment and does swimming.  No chest pain,  shortness of breath, orthopnea, PND, syncope, lower extremity edema or melena.  No recurrent rest.  Reports occasional abdominal pain radiating to her back.  Feels like sharp burning sensation.   The patient does not have symptoms concerning for COVID-19 infection (fever, chills, cough, or new shortness of breath).        Past Medical History:  Diagnosis Date  . Ectopic pregnancy   . Thoracic aortic aneurysm (HCC) 02/2019  . Tubal pregnancy         Past Surgical History:  Procedure Laterality Date  . BREAST SURGERY    . ECTOPIC PREGNANCY SURGERY    . PARTIAL HYSTERECTOMY    . THORACIC AORTIC ENDOVASCULAR STENT GRAFT N/A 02/22/2019   Procedure: THORACIC AORTIC ENDOVASCULAR STENT GRAFT;  Surgeon: Sherren Kerns, MD;  Location: [facility] OR;  Service: Vascular;  Laterality: N/A;  . ULTRASOUND GUIDANCE FOR VASCULAR ACCESS Right 02/22/2019   Procedure: Ultrasound Guidance For Vascular Access;  Surgeon: Sherren Kerns, MD;  Location: [facility] OR;  Service: Vascular;  Laterality: Right;     Active Medications      Current Meds  Medication Sig  . acetaminophen (TYLENOL) 500 MG tablet Take 1,000 mg by mouth every 6 (six) hours as needed for mild pain.  Marland Kitchen amLODipine (NORVASC) 10 MG tablet Take 1 tablet (10 mg total) by mouth daily.  Marland Kitchen aspirin EC 81 MG EC tablet Take 1 tablet (81 mg total) by mouth daily.  . carvedilol (COREG) 25 MG tablet TAKE 1 TABLET BY MOUTH 2 TIMES DAILY WITH A MEAL.  . hydrALAZINE (APRESOLINE) 25 MG tablet Take 25 mg by mouth daily.  Allergies:   Penicillins   Social History        Tobacco Use  . Smoking status: Former Smoker    Types: Cigarettes    Quit date: 01/25/2019    Years since quitting: 1.3  . Smokeless tobacco: Never Used  Vaping Use  . Vaping Use: Never used  Substance Use Topics  . Alcohol use: No  . Drug use: Never     Family Hx: The patient's family history includes Alzheimer's disease in her father; Cancer in her  mother; Thyroid cancer in her sister.  ROS:   Please see the history of present illness.    All other systems reviewed and are negative.   Prior CV studies:   The following studies were reviewed today:  Echocardiogram: 01/20/2019 1. The left ventricle has normal systolic function, with an ejection fraction of 60-65%. The cavity size was normal. There is mildly increased left ventricular wall thickness. No evidence of left ventricular regional wall motion abnormalities. 2. The right ventricle has normal systolc function. The cavity was normal. There is no increase in right ventricular wall thickness. 3. Left atrial size was mildly dilated. 4. Trivial pericardial effusion is present. 5. No evidence of mitral valve stenosis. Trivial mitral regurgitation. 6. The aortic valve is tricuspid Mild calcification of the aortic valve. No stenosis of the aortic valve. 7. The aortic root is normal in size and structure. 8. The IVC was normal in size. No complete TR doppler jet so unable to estimate PA systolic pressure  Labs/Other Tests and Data Reviewed:    EKG:  No ECG reviewed.  Recent Labs: No results found for requested labs within last 8760 hours.   Recent Lipid Panel Labs (Brief)  No results found for: CHOL, TRIG, HDL, CHOLHDL, LDLCALC, LDLDIRECT       Wt Readings from Last 3 Encounters:  06/06/20 140 lb (63.5 kg)  11/15/19 140 lb (63.5 kg)  09/30/19 145 lb 12.8 oz (66.1 kg)     Objective:    Vital Signs:  BP 107/66   Pulse 66   Ht 5\' 6"  (1.676 m)   Wt 140 lb (63.5 kg)   BMI 22.60 kg/m    VITAL SIGNS:  reviewed GEN:  no acute distress PSYCH:  normal affect  ASSESSMENT & PLAN:     1.Type B aortic dissections/p TEVAR -Followed by vascular.  Has appointment in few weeks  2.HTN -Excellently controlled. -No change in therapy.  3.  Abdominal pain -Feels like sharp burning sensation with occasional radiation to her back. -Advised to take OTC  Nexium on a trial basis. -She has upcoming CT angio chest aorta and abdominal pelvis, If reassuring CT, advised to follow up with PCP   COVID-19 Education: The signs and symptoms of COVID-19 were discussed with the patient and how to seek care for testing (follow up with PCP or arrange E-visit).  The importance of social distancing was discussed today.  Time:   Today, I have spent 10 minutes with the patient with telehealth technology discussing the above problems.     Medication Adjustments/Labs and Tests Ordered: Current medicines are reviewed at length with the patient today.  Concerns regarding medicines are outlined above.   Tests Ordered: No orders of the defined types were placed in this encounter.   Medication Changes: No orders of the defined types were placed in this encounter.   Follow Up:  Virtual Visit  in 6 month(s)  Signed, , Manson Passey  06/06/2020 8:09 AM  [facility]

## 2020-06-06 ENCOUNTER — Other Ambulatory Visit: Payer: Self-pay

## 2020-06-06 ENCOUNTER — Telehealth (INDEPENDENT_AMBULATORY_CARE_PROVIDER_SITE_OTHER): Payer: BC Managed Care – PPO | Admitting: Physician Assistant

## 2020-06-06 ENCOUNTER — Encounter: Payer: Self-pay | Admitting: Physician Assistant

## 2020-06-06 VITALS — BP 107/66 | HR 66 | Ht 66.0 in | Wt 140.0 lb

## 2020-06-06 DIAGNOSIS — I10 Essential (primary) hypertension: Secondary | ICD-10-CM | POA: Diagnosis not present

## 2020-06-06 DIAGNOSIS — R1084 Generalized abdominal pain: Secondary | ICD-10-CM

## 2020-06-06 DIAGNOSIS — Z9889 Other specified postprocedural states: Secondary | ICD-10-CM

## 2020-06-06 MED ORDER — HYDRALAZINE HCL 25 MG PO TABS: 25.0000 mg | ORAL_TABLET | Freq: Every day | ORAL | 3 refills | Status: DC

## 2020-06-06 MED ORDER — AMLODIPINE BESYLATE 10 MG PO TABS: 10.0000 mg | ORAL_TABLET | Freq: Every day | ORAL | 3 refills | Status: DC

## 2020-06-06 MED ORDER — CARVEDILOL 25 MG PO TABS: ORAL_TABLET | ORAL | 3 refills | Status: DC

## 2020-06-06 NOTE — Patient Instructions (Addendum)
Medication Instructions:  Your physician recommends that you continue on your current medications as directed. Please refer to the Current Medication list given to you today.  Your medication refills [facility] to [facility].   *If you need a refill on your cardiac medications before your next appointment, please call your pharmacy*   Lab Work: None ordered  If you have labs (blood work) drawn today and your tests are completely normal, you will receive your results only by: Marland Kitchen MyChart Message (if you have MyChart) OR . A paper copy in the mail If you have any lab test that is abnormal or we need to change your treatment, we will call you to review the results.   Testing/Procedures: None ordered   Follow-Up: At [facility], you and your health needs are our priority.  As part of our continuing mission to provide you with exceptional heart care, we have created designated Provider Care Teams.  These Care Teams include your primary Cardiologist (physician) and Advanced Practice Providers (APPs -  Physician Assistants and Nurse Practitioners) who all work together to provide you with the care you need, when you need it.  We recommend signing up for the patient portal called "MyChart".  Sign up information is provided on this After Visit Summary.  MyChart is used to connect with patients for Virtual Visits (Telemedicine).  Patients are able to view lab/test results, encounter notes, upcoming appointments, etc.  Non-urgent messages can be sent to your provider as well.   To learn more about what you can do with MyChart, go to [email or web address].    Your next appointment:   6 month(s)  The format for your next appointment:   In Person  Provider:   You may see Tonny Bollman, MD or one of the following Advanced Practice Providers on your designated Care Team:    Tereso Newcomer, PA-C  Chelsea Aus, [place]    Other Instructions

## 2020-06-09 ENCOUNTER — Other Ambulatory Visit: Payer: Self-pay | Admitting: Vascular Surgery

## 2020-06-13 ENCOUNTER — Ambulatory Visit
Admission: RE | Admit: 2020-06-13 | Discharge: 2020-06-13 | Disposition: A | Discharge status: Home / Self Care | Payer: BC Managed Care – PPO | Source: Ambulatory Visit | Attending: Vascular Surgery | Admitting: Vascular Surgery

## 2020-06-13 DIAGNOSIS — I712 Thoracic aortic aneurysm, without rupture, unspecified: Secondary | ICD-10-CM

## 2020-06-13 DIAGNOSIS — I7101 Dissection of thoracic aorta: Secondary | ICD-10-CM | POA: Diagnosis not present

## 2020-06-13 DIAGNOSIS — I7102 Dissection of abdominal aorta: Secondary | ICD-10-CM | POA: Diagnosis not present

## 2020-06-13 MED ORDER — IOPAMIDOL (ISOVUE-300) INJECTION 61%
75.0000 mL | Freq: Once | INTRAVENOUS | Status: AC | PRN
  Administered 2020-06-13: 75 mL via INTRAVENOUS

## 2020-06-15 ENCOUNTER — Other Ambulatory Visit: Payer: Self-pay

## 2020-06-15 ENCOUNTER — Encounter: Payer: Self-pay | Admitting: Vascular Surgery

## 2020-06-15 ENCOUNTER — Ambulatory Visit: Payer: BC Managed Care – PPO | Admitting: Vascular Surgery

## 2020-06-15 VITALS — BP 124/75 | HR 59 | Temp 97.8°F | Resp 20 | Ht 66.0 in | Wt 150.0 lb

## 2020-06-15 DIAGNOSIS — I7101 Dissection of thoracic aorta: Secondary | ICD-10-CM | POA: Diagnosis not present

## 2020-06-15 DIAGNOSIS — I71019 Dissection of thoracic aorta, unspecified: Secondary | ICD-10-CM

## 2020-06-15 NOTE — Progress Notes (Signed)
Patient is a 59 year old female who returns for follow-up today.  She underwent repair of type B aortic dissection with a Gore tag graft June 2020.  She has recovered from her overall deconditioning although not quite back to baseline is probably about 90%.  She does not have any chest abdomen or back pain.  She states that occasionally her knees swell up and her toes have some intermittent numbness and tingling.  She states her blood pressure has been well controlled with blood pressure usually 100 systolic.  She has quit smoking.  Review of systems: She has no chest pain.  She has no shortness of breath.  Current Outpatient Medications on File Prior to Visit  Medication Sig Dispense Refill  . acetaminophen (TYLENOL) 500 MG tablet Take 1,000 mg by mouth every 6 (six) hours as needed for mild pain.    Marland Kitchen amLODipine (NORVASC) 10 MG tablet Take 1 tablet (10 mg total) by mouth daily. 90 tablet 3  . aspirin EC 81 MG EC tablet Take 1 tablet (81 mg total) by mouth daily.    . carvedilol (COREG) 25 MG tablet TAKE 1 TABLET BY MOUTH 2 TIMES DAILY WITH A MEAL. 180 tablet 3  . hydrALAZINE (APRESOLINE) 25 MG tablet Take 1 tablet (25 mg total) by mouth daily. 90 tablet 3   No current facility-administered medications on file prior to visit.    Past Medical History:  Diagnosis Date  . AAA (abdominal aortic aneurysm) (HCC)   . Ectopic pregnancy   . Thoracic aortic aneurysm (HCC) 02/2019  . Tubal pregnancy    Physical exam:  Vitals:   06/15/20 0851  BP: 124/75  Pulse: (!) 59  Resp: 20  Temp: 97.8 F (36.6 C)  SpO2: 98%  Weight: 150 lb (68 kg)  Height: 5\' 6"  (1.676 m)    Extremities: 2+ femoral 2+ dorsalis pedis 2+ posterior tibial pulses bilaterally  Abdomen: Soft nontender no pulsatile mass  Data: Patient had a CT angiogram of the chest abdomen and pelvis on June 13, 2020.  This shows the stent graft in good position just distal to the left subclavian artery and extending down to just  above the celiac artery.  True lumen feeds the mesenteric vessels.  Renals are patent.  The dissection extends into both iliac arteries but there is no flow-limiting stenosis.  CT scan is essentially unchanged from 1 year ago.  Infrarenal abdominal aorta is 2.5 cm in diameter perivisceral segment is 3.2 cm in diameter  Assessment: Doing well status post stent graft repair of type B aortic dissection no current evidence of aneurysm expansion or branch compromise.  Plan: Patient will continue with her current physical activity regimen to restore her back to baseline physical activity.  She will follow up with a repeat CT angiogram of the chest abdomen pelvis in 1 year.  She will try to keep her systolic blood pressure less than 140.  June 15, 2020, MD [facility] Office: [phone]

## 2021-01-09 ENCOUNTER — Telehealth: Payer: Self-pay

## 2021-01-09 ENCOUNTER — Other Ambulatory Visit: Payer: Self-pay

## 2021-01-09 ENCOUNTER — Encounter: Payer: Self-pay | Admitting: Physician Assistant

## 2021-01-09 ENCOUNTER — Ambulatory Visit: Payer: BC Managed Care – PPO | Admitting: Physician Assistant

## 2021-01-09 VITALS — BP 104/78 | HR 62 | Ht 66.0 in | Wt 155.2 lb

## 2021-01-09 DIAGNOSIS — M2540 Effusion, unspecified joint: Secondary | ICD-10-CM

## 2021-01-09 DIAGNOSIS — R21 Rash and other nonspecific skin eruption: Secondary | ICD-10-CM | POA: Diagnosis not present

## 2021-01-09 DIAGNOSIS — I10 Essential (primary) hypertension: Secondary | ICD-10-CM | POA: Diagnosis not present

## 2021-01-09 DIAGNOSIS — Z9889 Other specified postprocedural states: Secondary | ICD-10-CM

## 2021-01-09 NOTE — Telephone Encounter (Signed)
Heather Pollard notified and scheduled

## 2021-01-09 NOTE — Patient Instructions (Signed)
Medication Instructions:  Your physician has recommended you make the following change in your medication:  Stop the Hydralazine  *If you need a refill on your cardiac medications before your next appointment, please call your pharmacy*   Lab Work: None ordered  If you have labs (blood work) drawn today and your tests are completely normal, you will receive your results only by: Marland Kitchen MyChart Message (if you have MyChart) OR . A paper copy in the mail If you have any lab test that is abnormal or we need to change your treatment, we will call you to review the results.   Testing/Procedures: None ordered   Follow-Up: At [facility], you and your health needs are our priority.  As part of our continuing mission to provide you with exceptional heart care, we have created designated Provider Care Teams.  These Care Teams include your primary Cardiologist (physician) and Advanced Practice Providers (APPs -  Physician Assistants and Nurse Practitioners) who all work together to provide you with the care you need, when you need it.  We recommend signing up for the patient portal called "MyChart".  Sign up information is provided on this After Visit Summary.  MyChart is used to connect with patients for Virtual Visits (Telemedicine).  Patients are able to view lab/test results, encounter notes, upcoming appointments, etc.  Non-urgent messages can be sent to your provider as well.   To learn more about what you can do with MyChart, go to [email or web address].    Your next appointment:   6 month(s)  The format for your next appointment:   In Person  Provider:   Tonny Bollman, MD or Chelsea Aus, PA-C   Other Instructions

## 2021-01-09 NOTE — Progress Notes (Signed)
Cardiology Office Note:    Date:  01/09/2021   ID:  Heather Pollard, DOB [birth year], MRN [id number]  PCP:  Patient, No Pcp Per (Inactive)  [facility] Cardiologist:  Tonny Bollman, MD  [facility] Electrophysiologist:  None   Chief Complaint: 6 months follow up   History of Present Illness:    Heather Pollard is a 60 y.o. female with a hx of type B aortic dissections/p TEVAR, HTN andpriortobacco abuse(quit after surgery)seen for follow up.  Admitted to [facility] 01/2019 for acuteType B aortic dissection and hypertensive urgency. The patient was treated conservatively and discharged. However presented 02/17/2019 withrecurrent tearing sensation in her chest and substernal area. Work up reveled5 mm increase in size over 1 monthand under wentTEVAR. No complications.  Hx of Rash on high does of hydralazine.   Here today for follow up.  Reports good days and bad days.  During poor days her systolic blood pressure runs in 90s, this is  during early morning.  On good days, her systolic blood pressure always above 100.  Still has some rash on daily dosing of hydralazine.  Denies shortness of breath, dizziness, palpitation, orthopnea, PND, syncope, lower extremity edema or melena.  Few weeks ago patient had brief episode of left-sided sharp chest pain.  No associated symptoms at that time.  Reports joint swelling.  Has not established care with PCP yet.  Past Medical History:  Diagnosis Date  . AAA (abdominal aortic aneurysm) (HCC)   . Ectopic pregnancy   . HTN (hypertension)   . Thoracic aortic aneurysm (HCC) 02/2019  . Tubal pregnancy     Past Surgical History:  Procedure Laterality Date  . BREAST SURGERY    . ECTOPIC PREGNANCY SURGERY    . PARTIAL HYSTERECTOMY    . THORACIC AORTIC ENDOVASCULAR STENT GRAFT N/A 02/22/2019   Procedure: THORACIC AORTIC ENDOVASCULAR STENT GRAFT;  Surgeon: Sherren Kerns, MD;  Location: [facility] OR;  Service: Vascular;  Laterality: N/A;  .  ULTRASOUND GUIDANCE FOR VASCULAR ACCESS Right 02/22/2019   Procedure: Ultrasound Guidance For Vascular Access;  Surgeon: Sherren Kerns, MD;  Location: [facility] OR;  Service: Vascular;  Laterality: Right;    Current Medications: Current Meds  Medication Sig  . acetaminophen (TYLENOL) 500 MG tablet Take 1,000 mg by mouth every 6 (six) hours as needed for mild pain.  Marland Kitchen amLODipine (NORVASC) 10 MG tablet Take 1 tablet (10 mg total) by mouth daily.  Marland Kitchen aspirin EC 81 MG EC tablet Take 1 tablet (81 mg total) by mouth daily.  . carvedilol (COREG) 25 MG tablet TAKE 1 TABLET BY MOUTH 2 TIMES DAILY WITH A MEAL.  . [DISCONTINUED] hydrALAZINE (APRESOLINE) 25 MG tablet Take 1 tablet (25 mg total) by mouth daily.     Allergies:   Penicillins   Social History   Socioeconomic History  . Marital status: Married    Spouse name: Not on file  . Number of children: Not on file  . Years of education: Not on file  . Highest education level: Not on file  Occupational History  . Not on file  Tobacco Use  . Smoking status: Former Smoker    Types: Cigarettes    Quit date: 01/25/2019    Years since quitting: 1.9  . Smokeless tobacco: Never Used  Vaping Use  . Vaping Use: Never used  Substance and Sexual Activity  . Alcohol use: No  . Drug use: Never  . Sexual activity: Not on file  Other Topics Concern  . Not  on file  Social History Narrative  . Not on file   Social Determinants of Health   Financial Resource Strain: Not on file  Food Insecurity: Not on file  Transportation Needs: Not on file  Physical Activity: Not on file  Stress: Not on file  Social Connections: Not on file     Family History: The patient's family history includes Alzheimer's disease in her father; Cancer in her mother; Thyroid cancer in her sister.   ROS:   Please see the history of present illness.    All other systems reviewed and are negative.   EKGs/Labs/Other Studies Reviewed:    The following studies were reviewed  today:  Echocardiogram: 01/20/2019 1. The left ventricle has normal systolic function, with an ejection fraction of 60-65%. The cavity size was normal. There is mildly increased left ventricular wall thickness. No evidence of left ventricular regional wall motion abnormalities. 2. The right ventricle has normal systolc function. The cavity was normal. There is no increase in right ventricular wall thickness. 3. Left atrial size was mildly dilated. 4. Trivial pericardial effusion is present. 5. No evidence of mitral valve stenosis. Trivial mitral regurgitation. 6. The aortic valve is tricuspid Mild calcification of the aortic valve. No stenosis of the aortic valve. 7. The aortic root is normal in size and structure. 8. The IVC was normal in size. No complete TR doppler jet so unable to estimate PA systolic pressure  EKG:  EKG is ordered today.  The ekg ordered today demonstrates sinus rhythm and prolonged PR interval  Recent Labs: No results found for requested labs within last 8760 hours.  Recent Lipid Panel No results found for: CHOL, TRIG, HDL, CHOLHDL, VLDL, LDLCALC, LDLDIRECT   Physical Exam:    VS:  BP 104/78   Pulse 62   Ht 5\' 6"  (1.676 m)   Wt 155 lb 3.2 oz (70.4 kg)   SpO2 97%   BMI 25.05 kg/m     Wt Readings from Last 3 Encounters:  01/09/21 155 lb 3.2 oz (70.4 kg)  06/15/20 150 lb (68 kg)  06/06/20 140 lb (63.5 kg)     GEN: well nourished, well developed in no acute distress HEENT: Normal NECK: No JVD; No carotid bruits LYMPHATICS: No lymphadenopathy CARDIAC: RRR, no murmurs, rubs, gallops RESPIRATORY:  Clear to auscultation without rales, wheezing or rhonchi  ABDOMEN: Soft, non-tender, non-distended MUSCULOSKELETAL:  No edema; No deformity  SKIN: Warm and dry NEUROLOGIC:  Alert and oriented x 3 PSYCHIATRIC:  Normal affect   ASSESSMENT AND PLAN:   1.Type B aortic dissections/p TEVAR -Followed by vascular.  2.HTN -Excellently controlled.   Patient feels fatigue/poor when systolic blood pressure less than 90.  Still has some rash. -Stop hydralazine -Advised to take carvedilol 12 hours apart and in between takes amlodipine -She will keep track of her blood pressure  3.  Joint swelling -Recommended PCP establishment  4.  Sharp pain on left side of chest -Seems atypical for angina.  If recurrent episode advised to get vital sign or call EMS.  Does not necessarily go to ER.  Medication Adjustments/Labs and Tests Ordered: Current medicines are reviewed at length with the patient today.  Concerns regarding medicines are outlined above.  Orders Placed This Encounter  Procedures  . EKG 12-Lead   No orders of the defined types were placed in this encounter.   Patient Instructions  Medication Instructions:  Your physician has recommended you make the following change in your medication:  Stop the Hydralazine  *  If you need a refill on your cardiac medications before your next appointment, please call your pharmacy*   Lab Work: None ordered  If you have labs (blood work) drawn today and your tests are completely normal, you will receive your results only by: Marland Kitchen MyChart Message (if you have MyChart) OR . A paper copy in the mail If you have any lab test that is abnormal or we need to change your treatment, we will call you to review the results.   Testing/Procedures: None ordered   Follow-Up: At [facility], you and your health needs are our priority.  As part of our continuing mission to provide you with exceptional heart care, we have created designated Provider Care Teams.  These Care Teams include your primary Cardiologist (physician) and Advanced Practice Providers (APPs -  Physician Assistants and Nurse Practitioners) who all work together to provide you with the care you need, when you need it.  We recommend signing up for the patient portal called "MyChart".  Sign up information is provided on this After Visit  Summary.  MyChart is used to connect with patients for Virtual Visits (Telemedicine).  Patients are able to view lab/test results, encounter notes, upcoming appointments, etc.  Non-urgent messages can be sent to your provider as well.   To learn more about what you can do with MyChart, go to [email or web address].    Your next appointment:   6 month(s)  The format for your next appointment:   In Person  Provider:   Tonny Bollman, MD or Chelsea Aus, PA-C   Other Instructions      Signed, Manson Passey, [place]  01/09/2021 8:41 AM    [facility]

## 2021-01-09 NOTE — Telephone Encounter (Signed)
Yes, I am happy to take Heather Pollard, please schedule for new patient appointment.

## 2021-01-09 NOTE — Telephone Encounter (Signed)
Patient called asking if we could take her as a new patient-no PCP right now just seen specialists. Heather Pollard sees her husband Heather Pollard). I advised patient that we are not taking new patients but with this situation would ask Heather Pollard in case. Patient is aware we will let her know either way. Thank you.

## 2021-01-10 ENCOUNTER — Telehealth: Payer: Self-pay | Admitting: Physician Assistant

## 2021-01-10 NOTE — Telephone Encounter (Signed)
Follow Up:     Pt said her husband said she received 2 calls from this office today. Pt does not know who called.

## 2021-01-11 NOTE — Telephone Encounter (Signed)
Returned call to pt.  Not sure where the call came from, no testing or labs have been ordered. Apologized for the confusion of the calls to pt.

## 2021-01-26 ENCOUNTER — Ambulatory Visit (INDEPENDENT_AMBULATORY_CARE_PROVIDER_SITE_OTHER)
Admission: RE | Admit: 2021-01-26 | Discharge: 2021-01-26 | Disposition: A | Discharge status: Home / Self Care | Payer: BC Managed Care – PPO | Source: Ambulatory Visit | Attending: Primary Care | Admitting: Primary Care

## 2021-01-26 ENCOUNTER — Other Ambulatory Visit: Payer: Self-pay

## 2021-01-26 ENCOUNTER — Encounter: Payer: Self-pay | Admitting: Primary Care

## 2021-01-26 ENCOUNTER — Ambulatory Visit: Payer: BC Managed Care – PPO | Admitting: Primary Care

## 2021-01-26 ENCOUNTER — Ambulatory Visit
Admission: RE | Admit: 2021-01-26 | Discharge: 2021-01-26 | Disposition: A | Discharge status: Home / Self Care | Payer: BC Managed Care – PPO | Source: Ambulatory Visit | Attending: Primary Care | Admitting: Primary Care

## 2021-01-26 VITALS — BP 118/64 | HR 58 | Temp 98.7°F | Ht 66.0 in | Wt 155.0 lb

## 2021-01-26 DIAGNOSIS — M2550 Pain in unspecified joint: Secondary | ICD-10-CM

## 2021-01-26 DIAGNOSIS — M25561 Pain in right knee: Secondary | ICD-10-CM

## 2021-01-26 DIAGNOSIS — M545 Low back pain, unspecified: Secondary | ICD-10-CM | POA: Diagnosis not present

## 2021-01-26 DIAGNOSIS — M25562 Pain in left knee: Secondary | ICD-10-CM

## 2021-01-26 DIAGNOSIS — M549 Dorsalgia, unspecified: Secondary | ICD-10-CM

## 2021-01-26 DIAGNOSIS — G8929 Other chronic pain: Secondary | ICD-10-CM

## 2021-01-26 DIAGNOSIS — I10 Essential (primary) hypertension: Secondary | ICD-10-CM

## 2021-01-26 DIAGNOSIS — M2540 Effusion, unspecified joint: Secondary | ICD-10-CM

## 2021-01-26 DIAGNOSIS — R600 Localized edema: Secondary | ICD-10-CM | POA: Diagnosis not present

## 2021-01-26 DIAGNOSIS — Z8349 Family history of other endocrine, nutritional and metabolic diseases: Secondary | ICD-10-CM

## 2021-01-26 DIAGNOSIS — I7101 Dissection of thoracic aorta: Secondary | ICD-10-CM

## 2021-01-26 DIAGNOSIS — I71019 Dissection of thoracic aorta, unspecified: Secondary | ICD-10-CM

## 2021-01-26 DIAGNOSIS — M7989 Other specified soft tissue disorders: Secondary | ICD-10-CM | POA: Diagnosis not present

## 2021-01-26 LAB — CBC
HCT: 38.8 % (ref 36.0–46.0)
Hemoglobin: 13.3 g/dL (ref 12.0–15.0)
MCHC: 34.3 g/dL (ref 30.0–36.0)
MCV: 88.3 fl (ref 78.0–100.0)
Platelets: 226 10*3/uL (ref 150.0–400.0)
RBC: 4.4 Mil/uL (ref 3.87–5.11)
RDW: 13 % (ref 11.5–15.5)
WBC: 7.5 10*3/uL (ref 4.0–10.5)

## 2021-01-26 LAB — COMPREHENSIVE METABOLIC PANEL
ALT: 13 U/L (ref 0–35)
AST: 18 U/L (ref 0–37)
Albumin: 3.9 g/dL (ref 3.5–5.2)
Alkaline Phosphatase: 90 U/L (ref 39–117)
BUN: 15 mg/dL (ref 6–23)
CO2: 25 mEq/L (ref 19–32)
Calcium: 9.2 mg/dL (ref 8.4–10.5)
Chloride: 104 mEq/L (ref 96–112)
Creatinine, Ser: 0.98 mg/dL (ref 0.40–1.20)
GFR: 62.46 mL/min (ref >60.00)
Glucose, Bld: 92 mg/dL (ref 70–99)
Potassium: 4.2 mEq/L (ref 3.5–5.1)
Sodium: 138 mEq/L (ref 135–145)
Total Bilirubin: 0.4 mg/dL (ref 0.2–1.2)
Total Protein: 7.3 g/dL (ref 6.0–8.3)

## 2021-01-26 LAB — C-REACTIVE PROTEIN: CRP: 1.4 mg/dL (ref 0.5–20.0)

## 2021-01-26 LAB — URIC ACID: Uric Acid, Serum: 5.7 mg/dL (ref 2.4–7.0)

## 2021-01-26 LAB — SEDIMENTATION RATE: Sed Rate: 75 mm/hr — ABNORMAL HIGH (ref 0–30)

## 2021-01-26 LAB — TSH: TSH: 2.24 u[IU]/mL (ref 0.35–4.50)

## 2021-01-26 NOTE — Patient Instructions (Signed)
Stop by the lab and xray prior to leaving today. I will notify you of your results once received.   I will be in touch once I hear back from Vin.  It was a pleasure to meet you today! Please don't hesitate to call or message me with any questions. Welcome to [facility]!

## 2021-01-26 NOTE — Assessment & Plan Note (Signed)
Follows with vascular services and cardiology, both notes reviewed. CT scans from September 2021 reviewed.  BP under good control today. Will consult with cardiology to see if we can switch from amlodipine to maybe ACE/ARB or HCTZ given pedal edema.

## 2021-01-26 NOTE — Assessment & Plan Note (Signed)
Checking lumbar spine plain films.  Likely arthritic in etiology, could have spondylosis. Consider PT. Await results.

## 2021-01-26 NOTE — Progress Notes (Signed)
Subjective:    Patient ID: Heather Pollard, female    DOB: 1960/12/05, 60 y.o.   MRN: [id number]  HPI  Heather Pollard is a very pleasant 60 y.o. female who presents today to establish care and discuss the problems mentioned below. Will obtain/review records.  1) Aortic Dissection/Thoracic: History of Type B aortic dissection and hypertensive urgency in May 2020, admitted and treated conservatively. Returned to [facility] again in June 2020 with recurrent tearing sensation in chest, 5 mm increase in size over 1 month so she underwent thoracic aortic endovascular stent graft.   She follows with cardiology and vascular services, last visit was to cardiology in late April 2022. Is managed on carvedilol 25 mg BID and amlodipine 10 mg daily. She is no longer managed on hydralazine as this was recently discontinued due to hypotension. She does notice pedal edema.   She continues to feel fatigued and tired since her event and surgery in 2020. She follows with vascular surgery and underwent follow up with CT angio chest and abdomen in September 2021, due again this Fall. CT's were stable without change.   BP Readings from Last 3 Encounters:  01/26/21 118/64  01/09/21 104/78  06/15/20 124/75   2) Joint Pain: Chronic since her thoracic aortic endovascular stent graft surgery. Swelling and pain to her bilateral knees and feet. Also with bilateral lower back pain with radiation up to her mid thoracic back. She will feel her back "lock up and spasm" if she raises her arms above her head. She has to slowly lower her arms in order to prevent spasm.   She's taking Tylenol without improvement, her pain is daily. She does have a family history of autoimmune arthritis and thyroid disorder and thyroid cancer. She cannot take NSAID's given her medical history and surgery. She worked as a Education administrator for years, climbed up and down Paediatric nurse.  She is managed on amlodipine for hypertension, endorses that pedal edema has been  present since her surgery.      Review of Systems  Constitutional: Positive for fatigue.  Eyes: Negative for visual disturbance.  Respiratory: Negative for shortness of breath.   Cardiovascular: Negative for chest pain.       Pedal edema  Musculoskeletal: Positive for arthralgias, back pain and joint swelling.  Skin: Negative for color change.  Neurological: Negative for dizziness.         Past Medical History:  Diagnosis Date  . AAA (abdominal aortic aneurysm) (HCC)   . Ectopic pregnancy   . HTN (hypertension)   . Thoracic aortic aneurysm (HCC) 02/2019  . Tubal pregnancy     Social History   Socioeconomic History  . Marital status: Married    Spouse name: Not on file  . Number of children: Not on file  . Years of education: Not on file  . Highest education level: Not on file  Occupational History  . Not on file  Tobacco Use  . Smoking status: Former Smoker    Types: Cigarettes    Quit date: 01/25/2019    Years since quitting: 2.0  . Smokeless tobacco: Never Used  Vaping Use  . Vaping Use: Never used  Substance and Sexual Activity  . Alcohol use: No  . Drug use: Never  . Sexual activity: Not on file  Other Topics Concern  . Not on file  Social History Narrative  . Not on file   Social Determinants of Health   Financial Resource Strain: Not on file  Food Insecurity: Not  on file  Transportation Needs: Not on file  Physical Activity: Not on file  Stress: Not on file  Social Connections: Not on file  Intimate Partner Violence: Not on file    Past Surgical History:  Procedure Laterality Date  . BREAST SURGERY    . ECTOPIC PREGNANCY SURGERY    . PARTIAL HYSTERECTOMY    . THORACIC AORTIC ENDOVASCULAR STENT GRAFT N/A 02/22/2019   Procedure: THORACIC AORTIC ENDOVASCULAR STENT GRAFT;  Surgeon: Sherren Kerns, MD;  Location: [facility] OR;  Service: Vascular;  Laterality: N/A;  . ULTRASOUND GUIDANCE FOR VASCULAR ACCESS Right 02/22/2019   Procedure: Ultrasound  Guidance For Vascular Access;  Surgeon: Sherren Kerns, MD;  Location: [facility] -  OR;  Service: Vascular;  Laterality: Right;    Family History  Problem Relation Age of Onset  . Cancer Mother   . Alzheimer's disease Father   . Thyroid cancer Sister     Allergies  Allergen Reactions  . Penicillins Hives and Diarrhea    Did it involve swelling of the face/tongue/throat, SOB, or low BP? No Did it involve sudden or severe rash/hives, skin peeling, or any reaction on the inside of your mouth or nose? No Did you need to seek medical attention at a hospital or doctor's office? No When did it last happen? less than 10 years If all above answers are "NO", may proceed with cephalosporin use.     Current Outpatient Medications on File Prior to Visit  Medication Sig Dispense Refill  . acetaminophen (TYLENOL) 500 MG tablet Take 1,000 mg by mouth every 6 (six) hours as needed for mild pain.    Marland Kitchen amLODipine (NORVASC) 10 MG tablet Take 1 tablet (10 mg total) by mouth daily. 90 tablet 3  . aspirin EC 81 MG EC tablet Take 1 tablet (81 mg total) by mouth daily.    . carvedilol (COREG) 25 MG tablet TAKE 1 TABLET BY MOUTH 2 TIMES DAILY WITH A MEAL. 180 tablet 3   No current facility-administered medications on file prior to visit.    BP 118/64   Pulse (!) 58   Temp 98.7 F (37.1 C) (Temporal)   Ht 5\' 6"  (1.676 m)   Wt 155 lb (70.3 kg)   SpO2 97%   BMI 25.02 kg/m  Objective:   Physical Exam Cardiovascular:     Rate and Rhythm: Normal rate and regular rhythm.     Comments: Mild bilateral ankle and pedal edema noted. No pitting. 2+ DP and PT pulses.  Pulmonary:     Effort: Pulmonary effort is normal.     Breath sounds: Normal breath sounds.  Musculoskeletal:     Cervical back: Neck supple.     Thoracic back: Tenderness present. Decreased range of motion.     Lumbar back: Tenderness present. Decreased range of motion.       Back:     Right knee: Swelling present. Decreased range of  motion. No tenderness.     Left knee: No swelling. Decreased range of motion. No tenderness.     Comments: Decrease in ROM to lumbar and thoracic back, could not sit on exam table very long due to pain.   Mild swelling to right patellar region. Able to get up and down from exam table, does walk with mild limp. 5/5 strength bilaterally.  Skin:    General: Skin is warm and dry.           Assessment & Plan:      This visit occurred during  the SARS-CoV-2 public health emergency.  Safety protocols were in place, including screening questions prior to the visit, additional usage of staff PPE, and extensive cleaning of exam room while observing appropriate contact time as indicated for disinfecting solutions.

## 2021-01-26 NOTE — Assessment & Plan Note (Signed)
Long history of climbing up and down ladders so symptoms could be arthritic in nature.  Checking labs today to rule out RA and gout. Checking plain films of knees today.  Consider orthopedic evaluation vs PT.

## 2021-01-26 NOTE — Assessment & Plan Note (Signed)
Well controlled on carvedilol 25 mg BID and amlodipine 10 mg.   It does seem that she may be experiencing pedal edema as a side effect from amlodipine. BP goal is less than 140 systolic per vascular notes in September.  Recommend discontinuation of amlodipine, switch to HCTZ, ACE, or ARB. Will consult with cardiology to see if this is reasonable. Continue carvedilol 25 mg BID and amlodipine 10 mg for now.

## 2021-01-29 LAB — ANA: Anti Nuclear Antibody (ANA): NEGATIVE

## 2021-01-29 LAB — RHEUMATOID FACTOR: Rheumatoid fact SerPl-aCnc: 233 IU/mL — ABNORMAL HIGH (ref <14)

## 2021-01-29 LAB — CYCLIC CITRUL PEPTIDE ANTIBODY, IGG: Cyclic Citrullin Peptide Ab: >250 UNITS — ABNORMAL HIGH

## 2021-01-30 ENCOUNTER — Other Ambulatory Visit: Payer: Self-pay | Admitting: Primary Care

## 2021-01-30 DIAGNOSIS — I700 Atherosclerosis of aorta: Secondary | ICD-10-CM

## 2021-01-30 DIAGNOSIS — M0580 Other rheumatoid arthritis with rheumatoid factor of unspecified site: Secondary | ICD-10-CM

## 2021-01-30 DIAGNOSIS — M2550 Pain in unspecified joint: Secondary | ICD-10-CM

## 2021-01-30 DIAGNOSIS — I10 Essential (primary) hypertension: Secondary | ICD-10-CM

## 2021-01-30 MED ORDER — LOSARTAN POTASSIUM 50 MG PO TABS: 50.0000 mg | ORAL_TABLET | Freq: Every day | ORAL | 0 refills | Status: DC

## 2021-02-16 ENCOUNTER — Other Ambulatory Visit (INDEPENDENT_AMBULATORY_CARE_PROVIDER_SITE_OTHER): Payer: BC Managed Care – PPO

## 2021-02-16 ENCOUNTER — Other Ambulatory Visit: Payer: Self-pay

## 2021-02-16 DIAGNOSIS — I700 Atherosclerosis of aorta: Secondary | ICD-10-CM | POA: Diagnosis not present

## 2021-02-19 LAB — LIPID PANEL
Cholesterol: 257 mg/dL — ABNORMAL HIGH (ref 0–200)
HDL: 42.7 mg/dL (ref >39.00)
LDL Cholesterol: 175 mg/dL — ABNORMAL HIGH (ref 0–99)
NonHDL: 214.62
Total CHOL/HDL Ratio: 6
Triglycerides: 196 mg/dL — ABNORMAL HIGH (ref 0.0–149.0)
VLDL: 39.2 mg/dL (ref 0.0–40.0)

## 2021-02-19 LAB — COMPREHENSIVE METABOLIC PANEL
ALT: 15 U/L (ref 0–35)
AST: 22 U/L (ref 0–37)
Albumin: 3.5 g/dL (ref 3.5–5.2)
Alkaline Phosphatase: 75 U/L (ref 39–117)
BUN: 15 mg/dL (ref 6–23)
CO2: 25 mEq/L (ref 19–32)
Calcium: 8.9 mg/dL (ref 8.4–10.5)
Chloride: 103 mEq/L (ref 96–112)
Creatinine, Ser: 1.04 mg/dL (ref 0.40–1.20)
GFR: 58.14 mL/min — ABNORMAL LOW (ref >60.00)
Glucose, Bld: 93 mg/dL (ref 70–99)
Potassium: 4.5 mEq/L (ref 3.5–5.1)
Sodium: 136 mEq/L (ref 135–145)
Total Bilirubin: 0.3 mg/dL (ref 0.2–1.2)
Total Protein: 7 g/dL (ref 6.0–8.3)

## 2021-02-20 DIAGNOSIS — I10 Essential (primary) hypertension: Secondary | ICD-10-CM

## 2021-02-20 DIAGNOSIS — E785 Hyperlipidemia, unspecified: Secondary | ICD-10-CM

## 2021-02-21 MED ORDER — ROSUVASTATIN CALCIUM 10 MG PO TABS: 10.0000 mg | ORAL_TABLET | Freq: Every day | ORAL | 3 refills | Status: DC

## 2021-02-21 MED ORDER — HYDROCHLOROTHIAZIDE 12.5 MG PO TABS: 12.5000 mg | ORAL_TABLET | Freq: Every day | ORAL | 0 refills | Status: DC

## 2021-02-23 ENCOUNTER — Other Ambulatory Visit: Payer: Self-pay | Admitting: Primary Care

## 2021-02-23 DIAGNOSIS — I10 Essential (primary) hypertension: Secondary | ICD-10-CM

## 2021-03-07 ENCOUNTER — Ambulatory Visit: Payer: Self-pay

## 2021-03-07 ENCOUNTER — Ambulatory Visit: Payer: BC Managed Care – PPO | Admitting: Internal Medicine

## 2021-03-07 ENCOUNTER — Encounter: Payer: Self-pay | Admitting: Internal Medicine

## 2021-03-07 ENCOUNTER — Other Ambulatory Visit: Payer: Self-pay

## 2021-03-07 VITALS — BP 121/73 | HR 61 | Resp 16 | Ht 66.0 in | Wt 152.0 lb

## 2021-03-07 DIAGNOSIS — Z79899 Other long term (current) drug therapy: Secondary | ICD-10-CM | POA: Diagnosis not present

## 2021-03-07 DIAGNOSIS — M25562 Pain in left knee: Secondary | ICD-10-CM

## 2021-03-07 DIAGNOSIS — M549 Dorsalgia, unspecified: Secondary | ICD-10-CM | POA: Diagnosis not present

## 2021-03-07 DIAGNOSIS — M059 Rheumatoid arthritis with rheumatoid factor, unspecified: Secondary | ICD-10-CM | POA: Diagnosis not present

## 2021-03-07 DIAGNOSIS — G8929 Other chronic pain: Secondary | ICD-10-CM

## 2021-03-07 DIAGNOSIS — M25561 Pain in right knee: Secondary | ICD-10-CM | POA: Diagnosis not present

## 2021-03-07 MED ORDER — CYCLOBENZAPRINE HCL 10 MG PO TABS: 10.0000 mg | ORAL_TABLET | Freq: Every evening | ORAL | 0 refills | Status: DC | PRN

## 2021-03-07 NOTE — Progress Notes (Signed)
Office Visit Note  Patient: Heather Pollard             Date of Birth: 1961/01/25           MRN: [id number]             PCP: Pleas Koch, NP Referring: Pleas Koch, NP Visit Date: 03/07/2021 Occupation: Retired Curator  Subjective:  New Patient (Initial Visit) (Back pain, bil knee pain, bil hand pain, difficulty bending fingers, patient has not had COVID vaccines)   History of Present Illness: Heather Pollard is a 60 y.o. female here for evaluation of multiple joint pains with positive rheumatoid factor and elevated sedimentation rate.  She has history of joint pain since years ago but reports everything is significantly worse since her thoracic aortic aneurysm and repair surgery feels her overall condition was decreased.  She reports bilateral shoulders have trouble with raising overhead but pain or stiffness seems to go to the mid back.  She has mid and low back pain chronically and particularly bothers her at nighttime and laying still.  She has pain in the base of the left thumb has also noticed some stiffness and difficulty fully flexing and extending her fingers on the right hand.  She also describes bilateral knee pain worse on the medial margin of both sides and feels there is chronic swelling around this area.  The pain is bad at night but somewhat improved by resting a pillow between her knees her pain is also worsened with walking on them throughout the day.  She had previous x-rays that did show degenerative change of the lumbar spine but bilateral knee x-rays were unremarkable.  She has taken Tylenol does not feel it is helpful.  She avoids any other anti-inflammatory medicines due to interaction or worsening of her strict blood pressure control for aneurysm history.  Labs reviewed 02/2021 ANA neg RF 233 CCP >250 ESR 75 CRP 1.4 CBC wnl CMP wnl Uric acid 5.7  Imaging reviewed 01/2021 Lumbar spine xray with mild multilevel degenerative changes  Bilateral knee  xrays negative for any changes  Activities of Daily Living:  Patient reports morning stiffness for 24 hours.   Patient Denies nocturnal pain.  Difficulty dressing/grooming: Denies Difficulty climbing stairs: Reports Difficulty getting out of chair: Reports Difficulty using hands for taps, buttons, cutlery, and/or writing: Denies  Review of Systems  Constitutional:  Negative for fatigue.  HENT:  Negative for mouth dryness.   Eyes:  Negative for dryness.  Respiratory:  Negative for shortness of breath.   Cardiovascular:  Positive for swelling in legs/feet.  Gastrointestinal:  Negative for constipation.  Endocrine: Negative for excessive thirst.  Genitourinary:  Negative for difficulty urinating.  Musculoskeletal:  Positive for joint pain, gait problem, joint pain, joint swelling and morning stiffness.  Skin:  Negative for rash.  Allergic/Immunologic: Negative for susceptible to infections.  Neurological:  Positive for numbness.  Hematological:  Negative for bruising/bleeding tendency.  Psychiatric/Behavioral:  Positive for sleep disturbance.    PMFS History:  Patient Active Problem List   Diagnosis Date Noted   Seropositive rheumatoid arthritis ([place]) 03/07/2021   High risk medication use 03/07/2021   Essential (primary) hypertension 01/26/2021   Chronic pain of both knees 01/26/2021   Chronic midline back pain 01/26/2021   Thoracic aortic aneurysm ([place]) 02/17/2019   Aortic dissection distal to left subclavian ([place]) 01/19/2019    Past Medical History:  Diagnosis Date   AAA (abdominal aortic aneurysm) (HCC)    Ectopic pregnancy  HTN (hypertension)    Hypertensive emergency    Thoracic aortic aneurysm ([place]) 02/2019   Tubal pregnancy     Family History  Problem Relation Age of Onset   Cancer Mother        Peritoneal carcinoma    Heart disease Mother    Uterine cancer Mother    Hypothyroidism Mother    Alzheimer's disease Father    Stroke Father    Hypothyroidism  Sister    Thyroid cancer Sister    Hypothyroidism Sister    Hypothyroidism Sister    Arthritis Brother    Past Surgical History:  Procedure Laterality Date   BREAST SURGERY     ECTOPIC PREGNANCY SURGERY     PARTIAL HYSTERECTOMY     THORACIC AORTIC ENDOVASCULAR STENT GRAFT N/A 02/22/2019   Procedure: THORACIC AORTIC ENDOVASCULAR STENT GRAFT;  Surgeon: Elam Dutch, MD;  Location: [facility] OR;  Service: Vascular;  Laterality: N/A;   ULTRASOUND GUIDANCE FOR VASCULAR ACCESS Right 02/22/2019   Procedure: Ultrasound Guidance For Vascular Access;  Surgeon: Elam Dutch, MD;  Location[facility] OR;  Service: Vascular;  Laterality: Right;   Social History   Social History Narrative   Not on file   Immunization History  Administered Date(s) Administered   Pneumococcal Polysaccharide-23 01/20/2019     Objective: Vital Signs: BP 121/73 (BP Location: Right Arm, Patient Position: Sitting, Cuff Size: Normal)   Pulse 61   Resp 16   Ht $R'5\' 6"'LP$  (1.676 m)   Wt 152 lb (68.9 kg)   BMI 24.53 kg/m    Physical Exam Cardiovascular:     Rate and Rhythm: Normal rate and regular rhythm.  Pulmonary:     Effort: Pulmonary effort is normal.     Breath sounds: Normal breath sounds.  Skin:    General: Skin is warm and dry.     Findings: No rash.  Neurological:     General: No focal deficit present.     Mental Status: She is alert.  Psychiatric:        Mood and Affect: Mood normal.     Musculoskeletal Exam:  Neck full ROM no tenderness Shoulders full ROM overhead abduction provokes mid back pain around the base of the scapula b/l Elbows full ROM no tenderness or swelling Wrists full ROM no tenderness or swelling Fingers right hand no significant bony abnormalities appreciated there are early palmar fascia contractures proximal to the second and third MCP joints unable to fully close fingers, left hand first CMC joint squaring and tenderness to palpation, second MCP joint tenderness to palpation without  obvious synovitis Knees full ROM, medial joint line tenderness to palpation bilaterally, no visible joint effusion on ultrasound inspection, no obvious meniscal tear though there is increased color Doppler enhancement along medial joint margin Ankles full ROM no tenderness or swelling  CDAI Exam: CDAI Score: 9  Patient Global: 40 mm; Provider Global: 20 mm Swollen: 0 ; Tender: 4  Joint Exam 03/07/2021      Right  Left  CMC      Tender  MCP 2      Tender  Knee   Tender   Tender     Investigation: No additional findings.  Imaging: XR Hand 2 View Left  Result Date: 03/07/2021 X-ray left hand 2 views Radiocarpal joint space appears normal.  Moderate first CMC joint degenerative changes with partial joint subluxation with osteophytes sclerosis and subchondral cysts.  MCP joints appear normal.  PIP joints look okay DIP small  lateral osteophytes on second and third digits.  Bone mineralization appears normal. Impression Osteoarthritis worst at first [facility] joint moderate DIP involvement no erosive or inflammatory joint changes are seen  XR Hand 2 View Right  Result Date: 03/07/2021 X-ray right hand 2 views Radiocarpal carpal joint space appears normal.  MCP joints appear normal.  PIP and DIP joint spaces fairly intact mild lateral osteophytes at DIPs especially second and third.  Bone mineralization appears normal. Impression Mild DIP joint osteoarthritis, no significant inflammatory or erosive changes seen   Recent Labs: Lab Results  Component Value Date   WBC 7.5 01/26/2021   HGB 13.3 01/26/2021   PLT 226.0 01/26/2021   NA 136 02/16/2021   K 4.5 02/16/2021   CL 103 02/16/2021   CO2 25 02/16/2021   GLUCOSE 93 02/16/2021   BUN 15 02/16/2021   CREATININE 1.04 02/16/2021   BILITOT 0.3 02/16/2021   ALKPHOS 75 02/16/2021   AST 22 02/16/2021   ALT 15 02/16/2021   PROT 7.0 02/16/2021   ALBUMIN 3.5 02/16/2021   CALCIUM 8.9 02/16/2021   GFRAA 59 (L) 02/23/2019    Speciality  Comments: No specialty comments available.  Procedures:  No procedures performed Allergies: Penicillins   Assessment / Plan:     Visit Diagnoses: Seropositive rheumatoid arthritis ([place]) - Plan: XR Hand 2 View Right, XR Hand 2 View Left, Sedimentation rate  Symptoms appear likely to represent a seropositive rheumatoid arthritis.  Her joint distribution of symptoms is atypical and she does not describe common inflammatory changes of stiffness or easing with movement however there is significant bilateral symmetric joint pain not otherwise explained by osteoarthritis change on x-ray imaging with color Doppler enhancement of synovial lining on ultrasound inspection.  We will check bilateral hand x-rays for any evidence of erosive disease.  Also check sedimentation rate for indication of active inflammation.  We will definitely follow-up and will consider starting DMARD treatment if symptoms remain similar or increased inflammatory symptoms.  Chronic pain of both knees  Chronic bilateral knee pain worsens with use and weightbearing there is no effusion but also does not have significant osteoarthritis on the x-rays.  Plan to treat with immunosuppressive treatment, if these were nonresponsive would probably consider knee MRI.  Chronic midline back pain, unspecified back location - Plan: cyclobenzaprine (FLEXERIL) 10 MG tablet  Back pain also including mid back pain with paraspinal muscles and scapular stabilization muscles nighttime symptoms suggest some spasticity component.  Recommend trial of as needed Flexeril 10 mg at nighttime to help with symptoms during sleep.  High risk medication use - Plan: Hepatitis B core antibody, IgM, Hepatitis B surface antigen, Hepatitis C antibody  Anticipate trial of starting methotrexate we will check baseline hepatitis serology.  She has had recent metabolic panel that showed normal liver function and adequate renal function.  Orders: Orders Placed This  Encounter  Procedures   XR Hand 2 View Right   XR Hand 2 View Left   Hepatitis B core antibody, IgM   Hepatitis B surface antigen   Hepatitis C antibody   Sedimentation rate   Meds ordered this encounter  Medications   cyclobenzaprine (FLEXERIL) 10 MG tablet    Sig: Take 1 tablet (10 mg total) by mouth at bedtime as needed for muscle spasms.    Dispense:  30 tablet    Refill:  0     Follow-Up Instructions: Return in about 4 weeks (around 04/04/2021).   Collier Salina, MD  Note - This  record has been created using Dragon software.  Chart creation errors have been sought, but may not always  have been located. Such creation errors do not reflect on  the standard of medical care.  

## 2021-03-08 LAB — SEDIMENTATION RATE: Sed Rate: 31 mm/h — ABNORMAL HIGH (ref 0–30)

## 2021-03-08 LAB — HEPATITIS B SURFACE ANTIGEN: Hepatitis B Surface Ag: NONREACTIVE

## 2021-03-08 LAB — HEPATITIS B CORE ANTIBODY, IGM: Hep B C IgM: NONREACTIVE

## 2021-03-08 LAB — HEPATITIS C ANTIBODY
Hepatitis C Ab: NONREACTIVE
SIGNAL TO CUT-OFF: 0.02 (ref <1.00)

## 2021-03-08 NOTE — Progress Notes (Signed)
Lab tests are negative for hepatitis screening so that would not be an issue for starting treatment. Lab marker for inflammation is less than a month ago although not completely normal, so might have been seeing her at different amount of symptoms, and would benefit to recheck as planned.

## 2021-03-08 NOTE — Progress Notes (Signed)
There are some options besides methotrexate but we will probably need to sit and have a discussion about these regarding risks and benefits.

## 2021-03-09 ENCOUNTER — Telehealth: Payer: Self-pay | Admitting: Primary Care

## 2021-03-09 DIAGNOSIS — I10 Essential (primary) hypertension: Secondary | ICD-10-CM

## 2021-03-09 MED ORDER — HYDROCHLOROTHIAZIDE 12.5 MG PO TABS: 12.5000 mg | ORAL_TABLET | Freq: Every day | ORAL | 0 refills | Status: DC

## 2021-03-09 NOTE — Telephone Encounter (Signed)
Patient states that her BP medicine is working. She is not having Headaches or any side effects from it .   LAST APPOINTMENT DATE: 02/23/2021   NEXT APPOINTMENT DATE:@6 /28/2022  MEDICATION: hydrochlorothiazide   PH[facility]ACY: [facility] [address] [place]  Let patient know to contact pharmacy at the end of the day to make sure medication is ready.  Please notify patient to allow 48-72 hours to process  Encourage patient to contact the pharmacy for refills or they can request refills through [facility]  CLINICAL FILLS OUT ALL BELOW:   LAST REFILL:  QTY:  REFILL DATE:    OTHER COMMENTS:    Okay for refill?  Please advise

## 2021-03-09 NOTE — Addendum Note (Signed)
Addended by: Doreene Nest on: 03/09/2021 01:19 PM   Modules accepted: Orders

## 2021-03-09 NOTE — Telephone Encounter (Signed)
Please thank you for the update, I will send refills.  We will see her on the first as scheduled.

## 2021-03-12 ENCOUNTER — Ambulatory Visit: Payer: BC Managed Care – PPO | Admitting: Internal Medicine

## 2021-03-13 ENCOUNTER — Other Ambulatory Visit: Payer: BC Managed Care – PPO

## 2021-03-16 ENCOUNTER — Ambulatory Visit (INDEPENDENT_AMBULATORY_CARE_PROVIDER_SITE_OTHER): Payer: BC Managed Care – PPO | Admitting: Primary Care

## 2021-03-16 ENCOUNTER — Other Ambulatory Visit: Payer: Self-pay

## 2021-03-16 ENCOUNTER — Encounter: Payer: Self-pay | Admitting: Primary Care

## 2021-03-16 VITALS — BP 124/62 | HR 76 | Temp 98.7°F | Ht 66.0 in | Wt 150.0 lb

## 2021-03-16 DIAGNOSIS — Z1231 Encounter for screening mammogram for malignant neoplasm of breast: Secondary | ICD-10-CM

## 2021-03-16 DIAGNOSIS — I10 Essential (primary) hypertension: Secondary | ICD-10-CM | POA: Diagnosis not present

## 2021-03-16 DIAGNOSIS — E785 Hyperlipidemia, unspecified: Secondary | ICD-10-CM | POA: Diagnosis not present

## 2021-03-16 DIAGNOSIS — Z1211 Encounter for screening for malignant neoplasm of colon: Secondary | ICD-10-CM | POA: Diagnosis not present

## 2021-03-16 LAB — LIPID PANEL
Cholesterol: 178 mg/dL (ref 0–200)
HDL: 46.6 mg/dL (ref >39.00)
LDL Cholesterol: 103 mg/dL — ABNORMAL HIGH (ref 0–99)
NonHDL: 131.37
Total CHOL/HDL Ratio: 4
Triglycerides: 141 mg/dL (ref 0.0–149.0)
VLDL: 28.2 mg/dL (ref 0.0–40.0)

## 2021-03-16 LAB — COMPREHENSIVE METABOLIC PANEL
ALT: 15 U/L (ref 0–35)
AST: 22 U/L (ref 0–37)
Albumin: 3.9 g/dL (ref 3.5–5.2)
Alkaline Phosphatase: 73 U/L (ref 39–117)
BUN: 18 mg/dL (ref 6–23)
CO2: 27 mEq/L (ref 19–32)
Calcium: 9.2 mg/dL (ref 8.4–10.5)
Chloride: 103 mEq/L (ref 96–112)
Creatinine, Ser: 1.15 mg/dL (ref 0.40–1.20)
GFR: 51.51 mL/min — ABNORMAL LOW (ref >60.00)
Glucose, Bld: 101 mg/dL — ABNORMAL HIGH (ref 70–99)
Potassium: 4.1 mEq/L (ref 3.5–5.1)
Sodium: 138 mEq/L (ref 135–145)
Total Bilirubin: 0.5 mg/dL (ref 0.2–1.2)
Total Protein: 7.5 g/dL (ref 6.0–8.3)

## 2021-03-16 NOTE — Assessment & Plan Note (Signed)
Well controlled in the office today, continue HCTZ 12.5 mg and carvedilol 25 mg BID.   CMP pending.

## 2021-03-16 NOTE — Patient Instructions (Signed)
Stop by the lab prior to leaving today. I will notify you of your results once received.   You will be contacted regarding your referral to GI.  Please let us know if you have not been contacted within two weeks.   [facility] to schedule your mammogram.   It was a pleasure to see you today!

## 2021-03-16 NOTE — Progress Notes (Signed)
Subjective:    Patient ID: Heather Pollard, female    DOB: September 06, 1961, 60 y.o.   MRN: [id number]  HPI  [name] is a very pleasant 60 y.o. 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  She is also due for mammogram and colonoscopy.   She is currently managed on HCTZ 12.5 mg and carvedilol 25 mg BID for hypertension. Previously managed on amlodipine but this was discontinued given chronic ankle/pedal swelling. She is checking her BP which is running 110's-115's/50's-60's.   Labs from last month revealed an LDL of 175, she was not on statin therapy at the time. Is now managed on Crestor 10 mg which was initiated 4 weeks ago.   BP Readings from Last 3 Encounters:  03/16/21 124/62  03/07/21 121/73  01/26/21 118/64     Review of Systems  Respiratory:  Negative for shortness of breath.   Cardiovascular:  Negative for chest pain and leg swelling.  Neurological:  Negative for dizziness and headaches.        Past Medical History:  Diagnosis Date   AAA (abdominal aortic aneurysm) (HCC)    Ectopic pregnancy    HTN (hypertension)    Hypertensive emergency    Thoracic aortic aneurysm (HCC) 02/2019   Tubal pregnancy     Social History   Socioeconomic History   Marital status: Married    Spouse name: Not on file   Number of children: Not on file   Years of education: Not on file   Highest education level: Not on file  Occupational History   Not on file  Tobacco Use   Smoking status: Former    Packs/day: 1.00    Years: 20.00    Pack years: 20.00    Types: Cigarettes    Quit date: 01/25/2019    Years since quitting: 2.1   Smokeless tobacco: Never  Vaping Use   Vaping Use: Never used  Substance and Sexual Activity   Alcohol use: No   Drug use: Never   Sexual activity: Not on file  Other Topics Concern   Not on file  Social History  Narrative   Not on file   Social Determinants of Health   Financial Resource Strain: Not on file  Food Insecurity: Not on file  Transportation Needs: Not on file  Physical Activity: Not on file  Stress: Not on file  Social Connections: Not on file  Intimate Partner Violence: Not on file    Past Surgical History:  Procedure Laterality Date   BREAST SURGERY     ECTOPIC PREGNANCY SURGERY     PARTIAL HYSTERECTOMY     THORACIC AORTIC ENDOVASCULAR STENT GRAFT N/A 02/22/2019   Procedure: THORACIC AORTIC ENDOVASCULAR STENT GRAFT;  Surgeon: Sherren Kerns, MD;  Location: [facility] OR;  Service: Vascular;  Laterality: N/A;   ULTRASOUND GUIDANCE FOR VASCULAR ACCESS Right 02/22/2019   Procedure: Ultrasound Guidance For Vascular Access;  Surgeon: Sherren Kerns, MD;  Location: [facility] OR;  Service: Vascular;  Laterality: Right;    Family History  Problem Relation Age of Onset   Cancer Mother        Peritoneal carcinoma    Heart disease Mother    Uterine cancer Mother    Hypothyroidism Mother    Alzheimer's disease Father    Stroke Father    Hypothyroidism Sister    Thyroid cancer Sister    Hypothyroidism Sister  Hypothyroidism Sister    Arthritis Brother     Allergies  Allergen Reactions   Penicillins Hives and Diarrhea    Did it involve swelling of the face/tongue/throat, SOB, or low BP? No Did it involve sudden or severe rash/hives, skin peeling, or any reaction on the inside of your mouth or nose? No Did you need to seek medical attention at a hospital or doctor's office? No When did it last happen?  less than 10 years      If all above answers are "NO", may proceed with cephalosporin use.     Current Outpatient Medications on File Prior to Visit  Medication Sig Dispense Refill   aspirin EC 81 MG EC tablet Take 1 tablet (81 mg total) by mouth daily.     carvedilol (COREG) 25 MG tablet TAKE 1 TABLET BY MOUTH 2 TIMES DAILY WITH A MEAL. 180 tablet 3   cyclobenzaprine (FLEXERIL) 10  MG tablet Take 1 tablet (10 mg total) by mouth at bedtime as needed for muscle spasms. 30 tablet 0   hydrochlorothiazide (HYDRODIURIL) 12.5 MG tablet Take 1 tablet (12.5 mg total) by mouth daily. For blood pressure 90 tablet 0   rosuvastatin (CRESTOR) 10 MG tablet Take 1 tablet (10 mg total) by mouth daily. For cholesterol. 90 tablet 3   No current facility-administered medications on file prior to visit.    BP 124/62   Pulse 76   Temp 98.7 F (37.1 C) (Temporal)   Ht 5\' 6"  (1.676 m)   Wt 150 lb (68 kg)   SpO2 98%   BMI 24.21 kg/m  Objective:   Physical Exam Cardiovascular:     Rate and Rhythm: Normal rate and regular rhythm.  Pulmonary:     Effort: Pulmonary effort is normal.     Breath sounds: Normal breath sounds.  Musculoskeletal:     Cervical back: Neck supple.  Skin:    General: Skin is warm and dry.          Assessment & Plan:      This visit occurred during the SARS-CoV-2 public health emergency.  Safety protocols were in place, including screening questions prior to the visit, additional usage of staff PPE, and extensive cleaning of exam room while observing appropriate contact time as indicated for disinfecting solutions.

## 2021-03-16 NOTE — Assessment & Plan Note (Signed)
Chronic and uncontrolled last month, LDL of 175. Compliant to Crestor 10 mg, doesn't appear to be experiencing myalgias.   Repeat lipids and LFT's pending. Continue Crestor 10 mg.

## 2021-04-03 ENCOUNTER — Ambulatory Visit: Payer: BC Managed Care – PPO | Admitting: Internal Medicine

## 2021-05-22 ENCOUNTER — Ambulatory Visit: Payer: BC Managed Care – PPO | Admitting: Primary Care

## 2021-05-22 ENCOUNTER — Encounter: Payer: Self-pay | Admitting: Primary Care

## 2021-05-22 ENCOUNTER — Other Ambulatory Visit: Payer: Self-pay

## 2021-05-22 ENCOUNTER — Other Ambulatory Visit (HOSPITAL_COMMUNITY)
Admission: RE | Admit: 2021-05-22 | Discharge: 2021-05-22 | Disposition: A | Discharge status: Home / Self Care | Payer: BC Managed Care – PPO | Source: Ambulatory Visit | Attending: Primary Care | Admitting: Primary Care

## 2021-05-22 VITALS — BP 122/64 | HR 59 | Temp 97.6°F | Ht 66.0 in | Wt 154.0 lb

## 2021-05-22 DIAGNOSIS — Z124 Encounter for screening for malignant neoplasm of cervix: Secondary | ICD-10-CM

## 2021-05-22 DIAGNOSIS — I10 Essential (primary) hypertension: Secondary | ICD-10-CM | POA: Diagnosis not present

## 2021-05-22 DIAGNOSIS — Z0000 Encounter for general adult medical examination without abnormal findings: Secondary | ICD-10-CM | POA: Diagnosis not present

## 2021-05-22 DIAGNOSIS — I71019 Dissection of thoracic aorta, unspecified: Secondary | ICD-10-CM

## 2021-05-22 DIAGNOSIS — M25561 Pain in right knee: Secondary | ICD-10-CM

## 2021-05-22 DIAGNOSIS — Z23 Encounter for immunization: Secondary | ICD-10-CM

## 2021-05-22 DIAGNOSIS — I711 Thoracic aortic aneurysm, ruptured, unspecified: Secondary | ICD-10-CM

## 2021-05-22 DIAGNOSIS — I7101 Dissection of thoracic aorta: Secondary | ICD-10-CM | POA: Diagnosis not present

## 2021-05-22 DIAGNOSIS — Z1211 Encounter for screening for malignant neoplasm of colon: Secondary | ICD-10-CM

## 2021-05-22 DIAGNOSIS — E785 Hyperlipidemia, unspecified: Secondary | ICD-10-CM | POA: Diagnosis not present

## 2021-05-22 DIAGNOSIS — M25562 Pain in left knee: Secondary | ICD-10-CM

## 2021-05-22 DIAGNOSIS — M549 Dorsalgia, unspecified: Secondary | ICD-10-CM

## 2021-05-22 DIAGNOSIS — G8929 Other chronic pain: Secondary | ICD-10-CM

## 2021-05-22 LAB — BASIC METABOLIC PANEL
BUN: 17 mg/dL (ref 6–23)
CO2: 26 mEq/L (ref 19–32)
Calcium: 9.4 mg/dL (ref 8.4–10.5)
Chloride: 103 mEq/L (ref 96–112)
Creatinine, Ser: 1.16 mg/dL (ref 0.40–1.20)
GFR: 50.91 mL/min — ABNORMAL LOW (ref >60.00)
Glucose, Bld: 94 mg/dL (ref 70–99)
Potassium: 4.4 mEq/L (ref 3.5–5.1)
Sodium: 136 mEq/L (ref 135–145)

## 2021-05-22 LAB — LIPID PANEL
Cholesterol: 166 mg/dL (ref 0–200)
HDL: 52.2 mg/dL (ref >39.00)
LDL Cholesterol: 93 mg/dL (ref 0–99)
NonHDL: 113.46
Total CHOL/HDL Ratio: 3
Triglycerides: 101 mg/dL (ref 0.0–149.0)
VLDL: 20.2 mg/dL (ref 0.0–40.0)

## 2021-05-22 LAB — HEMOGLOBIN A1C: Hgb A1c MFr Bld: 6.1 % (ref 4.6–6.5)

## 2021-05-22 MED ORDER — HYDROCHLOROTHIAZIDE 12.5 MG PO TABS: 12.5000 mg | ORAL_TABLET | Freq: Every day | ORAL | 3 refills | Status: DC

## 2021-05-22 NOTE — Assessment & Plan Note (Signed)
Chronic and stable Continue to monitor 

## 2021-05-22 NOTE — Patient Instructions (Signed)
Stop by the lab prior to leaving today. I will notify you of your results once received.   Schedule a nurse visit for 2-6 months to complete your second Shingles vaccine.   It was a pleasure to see you today!  Preventive Care 28-61 Years Old, Female Preventive care refers to lifestyle choices and visits with your health care provider that can promote health and wellness. This includes: A yearly physical exam. This is also called an annual wellness visit. Regular dental and eye exams. Immunizations. Screening for certain conditions. Healthy lifestyle choices, such as: Eating a healthy diet. Getting regular exercise. Not using drugs or products that contain nicotine and tobacco. Limiting alcohol use. What can I expect for my preventive care visit? Physical exam Your health care provider will check your: Height and weight. These may be used to calculate your BMI (body mass index). BMI is a measurement that tells if you are at a healthy weight. Heart rate and blood pressure. Body temperature. Skin for abnormal spots. Counseling Your health care provider may ask you questions about your: Past medical problems. Family's medical history. Alcohol, tobacco, and drug use. Emotional well-being. Home life and relationship well-being. Sexual activity. Diet, exercise, and sleep habits. Work and work Statistician. Access to firearms. Method of birth control. Menstrual cycle. Pregnancy history. What immunizations do I need? Vaccines are usually given at various ages, according to a schedule. Your health care provider will recommend vaccines for you based on your age, medical history, and lifestyle or other factors, such as travel or where you work. What tests do I need? Blood tests Lipid and cholesterol levels. These may be checked every 5 years, or more often if you are over 70 years old. Hepatitis C test. Hepatitis B test. Screening Lung cancer screening. You may have this screening  every year starting at age 24 if you have a 30-pack-year history of smoking and currently smoke or have quit within the past 15 years. Colorectal cancer screening. All adults should have this screening starting at age 94 and continuing until age 20. Your health care provider may recommend screening at age 64 if you are at increased risk. You will have tests every 1-10 years, depending on your results and the type of screening test. Diabetes screening. This is done by checking your blood sugar (glucose) after you have not eaten for a while (fasting). You may have this done every 1-3 years. Mammogram. This may be done every 1-2 years. Talk with your health care provider about when you should start having regular mammograms. This may depend on whether you have a family history of breast cancer. BRCA-related cancer screening. This may be done if you have a family history of breast, ovarian, tubal, or peritoneal cancers. Pelvic exam and Pap test. This may be done every 3 years starting at age 7. Starting at age 35, this may be done every 5 years if you have a Pap test in combination with an HPV test. Other tests STD (sexually transmitted disease) testing, if you are at risk. Bone density scan. This is done to screen for osteoporosis. You may have this scan if you are at high risk for osteoporosis. Talk with your health care provider about your test results, treatment options, and if necessary, the need for more tests. Follow these instructions at home: Eating and drinking  Eat a diet that includes fresh fruits and vegetables, whole grains, lean protein, and low-fat dairy products. Take vitamin and mineral supplements as recommended by your health  care provider. Do not drink alcohol if: Your health care provider tells you not to drink. You are pregnant, may be pregnant, or are planning to become pregnant. If you drink alcohol: Limit how much you have to 0-1 drink a day. Be aware of how much  alcohol is in your drink. In the [place] one drink equals one 12 oz bottle of beer (355 mL), one 5 oz glass of wine (148 mL), or one 1 oz glass of hard liquor (44 mL). Lifestyle Take daily care of your teeth and gums. Brush your teeth every morning and night with fluoride toothpaste. Floss one time each day. Stay active. Exercise for at least 30 minutes 5 or more days each week. Do not use any products that contain nicotine or tobacco, such as cigarettes, e-cigarettes, and chewing tobacco. If you need help quitting, ask your health care provider. Do not use drugs. If you are sexually active, practice safe sex. Use a condom or other form of protection to prevent STIs (sexually transmitted infections). If you do not wish to become pregnant, use a form of birth control. If you plan to become pregnant, see your health care provider for a prepregnancy visit. If told by your health care provider, take low-dose aspirin daily starting at age 33. Find healthy ways to cope with stress, such as: Meditation, yoga, or listening to music. Journaling. Talking to a trusted person. Spending time with friends and family. Safety Always wear your seat belt while driving or riding in a vehicle. Do not drive: If you have been drinking alcohol. Do not ride with someone who has been drinking. When you are tired or distracted. While texting. Wear a helmet and other protective equipment during sports activities. If you have firearms in your house, make sure you follow all gun safety procedures. What's next? Visit your health care provider once a year for an annual wellness visit. Ask your health care provider how often you should have your eyes and teeth checked. Stay up to date on all vaccines. This information is not intended to replace advice given to you by your health care provider. Make sure you discuss any questions you have with your health care provider. Document Revised: 11/10/2020 Document Reviewed:  05/14/2018 Elsevier Patient Education  2022 [employer].

## 2021-05-22 NOTE — Assessment & Plan Note (Signed)
Compliant to rosuvastatin 10 mg, repeat lipid panel pending. 

## 2021-05-22 NOTE — Addendum Note (Signed)
Addended by: Donnamarie Poag on: 05/22/2021 08:34 AM   Modules accepted: Orders

## 2021-05-22 NOTE — Assessment & Plan Note (Signed)
Follows with vascular surgery annually, due in October 2022.

## 2021-05-22 NOTE — Progress Notes (Signed)
Subjective:    Patient ID: Heather Pollard, female    DOB: 09/18/1960, 60 y.o.   MRN: [id number]  HPI  Heather Pollard is a very pleasant 60 y.o. female who presents today for complete physical and follow up of chronic conditions.  She would also like an excuse for [employer] duty. She is summoned for June 19, 2021 for [facility]. She doesn't want to participate in jury duty due to risk for contracting Covid-19, cannot sit for long periods of time due to her chronic pain, and chronic urinary frequency. She has to urinate twice every hour. She is managed on HCTZ for hypertension. She also has an appointment scheduled on June 19, 2021 with her vascular surgeon.   Immunizations: -Tetanus: Completed within 10 years.  -Influenza: Declines  -Covid-19: Has not received -Shingles: Never completed   -Pneumonia: 2020  Diet: Fair diet.  Exercise: No regular exercise.  Eye exam: Completes annually  Dental exam: Completes semi-annually   Pap Smear: Hysterectomy, patient endorses she still has cervix.  Mammogram: Due Colonoscopy: Never completed   BP Readings from Last 3 Encounters:  05/22/21 122/64  03/16/21 124/62  03/07/21 121/73       Review of Systems  Constitutional:  Negative for unexpected weight change.  HENT:  Negative for rhinorrhea.   Eyes:  Negative for visual disturbance.  Respiratory:  Negative for cough and shortness of breath.   Cardiovascular:  Negative for chest pain.  Gastrointestinal:  Negative for constipation and diarrhea.  Genitourinary:  Negative for difficulty urinating and menstrual problem.  Musculoskeletal:  Negative for arthralgias and myalgias.  Skin:  Negative for rash.  Allergic/Immunologic: Negative for environmental allergies.  Neurological:  Negative for dizziness, numbness and headaches.  Psychiatric/Behavioral:  The patient is not nervous/anxious.         Past Medical History:  Diagnosis Date   AAA (abdominal aortic aneurysm) (HCC)     Ectopic pregnancy    HTN (hypertension)    Hypertensive emergency    Thoracic aortic aneurysm (HCC) 02/2019   Tubal pregnancy     Social History   Socioeconomic History   Marital status: Married    Spouse name: Not on file   Number of children: Not on file   Years of education: Not on file   Highest education level: Not on file  Occupational History   Not on file  Tobacco Use   Smoking status: Former    Packs/day: 1.00    Years: 20.00    Pack years: 20.00    Types: Cigarettes    Quit date: 01/25/2019    Years since quitting: 2.3   Smokeless tobacco: Never  Vaping Use   Vaping Use: Never used  Substance and Sexual Activity   Alcohol use: No   Drug use: Never   Sexual activity: Not on file  Other Topics Concern   Not on file  Social History Narrative   Not on file   Social Determinants of Health   Financial Resource Strain: Not on file  Food Insecurity: Not on file  Transportation Needs: Not on file  Physical Activity: Not on file  Stress: Not on file  Social Connections: Not on file  Intimate Partner Violence: Not on file    Past Surgical History:  Procedure Laterality Date   BREAST SURGERY     ECTOPIC PREGNANCY SURGERY     PARTIAL HYSTERECTOMY     THORACIC AORTIC ENDOVASCULAR STENT GRAFT N/A 02/22/2019   Procedure: THORACIC AORTIC ENDOVASCULAR STENT GRAFT;  Surgeon: Sherren Kerns, MD;  [facility];  Service: Vascular;  Laterality: N/A;   ULTRASOUND GUIDANCE FOR VASCULAR ACCESS Right 02/22/2019   Procedure: Ultrasound Guidance For Vascular Access;  Surgeon: Sherren Kerns, MD;  Location: [facility] OR;  Service: Vascular;  Laterality: Right;    Family History  Problem Relation Age of Onset   Cancer Mother        Peritoneal carcinoma    Heart disease Mother    Uterine cancer Mother    Hypothyroidism Mother    Alzheimer's disease Father    Stroke Father    Hypothyroidism Sister    Thyroid cancer Sister    Hypothyroidism Sister    Hypothyroidism  Sister    Arthritis Brother     Allergies  Allergen Reactions   Penicillins Hives and Diarrhea    Did it involve swelling of the face/tongue/throat, SOB, or low BP? No Did it involve sudden or severe rash/hives, skin peeling, or any reaction on the inside of your mouth or nose? No Did you need to seek medical attention at a hospital or doctor's office? No When did it last happen?  less than 10 years      If all above answers are "NO", may proceed with cephalosporin use.     Current Outpatient Medications on File Prior to Visit  Medication Sig Dispense Refill   aspirin EC 81 MG EC tablet Take 1 tablet (81 mg total) by mouth daily.     carvedilol (COREG) 25 MG tablet TAKE 1 TABLET BY MOUTH 2 TIMES DAILY WITH A MEAL. 180 tablet 3   rosuvastatin (CRESTOR) 10 MG tablet Take 1 tablet (10 mg total) by mouth daily. For cholesterol. 90 tablet 3   No current facility-administered medications on file prior to visit.    BP 122/64   Pulse (!) 59   Temp 97.6 F (36.4 C) (Temporal)   Ht 5\' 6"  (1.676 m)   Wt 154 lb (69.9 kg)   SpO2 100%   BMI 24.86 kg/m  Objective:   Physical Exam HENT:     Right Ear: Tympanic membrane and ear canal normal.     Left Ear: Tympanic membrane and ear canal normal.     Nose: Nose normal.  Eyes:     Conjunctiva/sclera: Conjunctivae normal.     Pupils: Pupils are equal, round, and reactive to light.  Neck:     Thyroid: No thyromegaly.  Cardiovascular:     Rate and Rhythm: Normal rate and regular rhythm.     Heart sounds: No murmur heard. Pulmonary:     Effort: Pulmonary effort is normal.     Breath sounds: Normal breath sounds. No rales.  Abdominal:     General: Bowel sounds are normal.     Palpations: Abdomen is soft.     Tenderness: There is no abdominal tenderness.  Genitourinary:    Labia:        Right: No tenderness or lesion.        Left: No tenderness or lesion.      Vagina: Normal.     Cervix: No discharge or erythema.  Musculoskeletal:         General: Normal range of motion.     Cervical back: Neck supple.  Lymphadenopathy:     Cervical: No cervical adenopathy.  Skin:    General: Skin is warm and dry.     Findings: No rash.  Neurological:     Mental Status: She is alert and oriented to  person, place, and time.     Cranial Nerves: No cranial nerve deficit.     Deep Tendon Reflexes: Reflexes are normal and symmetric.  Psychiatric:        Mood and Affect: Mood normal.          Assessment & Plan:      This visit occurred during the SARS-CoV-2 public health emergency.  Safety protocols were in place, including screening questions prior to the visit, additional usage of staff PPE, and extensive cleaning of exam room while observing appropriate contact time as indicated for disinfecting solutions.

## 2021-05-22 NOTE — Assessment & Plan Note (Signed)
Stable, no concerns today. Continue to monitor.  

## 2021-05-22 NOTE — Assessment & Plan Note (Addendum)
Following with vascular surgery, appointment scheduled for June 19, 2021.  BP under great control. Continue aspirin 81 mg and rosuvastatin 10 mg.

## 2021-05-22 NOTE — Assessment & Plan Note (Signed)
Well controlled in the office today, continue carvedilol 25 mg BID and HCTZ 12.5 mg.   CMP pending.

## 2021-05-22 NOTE — Assessment & Plan Note (Addendum)
Shingrix due, first dose provided. Tetanus UTD.  Pap smear due, completed today. Mammogram due, orders placed. Colonoscopy due, referral placed to GI.  Encouraged a healthy diet, regular exercise.   Exam today stable. Labs pending.

## 2021-05-23 ENCOUNTER — Encounter: Payer: Self-pay | Admitting: Primary Care

## 2021-05-25 LAB — CYTOLOGY - PAP
Comment: NEGATIVE
Diagnosis: NEGATIVE
High risk HPV: NEGATIVE

## 2021-05-31 ENCOUNTER — Other Ambulatory Visit: Payer: Self-pay

## 2021-05-31 DIAGNOSIS — I71019 Dissection of thoracic aorta, unspecified: Secondary | ICD-10-CM

## 2021-05-31 DIAGNOSIS — I7101 Dissection of thoracic aorta: Secondary | ICD-10-CM

## 2021-06-05 NOTE — Telephone Encounter (Signed)
I placed her jury letter and the jury duty form in your inbox before we moved from [place]. Do you have this?

## 2021-06-11 ENCOUNTER — Other Ambulatory Visit: Payer: Self-pay | Admitting: Primary Care

## 2021-06-11 DIAGNOSIS — I10 Essential (primary) hypertension: Secondary | ICD-10-CM

## 2021-06-12 ENCOUNTER — Other Ambulatory Visit: Payer: Self-pay

## 2021-06-12 ENCOUNTER — Ambulatory Visit (HOSPITAL_COMMUNITY)
Admission: RE | Admit: 2021-06-12 | Discharge: 2021-06-12 | Disposition: A | Discharge status: Home / Self Care | Payer: BC Managed Care – PPO | Source: Ambulatory Visit | Attending: Vascular Surgery | Admitting: Vascular Surgery

## 2021-06-12 DIAGNOSIS — I7101 Dissection of thoracic aorta: Secondary | ICD-10-CM | POA: Diagnosis not present

## 2021-06-12 DIAGNOSIS — I71019 Dissection of thoracic aorta, unspecified: Secondary | ICD-10-CM

## 2021-06-12 DIAGNOSIS — I700 Atherosclerosis of aorta: Secondary | ICD-10-CM | POA: Diagnosis not present

## 2021-06-12 DIAGNOSIS — I723 Aneurysm of iliac artery: Secondary | ICD-10-CM | POA: Diagnosis not present

## 2021-06-12 DIAGNOSIS — I7772 Dissection of iliac artery: Secondary | ICD-10-CM | POA: Diagnosis not present

## 2021-06-12 DIAGNOSIS — I7102 Dissection of abdominal aorta: Secondary | ICD-10-CM | POA: Diagnosis not present

## 2021-06-12 MED ORDER — IOHEXOL 350 MG/ML SOLN
80.0000 mL | Freq: Once | INTRAVENOUS | Status: AC | PRN
  Administered 2021-06-12: 80 mL via INTRAVENOUS

## 2021-06-13 ENCOUNTER — Telehealth: Payer: Self-pay | Admitting: Cardiovascular Disease

## 2021-06-13 MED ORDER — CARVEDILOL 25 MG PO TABS: ORAL_TABLET | ORAL | 1 refills | Status: DC

## 2021-06-13 NOTE — Telephone Encounter (Signed)
Pt's medication was sent to pt's pharmacy as requested. Confirmation received.  °

## 2021-06-13 NOTE — Telephone Encounter (Signed)
*  STAT* If patient is at the pharmacy, call can be transferred to refill team.   1. Which medications need to be refilled? (please list name of each medication and dose if known)  carvedilol (COREG) 25 MG tablet  2. Which pharmacy/location (including street and city if local pharmacy) is medication to be sent to? [id number] - [place], [place] - [address]  3. Do they need a 30 day or 90 day supply? 90 with 3 refills

## 2021-06-19 ENCOUNTER — Ambulatory Visit: Payer: BC Managed Care – PPO | Admitting: Vascular Surgery

## 2021-06-22 ENCOUNTER — Ambulatory Visit
Admission: RE | Admit: 2021-06-22 | Discharge: 2021-06-22 | Disposition: A | Discharge status: Home / Self Care | Payer: BC Managed Care – PPO | Source: Ambulatory Visit | Attending: Primary Care | Admitting: Primary Care

## 2021-06-22 ENCOUNTER — Other Ambulatory Visit: Payer: Self-pay

## 2021-06-22 DIAGNOSIS — Z1231 Encounter for screening mammogram for malignant neoplasm of breast: Secondary | ICD-10-CM | POA: Diagnosis not present

## 2021-06-27 ENCOUNTER — Other Ambulatory Visit: Payer: Self-pay | Admitting: Primary Care

## 2021-06-27 DIAGNOSIS — R928 Other abnormal and inconclusive findings on diagnostic imaging of breast: Secondary | ICD-10-CM

## 2021-06-29 ENCOUNTER — Other Ambulatory Visit: Payer: Self-pay | Admitting: Primary Care

## 2021-06-29 ENCOUNTER — Other Ambulatory Visit: Payer: Self-pay

## 2021-06-29 ENCOUNTER — Ambulatory Visit
Admission: RE | Admit: 2021-06-29 | Discharge: 2021-06-29 | Disposition: A | Discharge status: Home / Self Care | Payer: BC Managed Care – PPO | Source: Ambulatory Visit | Attending: Primary Care | Admitting: Primary Care

## 2021-06-29 DIAGNOSIS — R928 Other abnormal and inconclusive findings on diagnostic imaging of breast: Secondary | ICD-10-CM

## 2021-06-29 DIAGNOSIS — R922 Inconclusive mammogram: Secondary | ICD-10-CM | POA: Diagnosis not present

## 2021-06-29 DIAGNOSIS — R921 Mammographic calcification found on diagnostic imaging of breast: Secondary | ICD-10-CM | POA: Diagnosis not present

## 2021-06-29 DIAGNOSIS — N6452 Nipple discharge: Secondary | ICD-10-CM | POA: Diagnosis not present

## 2021-07-01 NOTE — Progress Notes (Signed)
[facility] [place]  ASSESSMENT / PLAN: 60 y.o. 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 Darrick Penna.  Overall, the repair appears good on most recent CT angiogram.  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  Follow-up with me in 1 year with repeat CT angiogram of the chest, abdomen, and pelvis.  CHIEF COMPLAINT: Surveillance after type B aortic dissection  HISTORY OF PRESENT ILLNESS: Heather Pollard is a 60 y.o. female who presents to clinic for surveillance of type B aortic dissection.  This was initially diagnosed in 2020.  The patient was initiated on medical therapy, but had propagation of her aortic dissection.  This prompted TEVAR by Dr. Darrick Penna February 22, 2019.  She did well from this.  She initially had difficulty managing her blood pressure, and was often hypotensive making her sluggish.  Her blood pressure regimen has been tailored and she tolerates Coreg and hydrochlorothiazide quite well.  She has many excellent questions about surveillance for type B aortic dissection, we spent the majority of the clinic visit discussing the rationale for surveillance and worrisome symptoms to look out for.  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 Darrick Penna  VASCULAR RISK FACTORS: Negative history of stroke / transient ischemic attack. Negative history of coronary artery disease.  Negative history of diabetes mellitus.  Positive history of smoking. Not actively smoking. Positive history of hypertension.  Negative history of chronic kidney disease.  Negative history of chronic obstructive pulmonary disease.  FUNCTIONAL STATUS: ECOG performance status: (0) Fully active, able to carry on all predisease performance without  restriction Ambulatory status: Ambulatory within the community without limits  Past Medical History:  Diagnosis Date   AAA (abdominal aortic aneurysm)    Ectopic pregnancy    HTN (hypertension)    Hypertensive emergency    Thoracic aortic aneurysm 02/2019   Tubal pregnancy     Past Surgical History:  Procedure Laterality Date   BREAST SURGERY     ECTOPIC PREGNANCY SURGERY     PARTIAL HYSTERECTOMY     THORACIC AORTIC ENDOVASCULAR STENT GRAFT N/A 02/22/2019   Procedure: THORACIC AORTIC ENDOVASCULAR STENT GRAFT;  Surgeon: Sherren Kerns, MD;  Location: [facility] OR;  Service: Vascular;  Laterality: N/A;   ULTRASOUND GUIDANCE FOR VASCULAR ACCESS Right 02/22/2019   Procedure: Ultrasound Guidance For Vascular Access;  Surgeon: Sherren Kerns, MD;  Location: [facility] OR;  Service: Vascular;  Laterality: Right;    Family History  Problem Relation Age of Onset   Cancer Mother        Peritoneal carcinoma    Heart disease Mother    Uterine cancer Mother    Hypothyroidism Mother    Alzheimer's disease Father    Stroke Father    Hypothyroidism Sister    Thyroid cancer Sister    Hypothyroidism Sister    Hypothyroidism Sister    Arthritis Brother     Social History   Socioeconomic History   Marital status: Married    Spouse name: Not on file   Number of children: Not on file   Years of education: Not on file   Highest education level: Not on file  Occupational History   Not on file  Tobacco Use   Smoking status: Former    Packs/day: 1.00    Years: 20.00    Pack years: 20.00    Types: Cigarettes    Quit date: 01/25/2019    Years since quitting: 2.4   Smokeless tobacco: Never  Vaping Use   Vaping Use: Never used  Substance and Sexual Activity   Alcohol use: No   Drug use: Never   Sexual activity: Not on file  Other Topics Concern   Not on file  Social History Narrative   Not on file   Social Determinants of Health   Financial Resource Strain: Not on file  Food Insecurity:  Not on file  Transportation Needs: Not on file  Physical Activity: Not on file  Stress: Not on file  Social Connections: Not on file  Intimate Partner Violence: Not on file    Allergies  Allergen Reactions   Penicillins Hives and Diarrhea    Did it involve swelling of the face/tongue/throat, SOB, or low BP? No Did it involve sudden or severe rash/hives, skin peeling, or any reaction on the inside of your mouth or nose? No Did you need to seek medical attention at a hospital or doctor's office? No When did it last happen?  less than 10 years      If all above answers are "NO", may proceed with cephalosporin use.     Current Outpatient Medications  Medication Sig Dispense Refill   aspirin EC 81 MG EC tablet Take 1 tablet (81 mg total) by mouth daily.     carvedilol (COREG) 25 MG tablet TAKE 1 TABLET BY MOUTH 2 TIMES DAILY WITH A MEAL. 180 tablet 1   hydrochlorothiazide (HYDRODIURIL) 12.5 MG tablet Take 1 tablet (12.5 mg total) by mouth daily. For blood pressure 90 tablet 3   rosuvastatin (CRESTOR) 10 MG tablet Take 1 tablet (10 mg total) by mouth daily. For cholesterol. 90 tablet 3   No current facility-administered medications for this visit.    REVIEW OF SYSTEMS:  [X]  denotes positive finding, [ ]  denotes negative finding Cardiac  Comments:  Chest pain or chest pressure:    Shortness of breath upon exertion:    Short of breath when lying flat:    Irregular heart rhythm:        Vascular    Pain in calf, thigh, or hip brought on by ambulation:    Pain in feet at night that wakes you up from your sleep:  x   Blood clot in your veins:    Leg swelling:         Pulmonary    Oxygen at home:    Productive cough:     Wheezing:         Neurologic    Sudden weakness in arms or legs:     Sudden numbness in arms or legs:     Sudden onset of difficulty speaking or slurred speech:    Temporary loss of vision in one eye:     Problems with dizziness:         Gastrointestinal     Blood in stool:     Vomited blood:         Genitourinary    Burning when urinating:     Blood in urine:        Psychiatric    Major depression:         Hematologic    Bleeding problems:    Problems with blood clotting too easily:  Skin    Rashes or ulcers:        Constitutional    Fever or chills:      PHYSICAL EXAM Vitals:   07/03/21 0824  BP: 119/67  Pulse: 61  Resp: 20  Temp: 98.3 F (36.8 C)  SpO2: 99%  Weight: 151 lb (68.5 kg)  Height: 5\' 6"  (1.676 m)    Constitutional: well appearing. no distress. Appears well nourished.  Neurologic: CN intact. no focal findings. no sensory loss. Psychiatric:  Mood and affect symmetric and appropriate. Eyes:  No icterus. No conjunctival pallor. Ears, nose, throat:  mucous membranes moist. Midline trachea.  Cardiac: regular rate and rhythm.  Respiratory:  unlabored. Abdominal:  soft, non-tender, non-distended.  Peripheral vascular: 1+ DP pulses bilaterally Extremity: Trace edema bilateral lower extremities. No cyanosis. No pallor.  Skin: No gangrene. No ulceration.  Lymphatic: No Stemmer's sign. No palpable lymphadenopathy.  PERTINENT LABORATORY AND RADIOLOGIC DATA  Most recent CBC CBC Latest Ref Rng & Units 01/26/2021 02/23/2019 02/21/2019  WBC 4.0 - 10.5 K/uL 7.5 15.1(H) 8.4  Hemoglobin 12.0 - 15.0 g/dL 04/23/2019 10.5(L) 11.3(L)  Hematocrit 36.0 - 46.0 % 38.8 31.4(L) 33.0(L)  Platelets 150.0 - 400.0 K/uL 226.0 311 345     Most recent CMP CMP Latest Ref Rng & Units 05/22/2021 03/16/2021 02/16/2021  Glucose 70 - 99 mg/dL 94 04/18/2021) 93  BUN 6 - 23 mg/dL 17 18 15   Creatinine 0.40 - 1.20 mg/dL 637(C 5.88  Sodium 135 - 145 mEq/L 136 138 136  Potassium 3.5 - 5.1 mEq/L 4.4 4.1 4.5  Chloride 96 - 112 mEq/L 103 103 103  CO2 19 - 32 mEq/L 26 27 25   Calcium 8.4 - 10.5 mg/dL 9.4 9.2 8.9  Total Protein 6.0 - 8.3 g/dL - 7.5 7.0  Total Bilirubin 0.2 - 1.2 mg/dL - 0.5 0.3  Alkaline Phos 39 - 117 U/L - 73 75  AST 0 - 37 U/L - 22  22  ALT 0 - 35 U/L - 15 15    Renal function CrCl cannot be calculated (Patient's most recent lab result is older than the maximum 21 days allowed.).  Hgb A1c MFr Bld (%)  Date Value  05/22/2021 6.1    LDL Cholesterol  Date Value Ref Range Status  05/22/2021 93 0 - 99 mg/dL Final     Vascular Imaging: Most recent CT angiogram personally reviewed in detail.  TEVAR appears well-seated without evidence of endoleak.  Chronic dissection process throughout the abdominal aorta.  The perivisceral abdominal aorta is aneurysmal, but stable measuring 3.5 cm.  There is ectasia of the right common iliac artery.  . 07/22/2021, MD [facility] Phone Number: [phone] 07/03/2021 8:49 AM  Total time spent on preparing this encounter including chart review, data review, collecting history, examining the patient, coordinating care for this established patient, 30 minutes.  Portions of this report may have been transcribed using voice recognition software.  Every effort has been made to ensure accuracy; however, inadvertent computerized transcription errors may still be present.

## 2021-07-03 ENCOUNTER — Ambulatory Visit: Payer: BC Managed Care – PPO | Admitting: Vascular Surgery

## 2021-07-03 ENCOUNTER — Encounter: Payer: Self-pay | Admitting: Vascular Surgery

## 2021-07-03 ENCOUNTER — Other Ambulatory Visit: Payer: Self-pay

## 2021-07-03 VITALS — BP 119/67 | HR 61 | Temp 98.3°F | Resp 20 | Ht 66.0 in | Wt 151.0 lb

## 2021-07-03 DIAGNOSIS — Z9889 Other specified postprocedural states: Secondary | ICD-10-CM

## 2021-07-03 DIAGNOSIS — Z8679 Personal history of other diseases of the circulatory system: Secondary | ICD-10-CM

## 2021-08-27 ENCOUNTER — Emergency Department (HOSPITAL_COMMUNITY)
Admission: EM | Admit: 2021-08-27 | Discharge: 2021-08-28 | Disposition: A | Discharge status: Home / Self Care | Payer: BC Managed Care – PPO | Attending: Emergency Medicine | Admitting: Emergency Medicine

## 2021-08-27 ENCOUNTER — Other Ambulatory Visit: Payer: Self-pay

## 2021-08-27 ENCOUNTER — Emergency Department (HOSPITAL_COMMUNITY): Payer: BC Managed Care – PPO

## 2021-08-27 ENCOUNTER — Encounter (HOSPITAL_COMMUNITY): Payer: Self-pay

## 2021-08-27 DIAGNOSIS — Z87891 Personal history of nicotine dependence: Secondary | ICD-10-CM | POA: Insufficient documentation

## 2021-08-27 DIAGNOSIS — R1033 Periumbilical pain: Secondary | ICD-10-CM | POA: Diagnosis not present

## 2021-08-27 DIAGNOSIS — I714 Abdominal aortic aneurysm, without rupture, unspecified: Secondary | ICD-10-CM | POA: Diagnosis not present

## 2021-08-27 DIAGNOSIS — I10 Essential (primary) hypertension: Secondary | ICD-10-CM | POA: Insufficient documentation

## 2021-08-27 DIAGNOSIS — U071 COVID-19: Secondary | ICD-10-CM | POA: Diagnosis not present

## 2021-08-27 DIAGNOSIS — D72829 Elevated white blood cell count, unspecified: Secondary | ICD-10-CM | POA: Diagnosis not present

## 2021-08-27 DIAGNOSIS — Z7982 Long term (current) use of aspirin: Secondary | ICD-10-CM | POA: Insufficient documentation

## 2021-08-27 DIAGNOSIS — I700 Atherosclerosis of aorta: Secondary | ICD-10-CM | POA: Diagnosis not present

## 2021-08-27 DIAGNOSIS — Z79899 Other long term (current) drug therapy: Secondary | ICD-10-CM | POA: Diagnosis not present

## 2021-08-27 DIAGNOSIS — R1030 Lower abdominal pain, unspecified: Secondary | ICD-10-CM | POA: Diagnosis not present

## 2021-08-27 LAB — LIPASE, BLOOD: Lipase: 46 U/L (ref 11–51)

## 2021-08-27 LAB — CBC WITH DIFFERENTIAL/PLATELET
Abs Immature Granulocytes: 0.01 10*3/uL (ref 0.00–0.07)
Basophils Absolute: 0 10*3/uL (ref 0.0–0.1)
Basophils Relative: 1 %
Eosinophils Absolute: 0 10*3/uL (ref 0.0–0.5)
Eosinophils Relative: 1 %
HCT: 38.7 % (ref 36.0–46.0)
Hemoglobin: 13.1 g/dL (ref 12.0–15.0)
Immature Granulocytes: 0 %
Lymphocytes Relative: 13 %
Lymphs Abs: 0.5 10*3/uL — ABNORMAL LOW (ref 0.7–4.0)
MCH: 29.8 pg (ref 26.0–34.0)
MCHC: 33.9 g/dL (ref 30.0–36.0)
MCV: 88.2 fL (ref 80.0–100.0)
Monocytes Absolute: 1 10*3/uL (ref 0.1–1.0)
Monocytes Relative: 28 %
Neutro Abs: 2.1 10*3/uL (ref 1.7–7.7)
Neutrophils Relative %: 57 %
Platelets: 169 10*3/uL (ref 150–400)
RBC: 4.39 MIL/uL (ref 3.87–5.11)
RDW: 12.7 % (ref 11.5–15.5)
WBC: 3.7 10*3/uL — ABNORMAL LOW (ref 4.0–10.5)
nRBC: 0 % (ref 0.0–0.2)

## 2021-08-27 LAB — COMPREHENSIVE METABOLIC PANEL
ALT: 64 U/L — ABNORMAL HIGH (ref 0–44)
AST: 68 U/L — ABNORMAL HIGH (ref 15–41)
Albumin: 3.4 g/dL — ABNORMAL LOW (ref 3.5–5.0)
Alkaline Phosphatase: 70 U/L (ref 38–126)
Anion gap: 9 (ref 5–15)
BUN: 18 mg/dL (ref 8–23)
CO2: 21 mmol/L — ABNORMAL LOW (ref 22–32)
Calcium: 8.6 mg/dL — ABNORMAL LOW (ref 8.9–10.3)
Chloride: 101 mmol/L (ref 98–111)
Creatinine, Ser: 1.56 mg/dL — ABNORMAL HIGH (ref 0.44–1.00)
GFR, Estimated: 38 mL/min — ABNORMAL LOW (ref >60)
Glucose, Bld: 109 mg/dL — ABNORMAL HIGH (ref 70–99)
Potassium: 3.4 mmol/L — ABNORMAL LOW (ref 3.5–5.1)
Sodium: 131 mmol/L — ABNORMAL LOW (ref 135–145)
Total Bilirubin: 0.3 mg/dL (ref 0.3–1.2)
Total Protein: 6.5 g/dL (ref 6.5–8.1)

## 2021-08-27 LAB — I-STAT CHEM 8, ED
BUN: 19 mg/dL (ref 8–23)
Calcium, Ion: 1.11 mmol/L — ABNORMAL LOW (ref 1.15–1.40)
Chloride: 99 mmol/L (ref 98–111)
Creatinine, Ser: 1.7 mg/dL — ABNORMAL HIGH (ref 0.44–1.00)
Glucose, Bld: 106 mg/dL — ABNORMAL HIGH (ref 70–99)
HCT: 39 % (ref 36.0–46.0)
Hemoglobin: 13.3 g/dL (ref 12.0–15.0)
Potassium: 3.4 mmol/L — ABNORMAL LOW (ref 3.5–5.1)
Sodium: 134 mmol/L — ABNORMAL LOW (ref 135–145)
TCO2: 23 mmol/L (ref 22–32)

## 2021-08-27 MED ORDER — ONDANSETRON HCL 4 MG/2ML IJ SOLN
4.0000 mg | Freq: Once | INTRAMUSCULAR | Status: AC
  Administered 2021-08-27: 4 mg via INTRAVENOUS
  Filled 2021-08-27: qty 2

## 2021-08-27 MED ORDER — HYDROMORPHONE HCL 1 MG/ML IJ SOLN
1.0000 mg | Freq: Once | INTRAMUSCULAR | Status: AC
Start: 2021-08-27 — End: 2021-08-27
  Administered 2021-08-27: 1 mg via INTRAVENOUS
  Filled 2021-08-27: qty 1

## 2021-08-27 MED ORDER — SODIUM CHLORIDE 0.9 % IV BOLUS
1000.0000 mL | Freq: Once | INTRAVENOUS | Status: AC
  Administered 2021-08-27: 1000 mL via INTRAVENOUS

## 2021-08-27 MED ORDER — IOHEXOL 350 MG/ML SOLN
80.0000 mL | Freq: Once | INTRAVENOUS | Status: AC | PRN
  Administered 2021-08-27: 80 mL via INTRAVENOUS

## 2021-08-27 MED ORDER — MORPHINE SULFATE (PF) 4 MG/ML IV SOLN
4.0000 mg | Freq: Once | INTRAVENOUS | Status: AC
  Administered 2021-08-27: 4 mg via INTRAVENOUS
  Filled 2021-08-27: qty 1

## 2021-08-27 NOTE — ED Notes (Signed)
Patient transported to CT 

## 2021-08-27 NOTE — ED Provider Notes (Signed)
[facility] EMERGENCY DEPARTMENT Provider Note   CSN: [id number] Arrival date & time: 08/27/21  1829     History Chief Complaint  Patient presents with   Abdominal Pain    Heather Pollard is a 60 y.o. female.  60 y.o female with a PMH AAA, HTN presents to the ED with a chief complaint of abdominal pain of sudden onset last night around 7 PM.  Patient reports she was at home when she began experiencing pain around her navel, this is described as a shear, ripping pain similar to when she had her thoracic aneurysm repaired.  Patient did go under intervention by Dr. Darrick Penna, reports she is on blood pressure medication at this time to manage her prior aneurysm.  Reports taking Tylenol for the pain without any improvement in symptoms.  There is no alleviating or exacerbating factors.  She does report feeling very cold, reports where preceded by a headache.  She also endorses some nausea but has not had any episodes of vomiting.  Her last bowel movement was yesterday without any blood in her stool, no prior surgical history of her abdomen.  Denies any fever, sick contacts, diarrhea.  The history is provided by the patient and medical records.  Abdominal Pain Pain location:  Periumbilical Pain quality: tearing   Pain radiates to:  Suprapubic region Pain severity:  Severe Onset quality:  Sudden Duration:  12 hours Timing:  Constant Progression:  Unchanged Chronicity:  New Context: awakening from sleep   Relieved by:  Nothing Worsened by:  Nothing Ineffective treatments:  Acetaminophen Associated symptoms: nausea   Associated symptoms: no chest pain, no chills, no diarrhea, no fever, no shortness of breath, no sore throat and no vomiting       Past Medical History:  Diagnosis Date   AAA (abdominal aortic aneurysm)    Ectopic pregnancy    HTN (hypertension)    Hypertensive emergency    Thoracic aortic aneurysm 02/2019   Tubal pregnancy     Patient Active  Problem List   Diagnosis Date Noted   Preventative health care 05/22/2021   Hyperlipidemia 03/16/2021   Seropositive rheumatoid arthritis (HCC) 03/07/2021   High risk medication use 03/07/2021   Essential (primary) hypertension 01/26/2021   Chronic pain of both knees 01/26/2021   Chronic midline back pain 01/26/2021   Thoracic aortic aneurysm 02/17/2019   Aortic dissection distal to left subclavian 01/19/2019    Past Surgical History:  Procedure Laterality Date   BREAST SURGERY     ECTOPIC PREGNANCY SURGERY     PARTIAL HYSTERECTOMY     THORACIC AORTIC ENDOVASCULAR STENT GRAFT N/A 02/22/2019   Procedure: THORACIC AORTIC ENDOVASCULAR STENT GRAFT;  Surgeon: Sherren Kerns, MD;  Location: [facility] OR;  Service: Vascular;  Laterality: N/A;   ULTRASOUND GUIDANCE FOR VASCULAR ACCESS Right 02/22/2019   Procedure: Ultrasound Guidance For Vascular Access;  Surgeon: Sherren Kerns, MD;  Location: [facility] OR;  Service: Vascular;  Laterality: Right;     OB History   No obstetric history on file.     Family History  Problem Relation Age of Onset   Cancer Mother        Peritoneal carcinoma    Heart disease Mother    Uterine cancer Mother    Hypothyroidism Mother    Alzheimer's disease Father    Stroke Father    Hypothyroidism Sister    Thyroid cancer Sister    Hypothyroidism Sister    Hypothyroidism Sister  Arthritis Brother     Social History   Tobacco Use   Smoking status: Former    Packs/day: 1.00    Years: 20.00    Pack years: 20.00    Types: Cigarettes    Quit date: 01/25/2019    Years since quitting: 2.5   Smokeless tobacco: Never  Vaping Use   Vaping Use: Never used  Substance Use Topics   Alcohol use: No   Drug use: Never    Home Medications Prior to Admission medications   Medication Sig Start Date End Date Taking? Authorizing Provider  acetaminophen (TYLENOL) 500 MG tablet Take 1,000 mg by mouth every 6 (six) hours as needed for moderate pain or headache.   Yes  [provider]  aspirin EC 81 MG EC tablet Take 1 tablet (81 mg total) by mouth daily. 01/26/19  Yes Emilie Rutter, PA-C  carvedilol (COREG) 25 MG tablet TAKE 1 TABLET BY MOUTH 2 TIMES DAILY WITH A MEAL. Patient taking differently: Take 25 mg by mouth 2 (two) times daily with a meal. 06/13/21  Yes Tonny Bollman, MD  hydrochlorothiazide (HYDRODIURIL) 12.5 MG tablet Take 1 tablet (12.5 mg total) by mouth daily. For blood pressure 05/22/21  Yes Doreene Nest, NP  rosuvastatin (CRESTOR) 10 MG tablet Take 1 tablet (10 mg total) by mouth daily. For cholesterol. Patient taking differently: Take 10 mg by mouth at bedtime. 02/21/21  Yes Doreene Nest, NP    Allergies    Penicillins  Review of Systems   Review of Systems  Constitutional:  Negative for chills and fever.  HENT:  Negative for sore throat.   Respiratory:  Negative for shortness of breath.   Cardiovascular:  Negative for chest pain.  Gastrointestinal:  Positive for abdominal pain and nausea. Negative for blood in stool, diarrhea and vomiting.  Genitourinary:  Negative for flank pain.  Musculoskeletal:  Negative for back pain.  Neurological:  Positive for headaches. Negative for light-headedness.  All other systems reviewed and are negative.  Physical Exam Updated Vital Signs BP (!) 112/51   Pulse 61   Temp 98.3 F (36.8 C) (Oral)   Resp 20   Ht 5\' 6"  (1.676 m)   Wt 65.3 kg   SpO2 96%   BMI 23.24 kg/m   Physical Exam Vitals and nursing note reviewed.  Constitutional:      Appearance: She is well-developed.  HENT:     Head: Normocephalic and atraumatic.  Cardiovascular:     Rate and Rhythm: Normal rate.     Pulses:          Radial pulses are 2+ on the right side and 2+ on the left side.       Popliteal pulses are 2+ on the right side and 2+ on the left side.       Dorsalis pedis pulses are 2+ on the right side and 2+ on the left side.  Pulmonary:     Effort: Pulmonary effort is normal.     Breath  sounds: No wheezing or rales.  Abdominal:     General: Bowel sounds are normal.     Palpations: Abdomen is soft.     Tenderness: There is abdominal tenderness in the periumbilical area. There is no right CVA tenderness or left CVA tenderness.  Skin:    General: Skin is warm and dry.  Neurological:     Mental Status: She is alert and oriented to person, place, and time.    ED Results / Procedures /  Treatments   Labs (all labs ordered are listed, but only abnormal results are displayed) Labs Reviewed  CBC WITH DIFFERENTIAL/PLATELET - Abnormal; Notable for the following components:      Result Value   WBC 3.7 (*)    Lymphs Abs 0.5 (*)    All other components within normal limits  COMPREHENSIVE METABOLIC PANEL - Abnormal; Notable for the following components:   Sodium 131 (*)    Potassium 3.4 (*)    CO2 21 (*)    Glucose, Bld 109 (*)    Creatinine, Ser 1.56 (*)    Calcium 8.6 (*)    Albumin 3.4 (*)    AST 68 (*)    ALT 64 (*)    GFR, Estimated 38 (*)    All other components within normal limits  I-STAT CHEM 8, ED - Abnormal; Notable for the following components:   Sodium 134 (*)    Potassium 3.4 (*)    Creatinine, Ser 1.70 (*)    Glucose, Bld 106 (*)    Calcium, Ion 1.11 (*)    All other components within normal limits  RESP PANEL BY RT-PCR (FLU A&B, COVID) ARPGX2  LIPASE, BLOOD  POC SARS CORONAVIRUS 2 AG -  ED    EKG None  Radiology CT Angio Chest/Abd/Pel for Dissection W and/or Wo Contrast  Result Date: 08/27/2021 CLINICAL DATA:  Lower abdominal and pelvic pain, history of type B aortic dissection status post endovascular repair EXAM: CT ANGIOGRAPHY CHEST, ABDOMEN AND PELVIS TECHNIQUE: Non-contrast CT of the chest was initially obtained. Multidetector CT imaging through the chest, abdomen and pelvis was performed using the standard protocol during bolus administration of intravenous contrast. Multiplanar reconstructed images and MIPs were obtained and reviewed to  evaluate the vascular anatomy. CONTRAST:  80mL OMNIPAQUE IOHEXOL 350 MG/ML SOLN COMPARISON:  06/12/2021 FINDINGS: CTA CHEST FINDINGS Cardiovascular: Stable appearance of the endoluminal stent graft involving the distal aortic arch through the diaphragmatic hiatus. No evidence of thoracic aortic aneurysm or acute dissection. Stable atheromatous plaque at the origin of the left subclavian artery, without critical stenosis. Heart is unremarkable without pericardial effusion. Timing of the contrast bolus limits evaluation of the pulmonary vasculature. Mediastinum/Nodes: No enlarged mediastinal, hilar, or axillary lymph nodes. Thyroid gland, trachea, and esophagus demonstrate no significant findings. Lungs/Pleura: No acute airspace disease, effusion, or pneumothorax. Central airways are widely patent. Musculoskeletal: No acute or destructive bony lesions. Reconstructed images demonstrate no additional findings. Review of the MIP images confirms the above findings. CTA ABDOMEN AND PELVIS FINDINGS VASCULAR Aorta: Chronic abdominal aortic dissection again identified, without appreciable change. At the level of the fenestration within the proximal abdominal aorta at the level of the diaphragmatic hiatus, the aorta measures 3.6 x 3.1 cm, previously measuring 3.6 x 2.9 cm. At this level of the fenestration is seen between the true and false lumen. No evidence of critical stenosis. Celiac: Patent, arising from the true lumen.  No critical stenosis. SMA: Patent, arising from the true lumen.  No critical stenosis. 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 The main renal arteries arise in the expected location off the true lumen, without critical stenosis. There are accessory renal arteries supplying the lower poles of the bilateral kidneys, rising inferior to the level of the IMA. On the right, there are 2 accessory arteries arising from the false lumen. On the left, there is a single accessory artery arising from  the true lumen. IMA: The IMA is patent, with origin off of the true lumen. There  is marked narrowing of the origin of the IMA, greater than 90%, but stable. Inflow: Dissection extends into the common iliac arteries, unchanged. Stable aneurysmal dilation of the right common iliac artery measuring 1.9 cm. No significant stenosis. Veins: No obvious venous abnormality within the limitations of this arterial phase study. Review of the MIP images confirms the above findings. NON-VASCULAR Hepatobiliary: No focal liver abnormality is seen. No gallstones, gallbladder wall thickening, or biliary dilatation. Pancreas: Unremarkable. No pancreatic ductal dilatation or surrounding inflammatory changes. Spleen: Normal in size without focal abnormality. Adrenals/Urinary Tract: Adrenal glands are unremarkable. Kidneys are normal, without renal calculi, focal lesion, or hydronephrosis. Bladder is unremarkable. Stomach/Bowel: No bowel obstruction or ileus. Normal appendix right lower quadrant. No bowel wall thickening or inflammatory change. Lymphatic: No pathologic adenopathy. Reproductive: Uterus and bilateral adnexa are unremarkable. Other: No free fluid or free gas.  No abdominal wall hernia. Musculoskeletal: No acute or destructive bony lesions. Reconstructed images demonstrate no additional findings. Review of the MIP images confirms the above findings. IMPRESSION: 1. Stable appearance of the type B aortic dissection, with stable endoluminal stent graft extending from the distal aortic arch through the diaphragmatic hiatus. 2. Stable aneurysmal dilation of the proximal abdominal aorta at the diaphragmatic hiatus, measuring up to 3.6 cm. Fenestration between the true and false lumen noted at this level, unchanged. 3. No acute intrathoracic, intra-abdominal, or intrapelvic process. No explanation for the patient's reported lower abdominal and pelvic pain. Electronically Signed   By: Sharlet Salina M.D.   On: 08/27/2021 22:27     Procedures Procedures   Medications Ordered in ED Medications  sodium chloride 0.9 % bolus 1,000 mL (has no administration in time range)  morphine 4 MG/ML injection 4 mg (4 mg Intravenous Given 08/27/21 2048)  ondansetron (ZOFRAN) injection 4 mg (4 mg Intravenous Given 08/27/21 2049)  iohexol (OMNIPAQUE) 350 MG/ML injection 80 mL (80 mLs Intravenous Contrast Given 08/27/21 2210)    ED Course  I have reviewed the triage vital signs and the nursing notes.  Pertinent labs & imaging results that were available during my care of the patient were reviewed by me and considered in my medical decision making (see chart for details).  Clinical Course as of 08/28/21 0015  Mon Aug 27, 2021  2348 Glori Luis): MODERATE [JS]  Tue Aug 28, 2021  0000 Hgb urine dipstick(!): LARGE [JS]  0015 WBC, UA: >50 [JS]    Clinical Course User Index [JS] Claude Manges, PA-C   MDM Rules/Calculators/A&P  Patient presents to the ED with a chief complaint of shearing pain along the periumbilical area with radiation to her pelvis, similar symptoms in the past during her prior thoracic aneurysm which was repaired by Dr. Darrick Penna.  No relief with over-the-counter medication.  During evaluation she has symmetric pulses to bilateral lower extremities, bilateral upper extremities.  Prior history of ICA dissection repair by Dr. Jettie Booze to vascular surgery, proximately 2 years ago in the month of June.  According to his chart and records, looks like patient had poor perfusion to the mesenteric, renal arteries, iliacs.  Her lungs are clear to auscultation, moves upper and lower extremities.  Significant tenderness to palpation along the periumbilical area and radiation to the pelvis.  No CVA tenderness, no urinary symptoms.  Reports normal bowel movements at home.  Pressure is slightly abated per son at the bedside who reports a systolic of 120 since usually elevated for her.  She is currently on BP meds for control.  On  carvedilol  25 mg twice daily.  Labs along with CT angio to rule out dissection have been ordered.  CT Angio chest/abd/pelv:  IMPRESSION:  1. Stable appearance of the type B aortic dissection, with stable  endoluminal stent graft extending from the distal aortic arch  through the diaphragmatic hiatus.  2. Stable aneurysmal dilation of the proximal abdominal aorta at the  diaphragmatic hiatus, measuring up to 3.6 cm. Fenestration between  the true and false lumen noted at this level, unchanged.  3. No acute intrathoracic, intra-abdominal, or intrapelvic process.  No explanation for the patient's reported lower abdominal and pelvic  pain.      11:30 PM patient reevaluated by me, does report mild improvement in pain at this time.  Has been voiding without any urinary symptoms, but we did not collect a UA.  Discussed the results of her CT with patient at length.  UA with Many bacteria, greater than 50 white blood cell count, leukocytes.  I have discussed these results with patient.  She will be sent home on a short course of Keflex.  Patient is awaiting influenza panel.  Patient care signed out pending influenza panel and reassessment.    Portions of this note were generated with Scientist, clinical (histocompatibility and immunogenetics). Dictation errors may occur despite best attempts at proofreading.  Final Clinical Impression(s) / ED Diagnoses Final diagnoses:  Periumbilical abdominal pain    Rx / DC Orders ED Discharge Orders     None        Claude Manges, PA-C 08/28/21 0018    Linwood Dibbles, MD 08/28/21 [phone]

## 2021-08-27 NOTE — ED Triage Notes (Signed)
Pt presents to the ED from home with complaints of low abd pain, headache onset  last night. Pt denies N/V/D, cough, congestion. Denies urinary symptoms.

## 2021-08-27 NOTE — ED Provider Notes (Signed)
Emergency Medicine Provider Triage Evaluation Note  Heather Pollard , a 60 y.o. female  was evaluated in triage.  Pt complains of abdominal pain since last night.  Patient does have a history of thoracic dissection.  She states that this feels similar to that episode.  She describes her abdominal pain as a tearing/ripping sensation.  Also having a headache.  She denies nausea, vomiting, diarrhea, cough, congestion.  No urinary symptoms.  Review of Systems  Positive:  Negative: See above   Physical Exam  BP (!) 107/49 (BP Location: Left Arm)   Pulse 63   Temp 98.3 F (36.8 C) (Oral)   Resp 20   Ht 5\' 6"  (1.676 m)   Wt 65.3 kg   SpO2 97%   BMI 23.24 kg/m  Gen:   Awake, no distress   Resp:  Normal effort  MSK:   Moves extremities without difficulty  Other:  Moderate abdominal tenderness diffusely.  Medical Decision Making  Medically screening exam initiated at 7:58 PM.  Appropriate orders placed.  [name] was informed that the remainder of the evaluation will be completed by another provider, this initial triage assessment does not replace that evaluation, and the importance of remaining in the ED until their evaluation is complete.     [name], PA-C 08/27/21 2000    [place], 14/12/22, MD 08/27/21 [phone]

## 2021-08-28 LAB — URINALYSIS, ROUTINE W REFLEX MICROSCOPIC
Bilirubin Urine: NEGATIVE
Glucose, UA: NEGATIVE mg/dL
Ketones, ur: NEGATIVE mg/dL
Nitrite: NEGATIVE
Protein, ur: NEGATIVE mg/dL
Specific Gravity, Urine: 1.01 (ref 1.005–1.030)
pH: 6 (ref 5.0–8.0)

## 2021-08-28 LAB — RESP PANEL BY RT-PCR (FLU A&B, COVID) ARPGX2
Influenza A by PCR: NEGATIVE
Influenza B by PCR: NEGATIVE
SARS Coronavirus 2 by RT PCR: POSITIVE — AB

## 2021-08-28 LAB — URINALYSIS, MICROSCOPIC (REFLEX)
RBC / HPF: >50 RBC/hpf (ref 0–5)
WBC, UA: >50 WBC/hpf (ref 0–5)

## 2021-08-28 MED ORDER — CEPHALEXIN 500 MG PO CAPS: 500.0000 mg | ORAL_CAPSULE | Freq: Two times a day (BID) | ORAL | 0 refills | Status: AC

## 2021-08-28 NOTE — ED Notes (Signed)
Discharge instructions reviewed and explained, pt verbalized understanding.  ?

## 2021-08-28 NOTE — Discharge Instructions (Addendum)
We discussed the results of your CT imaging at length.  I also discussed the results of your labs.Your urinalysis showed infection.  I have prescribed antibiotics for her to treat this infection, please take 1 tablet twice a day for the next 7 days.  Please schedule an appointment with your vascular surgeon or to further evaluate your symptoms.  If you experience any nausea, vomiting, worsening symptoms please return to the emergency department.

## 2021-08-29 LAB — URINE CULTURE

## 2021-11-30 ENCOUNTER — Other Ambulatory Visit: Payer: Self-pay | Admitting: Cardiovascular Disease

## 2021-12-11 ENCOUNTER — Other Ambulatory Visit: Payer: Self-pay

## 2021-12-11 ENCOUNTER — Ambulatory Visit: Payer: BC Managed Care – PPO | Admitting: Cardiovascular Disease

## 2021-12-11 ENCOUNTER — Encounter: Payer: Self-pay | Admitting: Cardiovascular Disease

## 2021-12-11 VITALS — BP 140/90 | HR 58 | Ht 65.5 in | Wt 153.2 lb

## 2021-12-11 DIAGNOSIS — Z9889 Other specified postprocedural states: Secondary | ICD-10-CM

## 2021-12-11 DIAGNOSIS — R0789 Other chest pain: Secondary | ICD-10-CM

## 2021-12-11 DIAGNOSIS — I10 Essential (primary) hypertension: Secondary | ICD-10-CM | POA: Diagnosis not present

## 2021-12-11 MED ORDER — AMLODIPINE BESYLATE 5 MG PO TABS
5.0000 mg | ORAL_TABLET | Freq: Every day | ORAL | 3 refills | Status: DC
Start: 2021-12-11 — End: 2022-11-06

## 2021-12-11 MED ORDER — CARVEDILOL 25 MG PO TABS: 25.0000 mg | ORAL_TABLET | Freq: Two times a day (BID) | ORAL | 3 refills | Status: DC

## 2021-12-11 NOTE — Progress Notes (Signed)
?Cardiology Office Note:   ? ?Date:  12/11/2021  ? ?ID:  Heather Pollard, DOB [birth year], MRN [id number] ? ?PCP:  Doreene Nest, NP ?  ?[facility] Providers ?Cardiologist:  Tonny Bollman, MD    ? ?Referring MD: Doreene Nest, NP  ? ?Chief Complaint  ?Patient presents with  ? Chest Pain  ? ? ?History of Present Illness:   ? ?Heather Pollard is a 61 y.o. female with a hx of type B aortic dissection in 2020, hypertension, and history of tobacco abuse, presenting for follow-up evaluation today.  The patient underwent TEVAR for treatment of her extensive aortic dissection.  She has been followed by vascular surgery since that time, now with Dr. Lenell Antu in Dr. Darrick Penna retirement.  She was initially treated with amlodipine and carvedilol but developed leg swelling on the 10 mg dose of amlodipine.  She was then switched to hydrochlorothiazide.  She reports some issues with episodic pain in her left upper back that come on suddenly and feel like a sharp pain.  The symptoms have been self-limited.  They are not consistently related to physical exertion but she has concerns about whether this could be heart related.  She also has some pains with taking deep breaths at times.  She denies orthopnea or PND.  She has had no further problems with leg swelling.  She has no other complaints today. ? ?Past Medical History:  ?Diagnosis Date  ? AAA (abdominal aortic aneurysm)   ? Ectopic pregnancy   ? HTN (hypertension)   ? Hypertensive emergency   ? Thoracic aortic aneurysm 02/2019  ? Tubal pregnancy   ? ? ?Past Surgical History:  ?Procedure Laterality Date  ? BREAST SURGERY    ? ECTOPIC PREGNANCY SURGERY    ? PARTIAL HYSTERECTOMY    ? THORACIC AORTIC ENDOVASCULAR STENT GRAFT N/A 02/22/2019  ? Procedure: THORACIC AORTIC ENDOVASCULAR STENT GRAFT;  Surgeon: Sherren Kerns, MD;  Location: [facility] OR;  Service: Vascular;  Laterality: N/A;  ? ULTRASOUND GUIDANCE FOR VASCULAR ACCESS Right 02/22/2019  ? Procedure: Ultrasound  Guidance For Vascular Access;  Surgeon: Sherren Kerns, MD;  Location: [facility] OR;  Service: Vascular;  Laterality: Right;  ? ? ?Current Medications: ?Current Meds  ?Medication Sig  ? acetaminophen (TYLENOL) 500 MG tablet Take 1,000 mg by mouth every 6 (six) hours as needed for moderate pain or headache.  ? amLODipine (NORVASC) 5 MG tablet Take 1 tablet (5 mg total) by mouth daily.  ? aspirin EC 81 MG EC tablet Take 1 tablet (81 mg total) by mouth daily.  ? hydrochlorothiazide (HYDRODIURIL) 12.5 MG tablet Take 1 tablet (12.5 mg total) by mouth daily. For blood pressure  ? rosuvastatin (CRESTOR) 10 MG tablet Take 1 tablet (10 mg total) by mouth daily. For cholesterol. (Patient taking differently: Take 10 mg by mouth at bedtime.)  ? [DISCONTINUED] carvedilol (COREG) 25 MG tablet TAKE 1 TABLET BY MOUTH 2 TIMES DAILY WITH A MEAL.  ?  ? ?Allergies:   Penicillins  ? ?Social History  ? ?Socioeconomic History  ? Marital status: Married  ?  Spouse name: Not on file  ? Number of children: Not on file  ? Years of education: Not on file  ? Highest education level: Not on file  ?Occupational History  ? Not on file  ?Tobacco Use  ? Smoking status: Former  ?  Packs/day: 1.00  ?  Years: 20.00  ?  Pack years: 20.00  ?  Types: Cigarettes  ?  Quit date: 01/25/2019  ?  Years since quitting: 2.8  ? Smokeless tobacco: Never  ?Vaping Use  ? Vaping Use: Never used  ?Substance and Sexual Activity  ? Alcohol use: No  ? Drug use: Never  ? Sexual activity: Not on file  ?Other Topics Concern  ? Not on file  ?Social History Narrative  ? Not on file  ? ?Social Determinants of Health  ? ?Financial Resource Strain: Not on file  ?Food Insecurity: Not on file  ?Transportation Needs: Not on file  ?Physical Activity: Not on file  ?Stress: Not on file  ?Social Connections: Not on file  ?  ? ?Family History: ?The patient's family history includes Alzheimer's disease in her father; Arthritis in her brother; Cancer in her mother; Heart disease in her mother;  Hypothyroidism in her mother, sister, sister, and sister; Stroke in her father; Thyroid cancer in her sister; Uterine cancer in her mother. ? ?ROS:   ?Please see the history of present illness.    ?All other systems reviewed and are negative. ? ?EKGs/Labs/Other Studies Reviewed:   ? ?The following studies were reviewed today: ?2D echocardiogram 01/20/2019: ? 1. The left ventricle has normal systolic function, with an ejection  ?fraction of 60-65%. The cavity size was normal. There is mildly increased  ?left ventricular wall thickness. No evidence of left ventricular regional  ?wall motion abnormalities.  ? 2. The right ventricle has normal systolc function. The cavity was  ?normal. There is no increase in right ventricular wall thickness.  ? 3. Left atrial size was mildly dilated.  ? 4. Trivial pericardial effusion is present.  ? 5. No evidence of mitral valve stenosis. Trivial mitral regurgitation.  ? 6. The aortic valve is tricuspid Mild calcification of the aortic valve.  ?No stenosis of the aortic valve.  ? 7. The aortic root is normal in size and structure.  ? 8. The IVC was normal in size. No complete TR doppler jet so unable to  ?estimate PA systolic pressure.  ? ?EKG:  EKG is not ordered today.   ? ?Recent Labs: ?01/26/2021: TSH 2.24 ?08/27/2021: ALT 64; BUN 19; Creatinine, Ser 1.70; Hemoglobin 13.3; Platelets 169; Potassium 3.4; Sodium 134  ?Recent Lipid Panel ?   ?Component Value Date/Time  ? CHOL 166 05/22/2021 0845  ? TRIG 101.0 05/22/2021 0845  ? HDL 52.20 05/22/2021 0845  ? CHOLHDL 3 05/22/2021 0845  ? VLDL 20.2 05/22/2021 0845  ? LDLCALC 93 05/22/2021 0845  ? ? ? ?Risk Assessment/Calculations:   ?  ? ?    ? ?Physical Exam:   ? ?VS:  BP 140/90 Comment: right arm 140/90; 130/79 left arm dynamap  Pulse (!) 58   Ht 5' 5.5" (1.664 m)   Wt 153 lb 3.2 oz (69.5 kg)   SpO2 97%   BMI 25.11 kg/m?    ? ?Wt Readings from Last 3 Encounters:  ?12/11/21 153 lb 3.2 oz (69.5 kg)  ?08/27/21 144 lb (65.3 kg)  ?07/03/21  151 lb (68.5 kg)  ?  ? ?GEN:  Well nourished, well developed in no acute distress ?HEENT: Normal ?NECK: No JVD; No carotid bruits ?LYMPHATICS: No lymphadenopathy ?CARDIAC: RRR, no murmurs, rubs, gallops ?RESPIRATORY:  Clear to auscultation without rales, wheezing or rhonchi  ?ABDOMEN: Soft, non-tender, non-distended ?MUSCULOSKELETAL:  No edema; No deformity  ?SKIN: Warm and dry ?NEUROLOGIC:  Alert and oriented x 3 ?PSYCHIATRIC:  Normal affect  ? ?ASSESSMENT:   ? ?1. S/P aortic dissection repair   ?2. Essential hypertension   ?  3. Atypical chest pain   ? ?PLAN:   ? ?In order of problems listed above: ? ?Reviewed CTA studies and vascular surgery notes.  Important to control her blood pressure.  See below for further discussion.  The patient has quit smoking.  She is very compliant with her medications and doing her best to take good care of herself. ?I think her blood pressure control is suboptimal.  She continues on carvedilol and hydrochlorothiazide.  These medications will be continued.  I will add amlodipine at a lower dose of 5 mg daily.  It seems like she tolerated this okay before.  States that she did not have swelling until she was on amlodipine for over a year.  If this does not agree with her, we will need to switch her to an ACE inhibitor or ARB.  We will repeat a metabolic panel since she has exhibited elevated creatinine and hypokalemia in the past. ?I have recommended a Lexiscan Myoview stress test for further risk stratification.  Her chest pain symptoms are atypical, but she has a high risk profile with her history of type B aortic dissection, hypertension, and history of tobacco use. ? ?   ? ?Shared Decision Making/Informed Consent ?The risks [chest pain, shortness of breath, cardiac arrhythmias, dizziness, blood pressure fluctuations, myocardial infarction, stroke/transient ischemic attack, nausea, vomiting, allergic reaction, radiation exposure, metallic taste sensation and life-threatening  complications (estimated to be 1 in 10,000)], benefits (risk stratification, diagnosing coronary artery disease, treatment guidance) and alternatives of a nuclear stress test were discussed in detail with Heather Pollard

## 2021-12-11 NOTE — Patient Instructions (Signed)
Medication Instructions:  ?START Amlodipine 5mg  daily ?*If you need a refill on your cardiac medications before your next appointment, please call your pharmacy* ? ? ?Lab Work: ?BMET ?If you have labs (blood work) drawn today and your tests are completely normal, you will receive your results only by: ?MyChart Message (if you have MyChart) OR ?A paper copy in the mail ?If you have any lab test that is abnormal or we need to change your treatment, we will call you to review the results. ? ? ?Testing/Procedures: ?Lexiscan Myoview Stress test ?Your physician has requested that you have a lexiscan myoview. For further information please visit . Please follow instruction sheet, as given. ? ?Follow-Up: ?At [facility], you and your health needs are our priority.  As part of our continuing mission to provide you with exceptional heart care, we have created designated Provider Care Teams.  These Care Teams include your primary Cardiologist (physician) and Advanced Practice Providers (APPs -  Physician Assistants and Nurse Practitioners) who all work together to provide you with the care you need, when you need it.   ? ?Your next appointment:   ?1 year(s) ? ?The format for your next appointment:   ?In Person ? ?Provider:   ?[facility], MD   ? ?  ?

## 2021-12-12 LAB — BASIC METABOLIC PANEL
BUN/Creatinine Ratio: 15 (ref 12–28)
BUN: 16 mg/dL (ref 8–27)
CO2: 24 mmol/L (ref 20–29)
Calcium: 9.4 mg/dL (ref 8.7–10.3)
Chloride: 100 mmol/L (ref 96–106)
Creatinine, Ser: 1.07 mg/dL — ABNORMAL HIGH (ref 0.57–1.00)
Glucose: 92 mg/dL (ref 70–99)
Potassium: 4.2 mmol/L (ref 3.5–5.2)
Sodium: 137 mmol/L (ref 134–144)
eGFR: 59 mL/min/{1.73_m2} — ABNORMAL LOW (ref >59)

## 2021-12-24 ENCOUNTER — Telehealth (HOSPITAL_COMMUNITY): Payer: Self-pay | Admitting: *Deleted

## 2021-12-24 NOTE — Telephone Encounter (Signed)
Patient given detailed instructions per Myocardial Perfusion Study Information Sheet for the test on 12/31/21 at 7:45. Patient notified to arrive 15 minutes early and that it is imperative to arrive on time for appointment to keep from having the test rescheduled. ? If you need to cancel or reschedule your appointment, please call the office within 24 hours of your appointment. . Patient verbalized understanding.Nelson Chimes S ? ? ?

## 2021-12-28 ENCOUNTER — Telehealth: Payer: Self-pay | Admitting: Cardiovascular Disease

## 2021-12-28 ENCOUNTER — Ambulatory Visit
Admission: RE | Admit: 2021-12-28 | Discharge: 2021-12-28 | Disposition: A | Discharge status: Home / Self Care | Payer: BC Managed Care – PPO | Source: Ambulatory Visit | Attending: Primary Care | Admitting: Primary Care

## 2021-12-28 DIAGNOSIS — R928 Other abnormal and inconclusive findings on diagnostic imaging of breast: Secondary | ICD-10-CM

## 2021-12-28 DIAGNOSIS — R921 Mammographic calcification found on diagnostic imaging of breast: Secondary | ICD-10-CM | POA: Diagnosis not present

## 2021-12-28 NOTE — Telephone Encounter (Signed)
Pt states that her appt for Monday 4/17 was cancelled for her Myocardio Perfusion and she wants to know why. Please advise ?

## 2021-12-31 ENCOUNTER — Encounter (HOSPITAL_COMMUNITY): Payer: Self-pay

## 2021-12-31 ENCOUNTER — Encounter (HOSPITAL_COMMUNITY): Payer: BC Managed Care – PPO

## 2022-01-09 ENCOUNTER — Other Ambulatory Visit: Payer: Self-pay | Admitting: Primary Care

## 2022-01-09 DIAGNOSIS — E785 Hyperlipidemia, unspecified: Secondary | ICD-10-CM

## 2022-01-09 MED ORDER — ROSUVASTATIN CALCIUM 10 MG PO TABS: 10.0000 mg | ORAL_TABLET | Freq: Every day | ORAL | 0 refills | Status: DC

## 2022-01-09 NOTE — Telephone Encounter (Signed)
Last office visit 05/22/21 ?See lab results; needed to repeat lab work ?Patient has had some labs at another facility ?Last refill 02/21/21 #90/3 ?

## 2022-01-09 NOTE — Telephone Encounter (Signed)
Forwarding to Mead  to advise  ?

## 2022-01-09 NOTE — Telephone Encounter (Signed)
Caller Name: Heather Pollard ?Call back phone #: [phone] ? ?MEDICATION(S): Rosuvastapin ? ? ? ? ?~~~Please advise patient/caregiver to allow 2-3 business days to process RX refills.  ?

## 2022-01-22 ENCOUNTER — Telehealth (HOSPITAL_COMMUNITY): Payer: Self-pay | Admitting: *Deleted

## 2022-01-22 NOTE — Telephone Encounter (Signed)
Patient given detailed instructions per Myocardial Perfusion Study Information Sheet for the test on 01/28/2022 at 8:00. Patient notified to arrive 15 minutes early and that it is imperative to arrive on time for appointment to keep from having the test rescheduled. ? If you need to cancel or reschedule your appointment, please call the office within 24 hours of your appointment. . Patient verbalized understanding.Nelson Chimes S ? ? ?

## 2022-01-28 ENCOUNTER — Ambulatory Visit (HOSPITAL_COMMUNITY): Payer: BC Managed Care – PPO | Attending: Cardiology

## 2022-01-28 DIAGNOSIS — I10 Essential (primary) hypertension: Secondary | ICD-10-CM | POA: Diagnosis not present

## 2022-01-28 DIAGNOSIS — R0789 Other chest pain: Secondary | ICD-10-CM | POA: Diagnosis not present

## 2022-01-28 DIAGNOSIS — Z9889 Other specified postprocedural states: Secondary | ICD-10-CM | POA: Insufficient documentation

## 2022-01-28 LAB — MYOCARDIAL PERFUSION IMAGING
Base ST Depression (mm): 0 mm
LV dias vol: 68 mL (ref 46–106)
LV sys vol: 20 mL
Nuc Stress EF: 70 %
Peak HR: 82 {beats}/min
Rest HR: 54 {beats}/min
Rest Nuclear Isotope Dose: 10.5 mCi
SDS: 3
SRS: 2
SSS: 5
ST Depression (mm): 0 mm
Stress Nuclear Isotope Dose: 31.6 mCi
TID: 1.03

## 2022-01-28 MED ORDER — TECHNETIUM TC 99M TETROFOSMIN IV KIT
10.5000 | PACK | Freq: Once | INTRAVENOUS | Status: AC | PRN
  Administered 2022-01-28: 10.5 via INTRAVENOUS

## 2022-01-28 MED ORDER — REGADENOSON 0.4 MG/5ML IV SOLN
0.4000 mg | Freq: Once | INTRAVENOUS | Status: AC
  Administered 2022-01-28: 0.4 mg via INTRAVENOUS

## 2022-01-28 MED ORDER — TECHNETIUM TC 99M TETROFOSMIN IV KIT
31.6000 | PACK | Freq: Once | INTRAVENOUS | Status: AC | PRN
  Administered 2022-01-28: 31.6 via INTRAVENOUS

## 2022-04-28 IMAGING — MG MM DIGITAL SCREENING BILAT W/ TOMO AND CAD
6 of 10 series · 6 of 30 positions shown · non-contrast
Comparison: Previous exam(s).

CLINICAL DATA: Screening.

EXAM:
DIGITAL SCREENING BILATERAL MAMMOGRAM WITH TOMOSYNTHESIS AND CAD
TECHNIQUE: Bilateral screening digital craniocaudal and mediolateral oblique
mammograms were obtained. Bilateral screening digital breast
tomosynthesis was performed. The images were evaluated with
computer-aided detection.

[R MLO synth-2D]
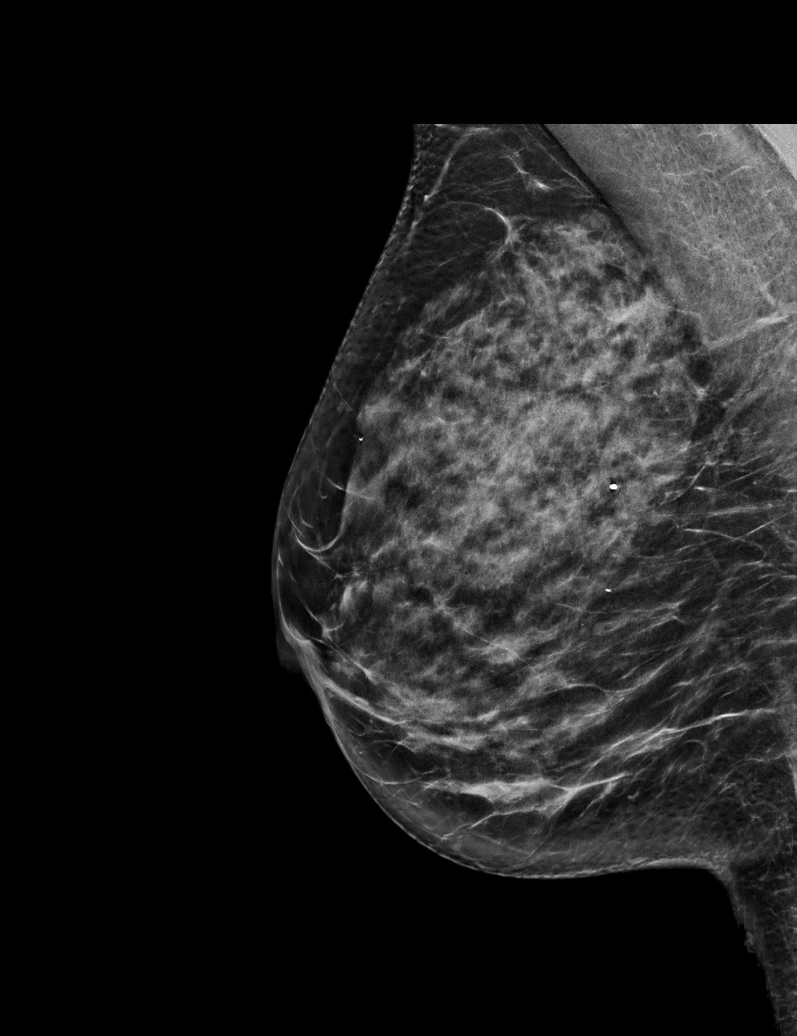

[L CC synth-2D]
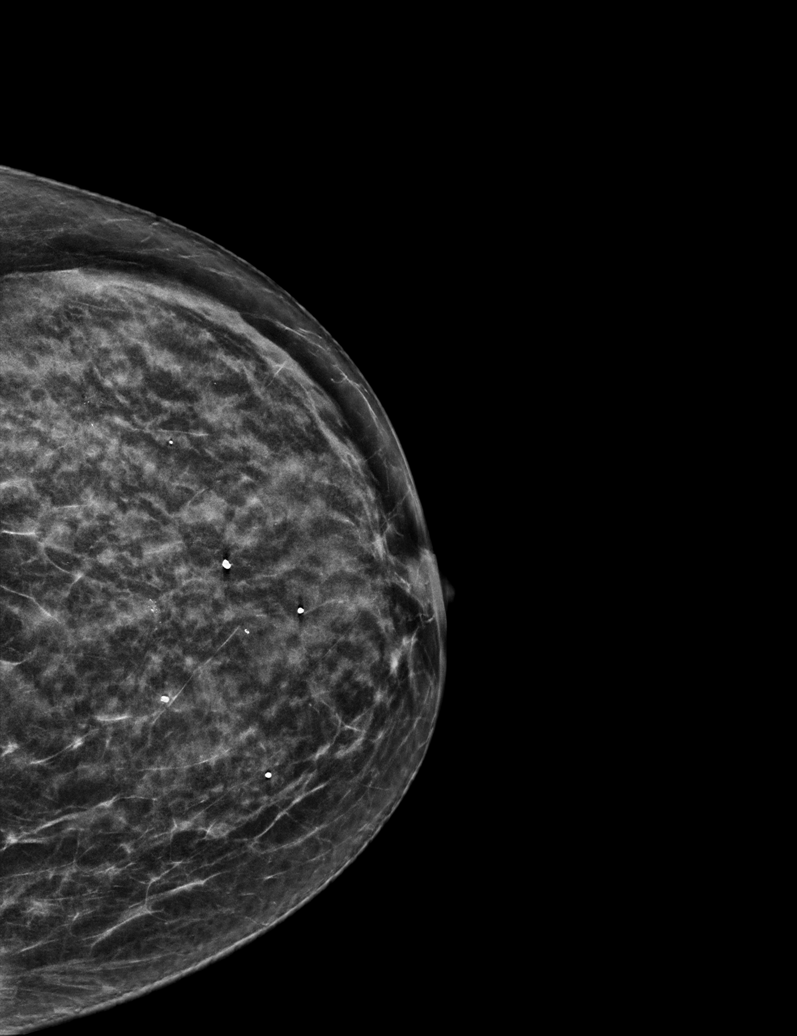

[L MLO synth-2D (1 of 2)]
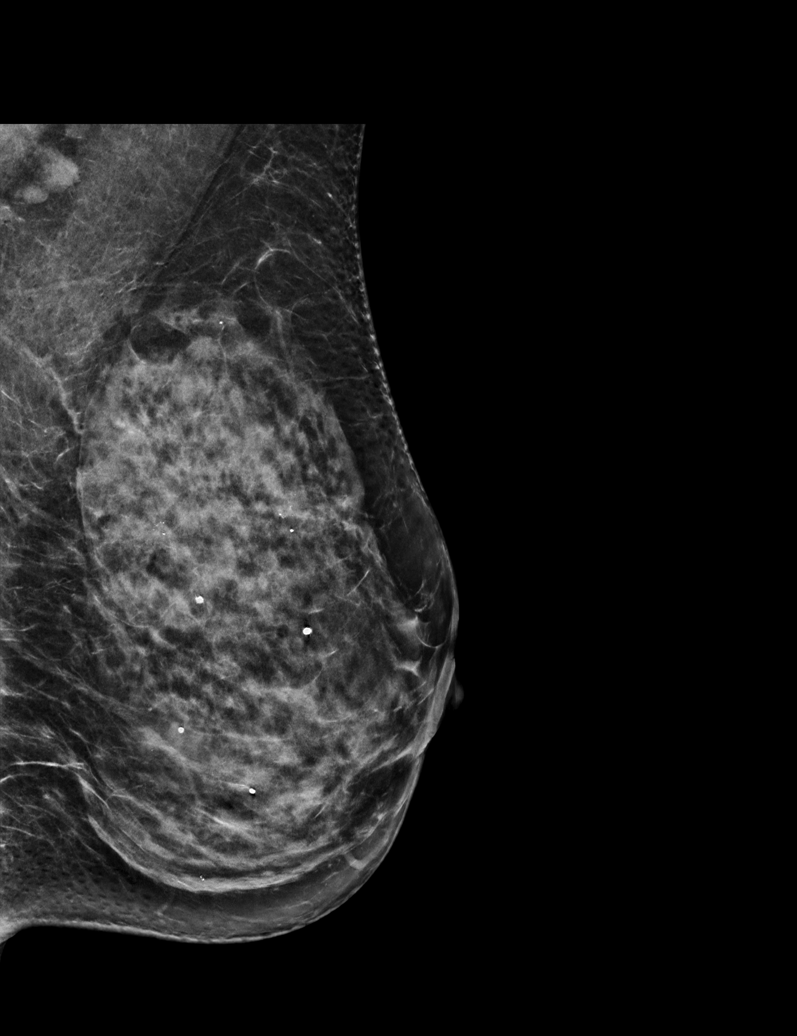

[L MLO synth-2D (2 of 2)]
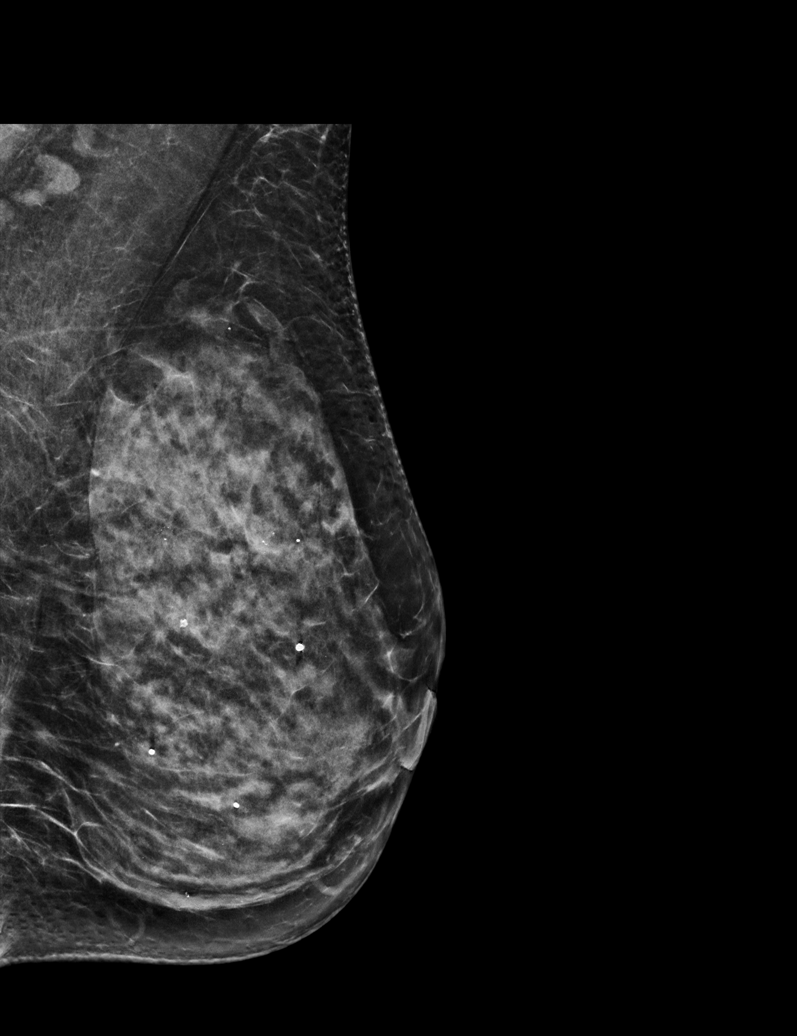

[R CC synth-2D]
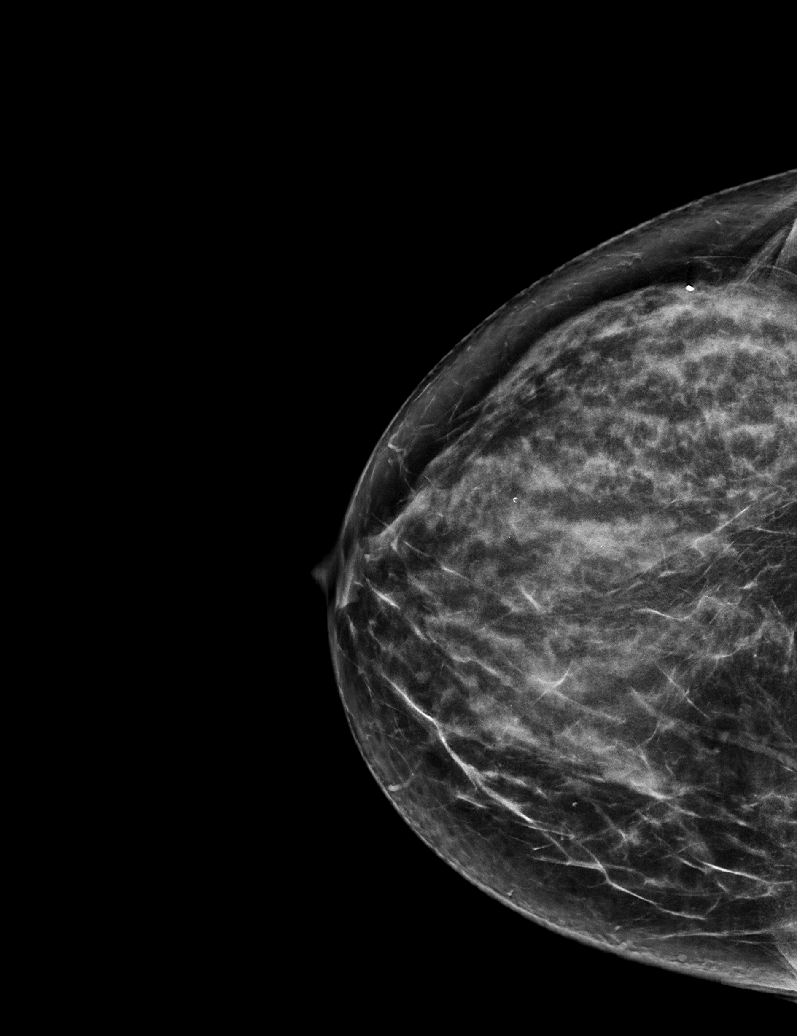

[R MLO tomo · tomo slice 33/65.0]
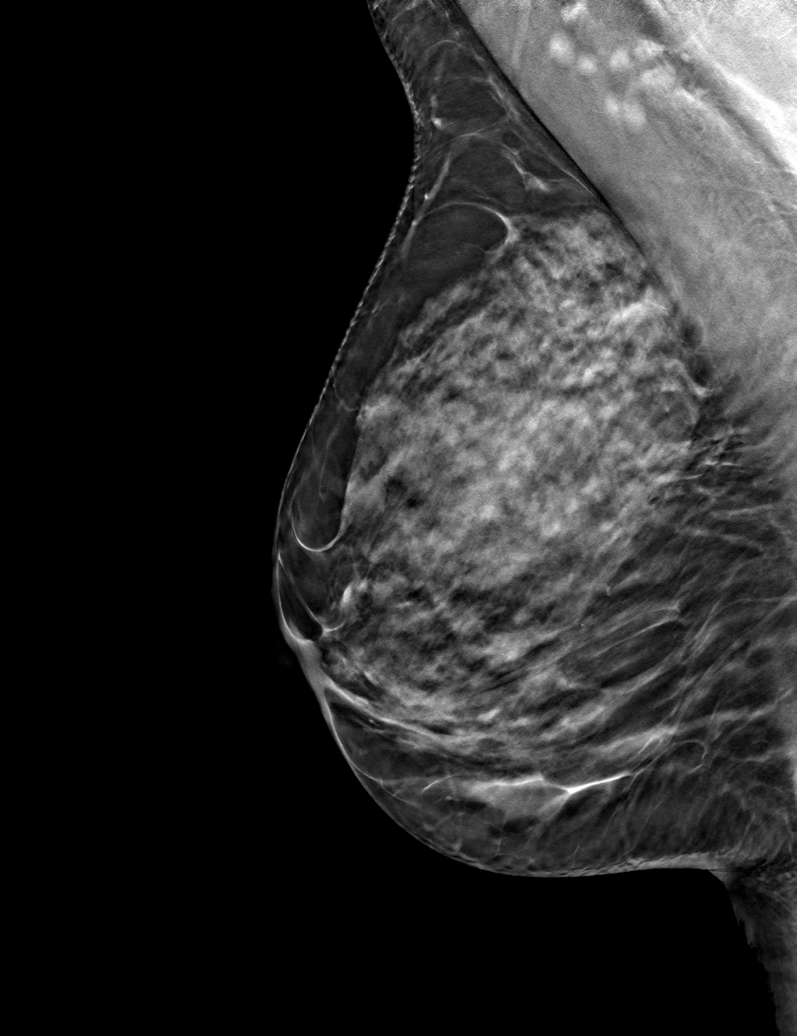

[6 of 30 positions shown; findings below may reference images not displayed]

ACR Breast Density Category c: The breast tissue is heterogeneously
dense, which may obscure small masses.
FINDINGS: In the left breast, calcifications warrant further evaluation. In
the right breast, no findings suspicious for malignancy.
IMPRESSION: Further evaluation is suggested for calcifications in the left
breast.

RECOMMENDATION:
Diagnostic mammogram of the left breast. (Code:AS-B-FFA)

The patient will be contacted regarding the findings, and additional
imaging will be scheduled.

BI-RADS CATEGORY  0: Incomplete. Need additional imaging evaluation
and/or prior mammograms for comparison.

## 2022-05-01 ENCOUNTER — Other Ambulatory Visit: Payer: Self-pay | Admitting: Family Medicine

## 2022-05-01 DIAGNOSIS — E785 Hyperlipidemia, unspecified: Secondary | ICD-10-CM

## 2022-05-02 NOTE — Telephone Encounter (Signed)
Patient is due for CPE/follow up in late September or early October, this will be required prior to any further refills.  Please schedule.

## 2022-05-06 NOTE — Telephone Encounter (Signed)
Patient has been scheduled

## 2022-05-07 NOTE — Telephone Encounter (Signed)
Patient scheduled for 06/07/22.

## 2022-05-27 ENCOUNTER — Other Ambulatory Visit: Payer: Self-pay | Admitting: Primary Care

## 2022-05-27 DIAGNOSIS — I10 Essential (primary) hypertension: Secondary | ICD-10-CM

## 2022-05-27 DIAGNOSIS — E785 Hyperlipidemia, unspecified: Secondary | ICD-10-CM

## 2022-05-30 ENCOUNTER — Other Ambulatory Visit: Payer: Self-pay

## 2022-05-30 DIAGNOSIS — Z8679 Personal history of other diseases of the circulatory system: Secondary | ICD-10-CM

## 2022-05-30 DIAGNOSIS — I71019 Dissection of thoracic aorta, unspecified: Secondary | ICD-10-CM

## 2022-06-05 ENCOUNTER — Telehealth: Payer: Self-pay

## 2022-06-05 NOTE — Telephone Encounter (Signed)
-----   Message from Cathlean Cower sent at 06/05/2022  8:48 AM EDT ----- Regarding: RE: CTA 06/05/22 - left msg on vm ----- Message ----- From: Kaleen Mask, LPN Sent: 03/09/7627   1:51 PM EDT To: April H Pait; Cathlean Cower; # Subject: CTA                                            Please schedule at [facility] prior to appt on 07/09/22.  Thanks!

## 2022-06-07 ENCOUNTER — Other Ambulatory Visit (INDEPENDENT_AMBULATORY_CARE_PROVIDER_SITE_OTHER): Payer: BC Managed Care – PPO

## 2022-06-07 ENCOUNTER — Other Ambulatory Visit: Payer: Self-pay | Admitting: Primary Care

## 2022-06-07 ENCOUNTER — Encounter: Payer: Self-pay | Admitting: Primary Care

## 2022-06-07 ENCOUNTER — Ambulatory Visit (INDEPENDENT_AMBULATORY_CARE_PROVIDER_SITE_OTHER): Payer: BC Managed Care – PPO | Admitting: Primary Care

## 2022-06-07 VITALS — BP 128/94 | HR 60 | Temp 98.2°F | Ht 65.5 in | Wt 151.0 lb

## 2022-06-07 DIAGNOSIS — I71019 Dissection of thoracic aorta, unspecified: Secondary | ICD-10-CM | POA: Diagnosis not present

## 2022-06-07 DIAGNOSIS — R921 Mammographic calcification found on diagnostic imaging of breast: Secondary | ICD-10-CM | POA: Diagnosis not present

## 2022-06-07 DIAGNOSIS — R7303 Prediabetes: Secondary | ICD-10-CM | POA: Diagnosis not present

## 2022-06-07 DIAGNOSIS — E785 Hyperlipidemia, unspecified: Secondary | ICD-10-CM

## 2022-06-07 DIAGNOSIS — Z0000 Encounter for general adult medical examination without abnormal findings: Secondary | ICD-10-CM

## 2022-06-07 DIAGNOSIS — Z23 Encounter for immunization: Secondary | ICD-10-CM | POA: Diagnosis not present

## 2022-06-07 DIAGNOSIS — I10 Essential (primary) hypertension: Secondary | ICD-10-CM | POA: Diagnosis not present

## 2022-06-07 DIAGNOSIS — I711 Thoracic aortic aneurysm, ruptured, unspecified: Secondary | ICD-10-CM

## 2022-06-07 LAB — LIPID PANEL
Cholesterol: 199 mg/dL (ref 0–200)
HDL: 51.4 mg/dL (ref >39.00)
LDL Cholesterol: 122 mg/dL — ABNORMAL HIGH (ref 0–99)
NonHDL: 147.87
Total CHOL/HDL Ratio: 4
Triglycerides: 128 mg/dL (ref 0.0–149.0)
VLDL: 25.6 mg/dL (ref 0.0–40.0)

## 2022-06-07 LAB — COMPREHENSIVE METABOLIC PANEL
ALT: 19 U/L (ref 0–35)
AST: 24 U/L (ref 0–37)
Albumin: 3.9 g/dL (ref 3.5–5.2)
Alkaline Phosphatase: 81 U/L (ref 39–117)
BUN: 22 mg/dL (ref 6–23)
CO2: 29 mEq/L (ref 19–32)
Calcium: 9.4 mg/dL (ref 8.4–10.5)
Chloride: 99 mEq/L (ref 96–112)
Creatinine, Ser: 1.14 mg/dL (ref 0.40–1.20)
GFR: 51.6 mL/min — ABNORMAL LOW (ref >60.00)
Glucose, Bld: 99 mg/dL (ref 70–99)
Potassium: 4.2 mEq/L (ref 3.5–5.1)
Sodium: 135 mEq/L (ref 135–145)
Total Bilirubin: 0.4 mg/dL (ref 0.2–1.2)
Total Protein: 7.5 g/dL (ref 6.0–8.3)

## 2022-06-07 LAB — CBC
HCT: 42 % (ref 36.0–46.0)
Hemoglobin: 14.2 g/dL (ref 12.0–15.0)
MCHC: 33.8 g/dL (ref 30.0–36.0)
MCV: 87.8 fl (ref 78.0–100.0)
Platelets: 220 10*3/uL (ref 150.0–400.0)
RBC: 4.78 Mil/uL (ref 3.87–5.11)
RDW: 14 % (ref 11.5–15.5)
WBC: 6.3 10*3/uL (ref 4.0–10.5)

## 2022-06-07 NOTE — Assessment & Plan Note (Signed)
Continue rosuvastatin 10 mg daily. ?Repeat lipid panel pending. ?

## 2022-06-07 NOTE — Addendum Note (Signed)
Addended by: Pat Kocher on: 06/07/2022 10:46 AM   Modules accepted: Orders

## 2022-06-07 NOTE — Progress Notes (Signed)
Subjective:    Patient ID: Heather Pollard, female    DOB: [birth year], 61 y.o.   MRN: [id number]  HPI  Heather Pollard is a very pleasant 61 y.o. female who presents today for complete physical and follow up of chronic conditions.  Immunizations: -Tetanus: Due  -Influenza: Due today -Shingles: Completed 1 dose of Shingrix  Diet: Fair diet.  Exercise: No regular exercise.  Eye exam: Completes annually  Dental exam: Completes semi-annually   Pap Smear: Completed in 2022 Mammogram: Completed in April 2023, due October 2023 for diagnostic imaging.  Colonoscopy: Never completed, declines today.   BP Readings from Last 3 Encounters:  06/07/22 (!) 128/94  12/11/21 140/90  08/28/21 122/72   She is checking her BP at home which is running low 100's/50's.      Review of Systems  Constitutional:  Negative for unexpected weight change.  HENT:  Negative for rhinorrhea.   Respiratory:  Negative for cough and shortness of breath.   Cardiovascular:  Negative for chest pain.  Gastrointestinal:  Negative for constipation and diarrhea.  Genitourinary:  Negative for difficulty urinating.  Musculoskeletal:  Negative for arthralgias and myalgias.  Skin:  Negative for rash.  Allergic/Immunologic: Negative for environmental allergies.  Neurological:  Negative for dizziness and headaches.  Psychiatric/Behavioral:  The patient is not nervous/anxious.          Past Medical History:  Diagnosis Date   AAA (abdominal aortic aneurysm) (HCC)    Ectopic pregnancy    HTN (hypertension)    Hypertensive emergency    Thoracic aortic aneurysm ([place]) 02/2019   Tubal pregnancy     Social History   Socioeconomic History   Marital status: Married    Spouse name: Not on file   Number of children: Not on file   Years of education: Not on file   Highest education level: Not on file  Occupational History   Not on file  Tobacco Use   Smoking status: Former    Packs/day: 1.00     Years: 20.00    Total pack years: 20.00    Types: Cigarettes    Quit date: 01/25/2019    Years since quitting: 3.3   Smokeless tobacco: Never  Vaping Use   Vaping Use: Never used  Substance and Sexual Activity   Alcohol use: No   Drug use: Never   Sexual activity: Not on file  Other Topics Concern   Not on file  Social History Narrative   Not on file   Social Determinants of Health   Financial Resource Strain: Not on file  Food Insecurity: Not on file  Transportation Needs: Not on file  Physical Activity: Not on file  Stress: Not on file  Social Connections: Not on file  Intimate Partner Violence: Not on file    Past Surgical History:  Procedure Laterality Date   BREAST SURGERY     ECTOPIC PREGNANCY SURGERY     PARTIAL HYSTERECTOMY     THORACIC AORTIC ENDOVASCULAR STENT GRAFT N/A 02/22/2019   Procedure: THORACIC AORTIC ENDOVASCULAR STENT GRAFT;  Surgeon: Elam Dutch, MD;  Location: [facility] OR;  Service: Vascular;  Laterality: N/A;   ULTRASOUND GUIDANCE FOR VASCULAR ACCESS Right 02/22/2019   Procedure: Ultrasound Guidance For Vascular Access;  Surgeon: Elam Dutch, MD;  Location: [facility] OR;  Service: Vascular;  Laterality: Right;    Family History  Problem Relation Age of Onset   Cancer Mother        Peritoneal carcinoma  Heart disease Mother    Uterine cancer Mother    Hypothyroidism Mother    Alzheimer's disease Father    Stroke Father    Hypothyroidism Sister    Thyroid cancer Sister    Hypothyroidism Sister    Hypothyroidism Sister    Arthritis Brother     Allergies  Allergen Reactions   Penicillins Hives and Diarrhea    Did it involve swelling of the face/tongue/throat, SOB, or low BP? No Did it involve sudden or severe rash/hives, skin peeling, or any reaction on the inside of your mouth or nose? No Did you need to seek medical attention at a hospital or doctor's office? No When did it last happen?  less than 10 years      If all above answers are  "NO", may proceed with cephalosporin use.     Current Outpatient Medications on File Prior to Visit  Medication Sig Dispense Refill   acetaminophen (TYLENOL) 500 MG tablet Take 1,000 mg by mouth every 6 (six) hours as needed for moderate pain or headache.     amLODipine (NORVASC) 5 MG tablet Take 1 tablet (5 mg total) by mouth daily. 90 tablet 3   aspirin EC 81 MG EC tablet Take 1 tablet (81 mg total) by mouth daily.     carvedilol (COREG) 25 MG tablet Take 1 tablet (25 mg total) by mouth 2 (two) times daily with a meal. 180 tablet 3   hydrochlorothiazide (HYDRODIURIL) 12.5 MG tablet Take 1 tablet (12.5 mg total) by mouth daily. For blood pressure. Office visit required for further refills. 90 tablet 0   rosuvastatin (CRESTOR) 10 MG tablet Take 1 tablet (10 mg total) by mouth daily. for cholesterol. Office visit required for further refills. 90 tablet 0   No current facility-administered medications on file prior to visit.    BP (!) 128/94   Pulse 60   Temp 98.2 F (36.8 C) (Temporal)   Ht 5' 5.5" (1.664 m)   Wt 151 lb (68.5 kg)   SpO2 99%   BMI 24.75 kg/m  Objective:   Physical Exam HENT:     Right Ear: Tympanic membrane and ear canal normal.     Left Ear: Tympanic membrane and ear canal normal.     Nose: Nose normal.  Eyes:     Conjunctiva/sclera: Conjunctivae normal.     Pupils: Pupils are equal, round, and reactive to light.  Neck:     Thyroid: No thyromegaly.  Cardiovascular:     Rate and Rhythm: Normal rate and regular rhythm.     Heart sounds: No murmur heard. Pulmonary:     Effort: Pulmonary effort is normal.     Breath sounds: Normal breath sounds. No rales.  Abdominal:     General: Bowel sounds are normal.     Palpations: Abdomen is soft.     Tenderness: There is no abdominal tenderness.  Musculoskeletal:        General: Normal range of motion.     Cervical back: Neck supple.  Lymphadenopathy:     Cervical: No cervical adenopathy.  Skin:    General: Skin  is warm and dry.     Findings: No rash.  Neurological:     Mental Status: She is alert and oriented to person, place, and time.     Cranial Nerves: No cranial nerve deficit.     Deep Tendon Reflexes: Reflexes are normal and symmetric.  Psychiatric:        Mood and Affect: Mood  normal.           Assessment & Plan:   Problem List Items Addressed This Visit       Cardiovascular and Mediastinum   Aortic dissection distal to left subclavian [facility])    Following with cardiology and vascular services.  Pending CTA Chest and Abdomen pending for next week.   Reviewed CTA from December 2022.       Thoracic aortic aneurysm [facility])    Following with cardiology and vascular services.  Reviewed cardiology notes from March 2023. Reviewed CTA chest/abdomen from December 2022.  Repeat CTA chest/abdomen pending for next week.      Essential (primary) hypertension    Above goal today, home readings are at goal.   Continue amlodipine 5 mg daily, HCTZ 12.5 mg daily, carvedilol 25 mg BID. CMP pending.      Relevant Orders   Comprehensive metabolic panel   CBC     Other   Hyperlipidemia    Continue rosuvastatin 10 mg daily. Repeat lipid panel pending.      Relevant Orders   Lipid panel   Comprehensive metabolic panel   Preventative health care - Primary    Second Shingrix and influenza vaccines due and provided. Pap smear UTD. Mammogram due in October 2023, orders placed. Colonoscopy overdue, she declines today despite recommendations.  Discussed the importance of a healthy diet and regular exercise in order for weight loss, and to reduce the risk of further co-morbidity.  Exam today stable Labs pending      Prediabetes    Repeat A1C pending.      Relevant Orders   Hemoglobin A1c   Breast calcifications on mammogram    Diagnostic mammogram due in October 2023, discussed with patient. Orders placed.      Relevant Orders   MM DIAG BREAST TOMO BILATERAL        Doreene Nest, NP

## 2022-06-07 NOTE — Assessment & Plan Note (Signed)
Above goal today, home readings are at goal.   Continue amlodipine 5 mg daily, HCTZ 12.5 mg daily, carvedilol 25 mg BID. CMP pending.

## 2022-06-07 NOTE — Assessment & Plan Note (Signed)
Following with cardiology and vascular services.  Reviewed cardiology notes from March 2023. Reviewed CTA chest/abdomen from December 2022.  Repeat CTA chest/abdomen pending for next week.

## 2022-06-07 NOTE — Assessment & Plan Note (Signed)
Following with cardiology and vascular services.  Pending CTA Chest and Abdomen pending for next week.   Reviewed CTA from December 2022.

## 2022-06-07 NOTE — Assessment & Plan Note (Signed)
Repeat A1C pending. 

## 2022-06-07 NOTE — Patient Instructions (Signed)
Stop by the lab prior to leaving today. I will notify you of your results once received.   Notify me if you don't hear from the breast center by mid October.  Please consider the colonoscopy.  It was a pleasure to see you today!  Preventive Care 73-62 Years Old, Female Preventive care refers to lifestyle choices and visits with your health care provider that can promote health and wellness. Preventive care visits are also called wellness exams. What can I expect for my preventive care visit? Counseling Your health care provider may ask you questions about your: Medical history, including: Past medical problems. Family medical history. Pregnancy history. Current health, including: Menstrual cycle. Method of birth control. Emotional well-being. Home life and relationship well-being. Sexual activity and sexual health. Lifestyle, including: Alcohol, nicotine or tobacco, and drug use. Access to firearms. Diet, exercise, and sleep habits. Work and work Statistician. Sunscreen use. Safety issues such as seatbelt and bike helmet use. Physical exam Your health care provider will check your: Height and weight. These may be used to calculate your BMI (body mass index). BMI is a measurement that tells if you are at a healthy weight. Waist circumference. This measures the distance around your waistline. This measurement also tells if you are at a healthy weight and may help predict your risk of certain diseases, such as type 2 diabetes and high blood pressure. Heart rate and blood pressure. Body temperature. Skin for abnormal spots. What immunizations do I need?  Vaccines are usually given at various ages, according to a schedule. Your health care provider will recommend vaccines for you based on your age, medical history, and lifestyle or other factors, such as travel or where you work. What tests do I need? Screening Your health care provider may recommend screening tests for certain  conditions. This may include: Lipid and cholesterol levels. Diabetes screening. This is done by checking your blood sugar (glucose) after you have not eaten for a while (fasting). Pelvic exam and Pap test. Hepatitis B test. Hepatitis C test. HIV (human immunodeficiency virus) test. STI (sexually transmitted infection) testing, if you are at risk. Lung cancer screening. Colorectal cancer screening. Mammogram. Talk with your health care provider about when you should start having regular mammograms. This may depend on whether you have a family history of breast cancer. BRCA-related cancer screening. This may be done if you have a family history of breast, ovarian, tubal, or peritoneal cancers. Bone density scan. This is done to screen for osteoporosis. Talk with your health care provider about your test results, treatment options, and if necessary, the need for more tests. Follow these instructions at home: Eating and drinking  Eat a diet that includes fresh fruits and vegetables, whole grains, lean protein, and low-fat dairy products. Take vitamin and mineral supplements as recommended by your health care provider. Do not drink alcohol if: Your health care provider tells you not to drink. You are pregnant, may be pregnant, or are planning to become pregnant. If you drink alcohol: Limit how much you have to 0-1 drink a day. Know how much alcohol is in your drink. In the [place] one drink equals one 12 oz bottle of beer (355 mL), one 5 oz glass of wine (148 mL), or one 1 oz glass of hard liquor (44 mL). Lifestyle Brush your teeth every morning and night with fluoride toothpaste. Floss one time each day. Exercise for at least 30 minutes 5 or more days each week. Do not use any products that  contain nicotine or tobacco. These products include cigarettes, chewing tobacco, and vaping devices, such as e-cigarettes. If you need help quitting, ask your health care provider. Do not use drugs. If  you are sexually active, practice safe sex. Use a condom or other form of protection to prevent STIs. If you do not wish to become pregnant, use a form of birth control. If you plan to become pregnant, see your health care provider for a prepregnancy visit. Take aspirin only as told by your health care provider. Make sure that you understand how much to take and what form to take. Work with your health care provider to find out whether it is safe and beneficial for you to take aspirin daily. Find healthy ways to manage stress, such as: Meditation, yoga, or listening to music. Journaling. Talking to a trusted person. Spending time with friends and family. Minimize exposure to UV radiation to reduce your risk of skin cancer. Safety Always wear your seat belt while driving or riding in a vehicle. Do not drive: If you have been drinking alcohol. Do not ride with someone who has been drinking. When you are tired or distracted. While texting. If you have been using any mind-altering substances or drugs. Wear a helmet and other protective equipment during sports activities. If you have firearms in your house, make sure you follow all gun safety procedures. Seek help if you have been physically or sexually abused. What's next? Visit your health care provider once a year for an annual wellness visit. Ask your health care provider how often you should have your eyes and teeth checked. Stay up to date on all vaccines. This information is not intended to replace advice given to you by your health care provider. Make sure you discuss any questions you have with your health care provider. Document Revised: 02/28/2021 Document Reviewed: 02/28/2021 Elsevier Patient Education  [place].

## 2022-06-07 NOTE — Assessment & Plan Note (Signed)
Second Shingrix and influenza vaccines due and provided. Pap smear UTD. Mammogram due in October 2023, orders placed. Colonoscopy overdue, she declines today despite recommendations.  Discussed the importance of a healthy diet and regular exercise in order for weight loss, and to reduce the risk of further co-morbidity.  Exam today stable Labs pending

## 2022-06-07 NOTE — Assessment & Plan Note (Signed)
Diagnostic mammogram due in October 2023, discussed with patient. Orders placed.

## 2022-06-10 LAB — HEMOGLOBIN A1C: Hgb A1c MFr Bld: 6.1 % (ref 4.6–6.5)

## 2022-06-14 ENCOUNTER — Ambulatory Visit (HOSPITAL_COMMUNITY): Payer: BC Managed Care – PPO

## 2022-06-14 ENCOUNTER — Encounter (HOSPITAL_COMMUNITY): Payer: Self-pay

## 2022-06-14 ENCOUNTER — Other Ambulatory Visit: Payer: Self-pay | Admitting: Vascular Surgery

## 2022-06-14 ENCOUNTER — Ambulatory Visit (HOSPITAL_COMMUNITY)
Admission: RE | Admit: 2022-06-14 | Discharge: 2022-06-14 | Disposition: A | Discharge status: Home / Self Care | Payer: BC Managed Care – PPO | Source: Ambulatory Visit | Attending: Vascular Surgery | Admitting: Vascular Surgery

## 2022-06-14 DIAGNOSIS — Z8679 Personal history of other diseases of the circulatory system: Secondary | ICD-10-CM | POA: Diagnosis not present

## 2022-06-14 DIAGNOSIS — I7772 Dissection of iliac artery: Secondary | ICD-10-CM | POA: Diagnosis not present

## 2022-06-14 DIAGNOSIS — J9811 Atelectasis: Secondary | ICD-10-CM | POA: Diagnosis not present

## 2022-06-14 DIAGNOSIS — I71019 Dissection of thoracic aorta, unspecified: Secondary | ICD-10-CM

## 2022-06-14 DIAGNOSIS — I712 Thoracic aortic aneurysm, without rupture, unspecified: Secondary | ICD-10-CM | POA: Diagnosis not present

## 2022-06-14 DIAGNOSIS — I7103 Dissection of thoracoabdominal aorta: Secondary | ICD-10-CM | POA: Diagnosis not present

## 2022-06-14 DIAGNOSIS — Z9889 Other specified postprocedural states: Secondary | ICD-10-CM | POA: Insufficient documentation

## 2022-06-14 DIAGNOSIS — I898 Other specified noninfective disorders of lymphatic vessels and lymph nodes: Secondary | ICD-10-CM | POA: Diagnosis not present

## 2022-06-14 MED ORDER — IOHEXOL 350 MG/ML SOLN
80.0000 mL | Freq: Once | INTRAVENOUS | Status: AC | PRN
  Administered 2022-06-14: 80 mL via INTRAVENOUS

## 2022-07-03 IMAGING — CT CT ANGIO CHEST-ABD-PELV FOR DISSECTION W/ AND WO/W CM
2 of 7 series · 13 of 46 positions shown, 15 images · non-contrast
Comparison: 06/12/2021

CLINICAL DATA: Lower abdominal and pelvic pain, history of type B
aortic dissection status post endovascular repair

EXAM:
CT ANGIOGRAPHY CHEST, ABDOMEN AND PELVIS
TECHNIQUE: Non-contrast CT of the chest was initially obtained.

[Series 7: dissection 2mm · axial · 0.77mm/px · z∈[+618,+1186]mm · 10 of 322 slices shown, 12 images]
[im 19/322  soft-tissue]
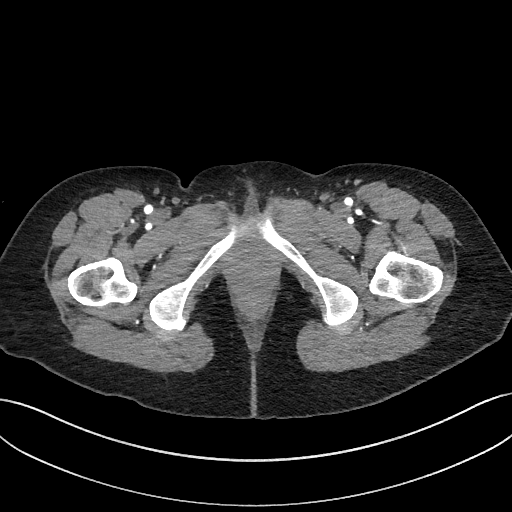
[im 19/322  bone]
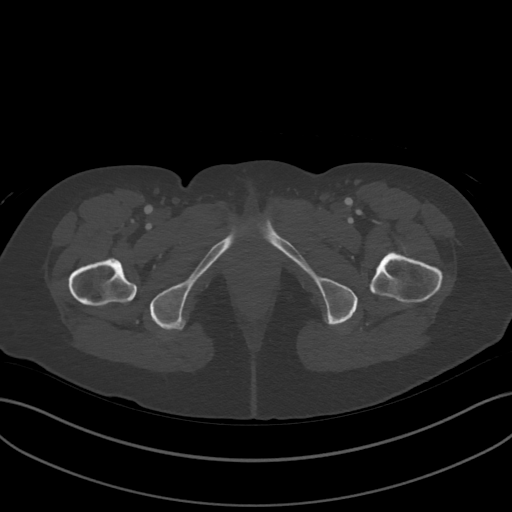
[im 57/322  soft-tissue]
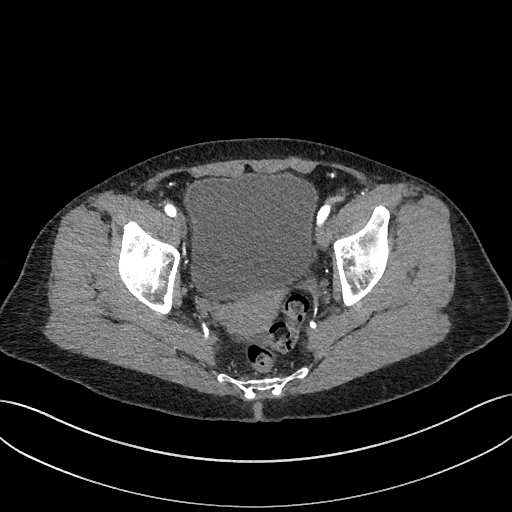
[im 95/322  soft-tissue]
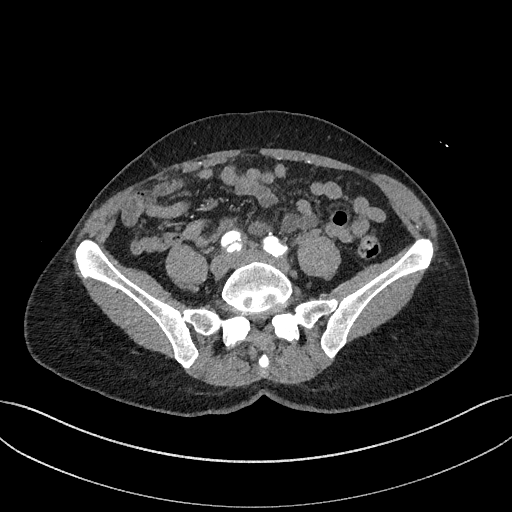
[im 114/322  soft-tissue]
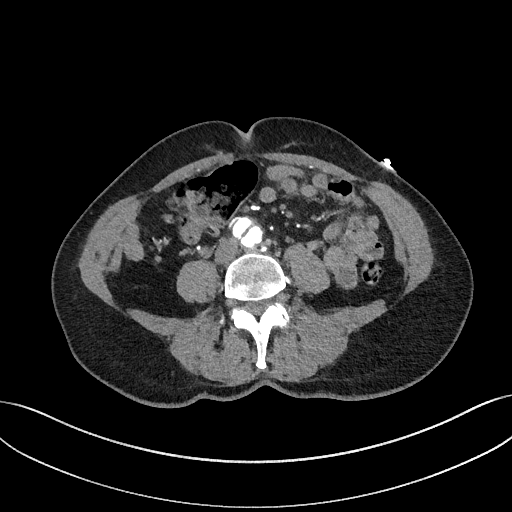
[im 152/322  soft-tissue]
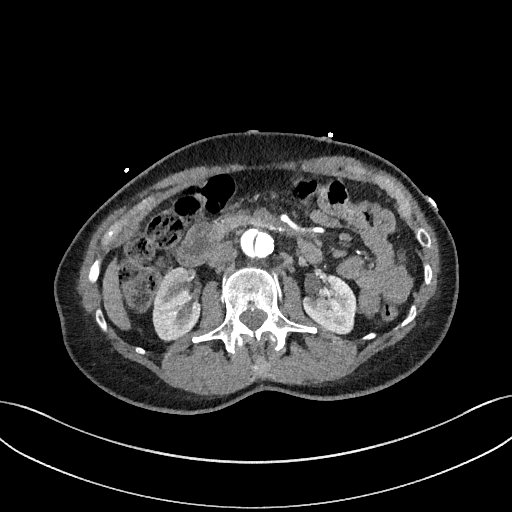
[im 170/322  soft-tissue]
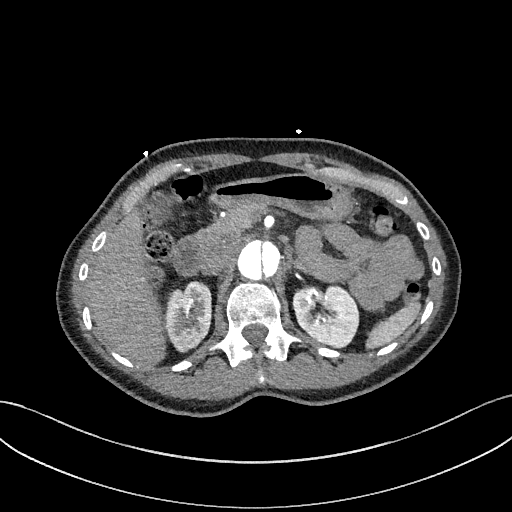
[im 208/322  soft-tissue]
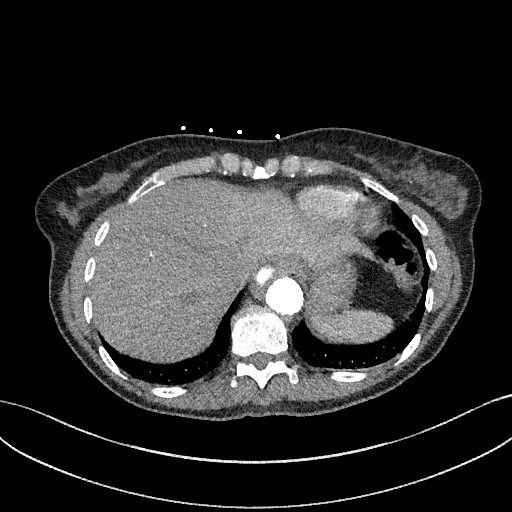
[im 246/322  soft-tissue]
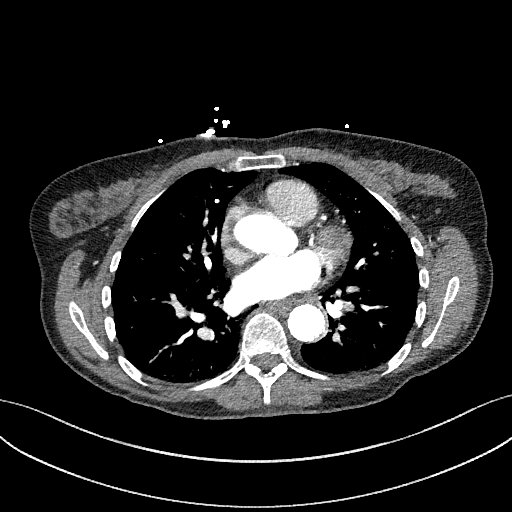
[im 265/322  soft-tissue]
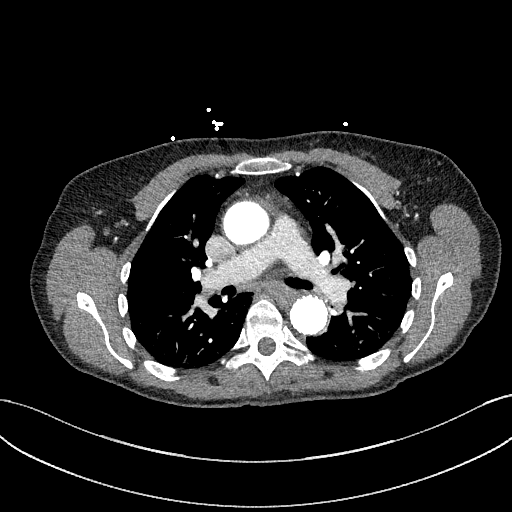
[im 265/322  bone]
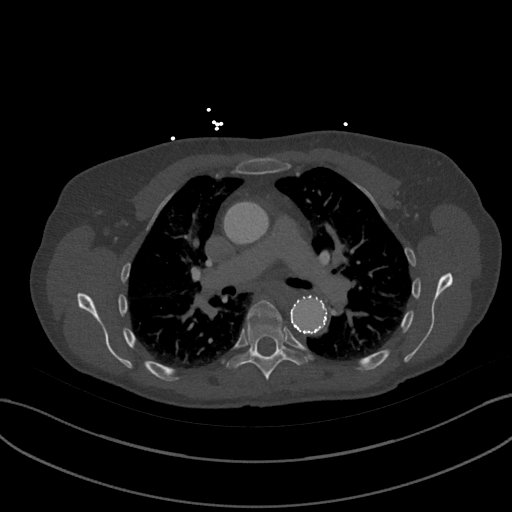
[im 303/322  soft-tissue]
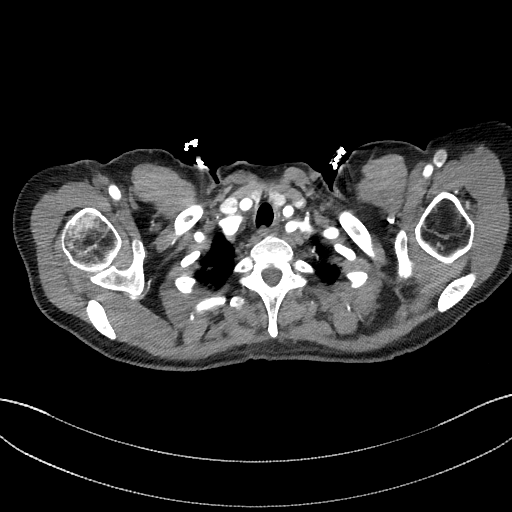

[Series 10: dissection 2mm cor · coronal · 0.79mm/px · 3 of 119 slices shown]
[im 30/119  soft-tissue]
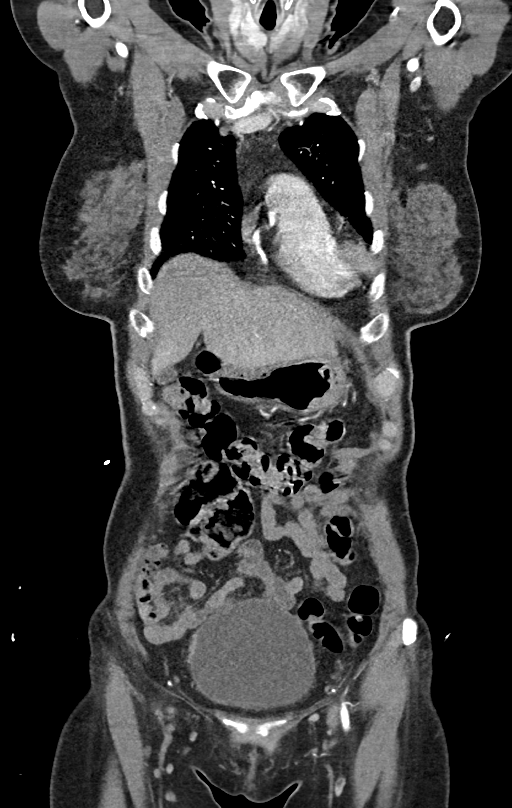
[im 60/119  soft-tissue]
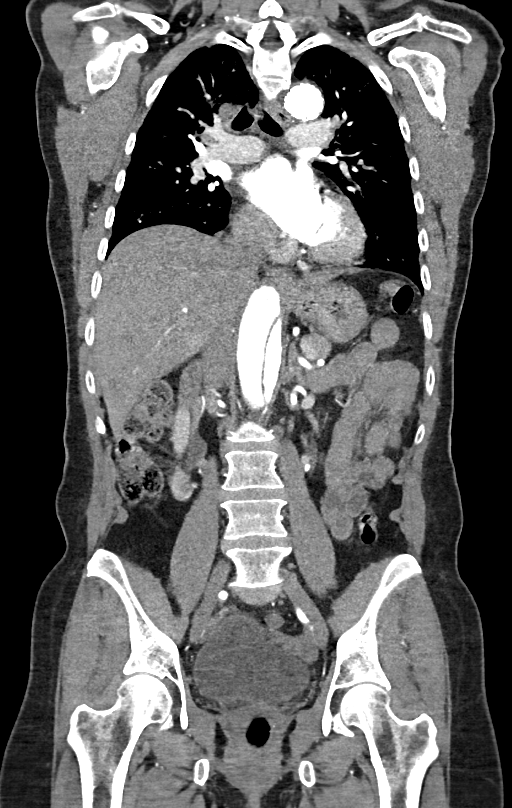
[im 89/119  soft-tissue]
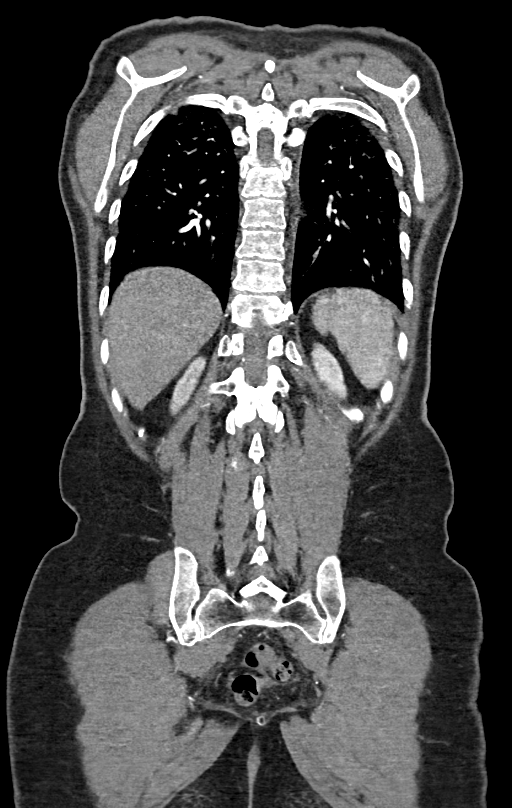

[13 of 46 positions shown; findings below may reference images not displayed]

Multidetector CT imaging through the chest, abdomen and pelvis was
performed using the standard protocol during bolus administration of
intravenous contrast. Multiplanar reconstructed images and MIPs were
obtained and reviewed to evaluate the vascular anatomy.

CONTRAST:  80mL OMNIPAQUE IOHEXOL 350 MG/ML SOLN
FINDINGS: CTA CHEST FINDINGS

Cardiovascular: Stable appearance of the endoluminal stent graft
involving the distal aortic arch through the diaphragmatic hiatus.
No evidence of thoracic aortic aneurysm or acute dissection. Stable
atheromatous plaque at the origin of the left subclavian artery,
without critical stenosis. Heart is unremarkable without pericardial
effusion. Timing of the contrast bolus limits evaluation of the
pulmonary vasculature.

Mediastinum/Nodes: No enlarged mediastinal, hilar, or axillary lymph
nodes. Thyroid gland, trachea, and esophagus demonstrate no
significant findings.

Lungs/Pleura: No acute airspace disease, effusion, or pneumothorax.
Central airways are widely patent.

Musculoskeletal: No acute or destructive bony lesions. Reconstructed
images demonstrate no additional findings.

Review of the MIP images confirms the above findings.

CTA ABDOMEN AND PELVIS FINDINGS

VASCULAR

Aorta: Chronic abdominal aortic dissection again identified, without
appreciable change. At the level of the fenestration within the
proximal abdominal aorta at the level of the diaphragmatic hiatus,
the aorta measures 3.6 x 3.1 cm, previously measuring 3.6 x 2.9 cm.
At this level of the fenestration is seen between the true and false
lumen. No evidence of critical stenosis.

Celiac: Patent, arising from the true lumen.  No critical stenosis.

SMA: Patent, arising from the true lumen.  No critical stenosis.

Renals: There are duplicated bilateral renal arteries. The main
renal arteries arise in the expected location off the true lumen,
without critical stenosis. There are accessory renal arteries
supplying the lower poles of the bilateral kidneys, rising inferior
to the level of the IMA. On the right, there are 2 accessory
arteries arising from the false lumen. On the left, there is a
single accessory artery arising from the true lumen.

IMA: The IMA is patent, with origin off of the true lumen. There is
marked narrowing of the origin of the IMA, greater than 90%, but
stable.

Inflow: Dissection extends into the common iliac arteries,
unchanged. Stable aneurysmal dilation of the right common iliac
artery measuring 1.9 cm. No significant stenosis.

Veins: No obvious venous abnormality within the limitations of this
arterial phase study.

Review of the MIP images confirms the above findings.

NON-VASCULAR

Hepatobiliary: No focal liver abnormality is seen. No gallstones,
gallbladder wall thickening, or biliary dilatation.

Pancreas: Unremarkable. No pancreatic ductal dilatation or
surrounding inflammatory changes.

Spleen: Normal in size without focal abnormality.

Adrenals/Urinary Tract: Adrenal glands are unremarkable. Kidneys are
normal, without renal calculi, focal lesion, or hydronephrosis.
Bladder is unremarkable.

Stomach/Bowel: No bowel obstruction or ileus. Normal appendix right
lower quadrant. No bowel wall thickening or inflammatory change.

Lymphatic: No pathologic adenopathy.

Reproductive: Uterus and bilateral adnexa are unremarkable.

Other: No free fluid or free gas.  No abdominal wall hernia.

Musculoskeletal: No acute or destructive bony lesions. Reconstructed
images demonstrate no additional findings.

Review of the MIP images confirms the above findings.
IMPRESSION: 1. Stable appearance of the type B aortic dissection, with stable
endoluminal stent graft extending from the distal aortic arch
through the diaphragmatic hiatus.
2. Stable aneurysmal dilation of the proximal abdominal aorta at the
diaphragmatic hiatus, measuring up to 3.6 cm. Fenestration between
the true and false lumen noted at this level, unchanged.
3. No acute intrathoracic, intra-abdominal, or intrapelvic process.
No explanation for the patient's reported lower abdominal and pelvic
pain.

## 2022-07-09 ENCOUNTER — Ambulatory Visit: Payer: BC Managed Care – PPO | Admitting: Vascular Surgery

## 2022-07-15 NOTE — Progress Notes (Unsigned)
[facility] [place]  ASSESSMENT / PLAN: 61 y.o. 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 Oneida Alar.  Overall, the repair appears good on most recent CT angiogram.  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  Follow-up with me in 1 year with repeat CT angiogram of the chest, abdomen, and pelvis.  CHIEF COMPLAINT: Surveillance after type B aortic dissection  HISTORY OF PRESENT ILLNESS: Heather Pollard is a 61 y.o. female who presents to clinic for surveillance of type B aortic dissection.  This was initially diagnosed in 2020.  The patient was initiated on medical therapy, but had propagation of her aortic dissection.  This prompted TEVAR by Dr. Oneida Alar February 22, 2019.  She did well from this.  She initially had difficulty managing her blood pressure, and was often hypotensive making her sluggish.  Her blood pressure regimen has been tailored and she tolerates Coreg and hydrochlorothiazide quite well.  She has many excellent questions about surveillance for type B aortic dissection, we spent the majority of the clinic visit discussing the rationale for surveillance and worrisome symptoms to look out for.  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 Oneida Alar  VASCULAR RISK FACTORS: Negative history of stroke / transient ischemic attack. Negative history of coronary artery disease.  Negative history of diabetes mellitus.  Positive history of smoking. Not actively smoking. Positive history of hypertension.  Negative history of chronic kidney disease.  Negative history of chronic obstructive pulmonary disease.  FUNCTIONAL STATUS: ECOG performance status: (0) Fully active, able to carry on all predisease performance without  restriction Ambulatory status: Ambulatory within the community without limits  Past Medical History:  Diagnosis Date   AAA (abdominal aortic aneurysm) (HCC)    Ectopic pregnancy    HTN (hypertension)    Hypertensive emergency    Thoracic aortic aneurysm ([place]) 02/2019   Tubal pregnancy     Past Surgical History:  Procedure Laterality Date   BREAST SURGERY     ECTOPIC PREGNANCY SURGERY     PARTIAL HYSTERECTOMY     THORACIC AORTIC ENDOVASCULAR STENT GRAFT N/A 02/22/2019   Procedure: THORACIC AORTIC ENDOVASCULAR STENT GRAFT;  Surgeon: Elam Dutch, MD;  Location: [facility] OR;  Service: Vascular;  Laterality: N/A;   ULTRASOUND GUIDANCE FOR VASCULAR ACCESS Right 02/22/2019   Procedure: Ultrasound Guidance For Vascular Access;  Surgeon: Elam Dutch, MD;  Location: [facility] OR;  Service: Vascular;  Laterality: Right;    Family History  Problem Relation Age of Onset   Cancer Mother        Peritoneal carcinoma    Heart disease Mother    Uterine cancer Mother    Hypothyroidism Mother    Alzheimer's disease Father    Stroke Father    Hypothyroidism Sister    Thyroid cancer Sister    Hypothyroidism Sister    Hypothyroidism Sister    Arthritis Brother     Social History   Socioeconomic History   Marital status: Married    Spouse name: Not on file   Number of children: Not on file   Years of education: Not on file   Highest education level: Not on file  Occupational History   Not on  file  Tobacco Use   Smoking status: Former    Packs/day: 1.00    Years: 20.00    Total pack years: 20.00    Types: Cigarettes    Quit date: 01/25/2019    Years since quitting: 3.4   Smokeless tobacco: Never  Vaping Use   Vaping Use: Never used  Substance and Sexual Activity   Alcohol use: No   Drug use: Never   Sexual activity: Not on file  Other Topics Concern   Not on file  Social History Narrative   Not on file   Social Determinants of Health   Financial Resource Strain: Not on file   Food Insecurity: Not on file  Transportation Needs: Not on file  Physical Activity: Not on file  Stress: Not on file  Social Connections: Not on file  Intimate Partner Violence: Not on file    Allergies  Allergen Reactions   Penicillins Hives and Diarrhea    Did it involve swelling of the face/tongue/throat, SOB, or low BP? No Did it involve sudden or severe rash/hives, skin peeling, or any reaction on the inside of your mouth or nose? No Did you need to seek medical attention at a hospital or doctor's office? No When did it last happen?  less than 10 years      If all above answers are "NO", may proceed with cephalosporin use.     Current Outpatient Medications  Medication Sig Dispense Refill   acetaminophen (TYLENOL) 500 MG tablet Take 1,000 mg by mouth every 6 (six) hours as needed for moderate pain or headache.     amLODipine (NORVASC) 5 MG tablet Take 1 tablet (5 mg total) by mouth daily. 90 tablet 3   aspirin EC 81 MG EC tablet Take 1 tablet (81 mg total) by mouth daily.     carvedilol (COREG) 25 MG tablet Take 1 tablet (25 mg total) by mouth 2 (two) times daily with a meal. 180 tablet 3   hydrochlorothiazide (HYDRODIURIL) 12.5 MG tablet Take 1 tablet (12.5 mg total) by mouth daily. For blood pressure. Office visit required for further refills. 90 tablet 0   rosuvastatin (CRESTOR) 10 MG tablet Take 1 tablet (10 mg total) by mouth daily. for cholesterol. Office visit required for further refills. 90 tablet 0   No current facility-administered medications for this visit.    REVIEW OF SYSTEMS:  [X]  denotes positive finding, [ ]  denotes negative finding Cardiac  Comments:  Chest pain or chest pressure:    Shortness of breath upon exertion:    Short of breath when lying flat:    Irregular heart rhythm:        Vascular    Pain in calf, thigh, or hip brought on by ambulation:    Pain in feet at night that wakes you up from your sleep:  x   Blood clot in your veins:    Leg  swelling:         Pulmonary    Oxygen at home:    Productive cough:     Wheezing:         Neurologic    Sudden weakness in arms or legs:     Sudden numbness in arms or legs:     Sudden onset of difficulty speaking or slurred speech:    Temporary loss of vision in one eye:     Problems with dizziness:         Gastrointestinal    Blood in stool:  Vomited blood:         Genitourinary    Burning when urinating:     Blood in urine:        Psychiatric    Major depression:         Hematologic    Bleeding problems:    Problems with blood clotting too easily:        Skin    Rashes or ulcers:        Constitutional    Fever or chills:      PHYSICAL EXAM There were no vitals filed for this visit.   Constitutional: well appearing. no distress. Appears well nourished.  Neurologic: CN intact. no focal findings. no sensory loss. Psychiatric:  Mood and affect symmetric and appropriate. Eyes:  No icterus. No conjunctival pallor. Ears, nose, throat:  mucous membranes moist. Midline trachea.  Cardiac: regular rate and rhythm.  Respiratory:  unlabored. Abdominal:  soft, non-tender, non-distended.  Peripheral vascular: 1+ DP pulses bilaterally Extremity: Trace edema bilateral lower extremities. No cyanosis. No pallor.  Skin: No gangrene. No ulceration.  Lymphatic: No Stemmer's sign. No palpable lymphadenopathy.  PERTINENT LABORATORY AND RADIOLOGIC DATA  Most recent CBC    Latest Ref Rng & Units 06/07/2022   10:20 AM 08/27/2021    9:01 PM 08/27/2021    8:06 PM  CBC  WBC 4.0 - 10.5 K/uL 6.3   3.7   Hemoglobin 12.0 - 15.0 g/dL 47.0  96.2  83.6   Hematocrit 36.0 - 46.0 % 42.0  39.0  38.7   Platelets 150.0 - 400.0 K/uL 220.0   169      Most recent CMP    Latest Ref Rng & Units 06/07/2022   10:20 AM 12/11/2021    3:46 PM 08/27/2021    9:01 PM  CMP  Glucose 70 - 99 mg/dL 99  92  629   BUN 6 - 23 mg/dL 22  16  19    Creatinine 0.40 - 1.20 mg/dL  4.76  5.46   Sodium  135 - 145 mEq/L 135  137  134   Potassium 3.5 - 5.1 mEq/L 4.2  4.2  3.4   Chloride 96 - 112 mEq/L 99  100  99   CO2 19 - 32 mEq/L 29  24    Calcium 8.4 - 10.5 mg/dL 9.4  9.4    Total Protein 6.0 - 8.3 g/dL 7.5     Total Bilirubin 0.2 - 1.2 mg/dL 0.4     Alkaline Phos 39 - 117 U/L 81     AST 0 - 37 U/L 24     ALT 0 - 35 U/L 19       Renal function CrCl cannot be calculated (Patient's most recent lab result is older than the maximum 21 days allowed.).  Hgb A1c MFr Bld (%)  Date Value  06/07/2022 6.1    LDL Cholesterol  Date Value Ref Range Status  06/07/2022 122 (H) 0 - 99 mg/dL Final     Vascular Imaging: Most recent CT angiogram personally reviewed in detail.  TEVAR appears well-seated without evidence of endoleak.  Chronic dissection process throughout the abdominal aorta.  The perivisceral abdominal aorta is aneurysmal, but stable measuring 3.5 cm.  There is ectasia of the right common iliac artery.  06/09/2022. Rande Brunt, MD [facility] Phone Number: [phone] 07/15/2022 5:26 PM  Total time spent on preparing this encounter including chart review, data review, collecting history, examining the  patient, coordinating care for this established patient, 30 minutes.  Portions of this report may have been transcribed using voice recognition software.  Every effort has been made to ensure accuracy; however, inadvertent computerized transcription errors may still be present.

## 2022-07-16 ENCOUNTER — Ambulatory Visit: Payer: BC Managed Care – PPO | Admitting: Vascular Surgery

## 2022-07-16 ENCOUNTER — Encounter: Payer: Self-pay | Admitting: Vascular Surgery

## 2022-07-16 VITALS — BP 120/65 | HR 62 | Temp 98.3°F | Resp 20 | Ht 65.0 in | Wt 150.0 lb

## 2022-07-16 DIAGNOSIS — Z95828 Presence of other vascular implants and grafts: Secondary | ICD-10-CM | POA: Diagnosis not present

## 2022-07-16 DIAGNOSIS — Z8679 Personal history of other diseases of the circulatory system: Secondary | ICD-10-CM | POA: Diagnosis not present

## 2022-07-16 DIAGNOSIS — Z9889 Other specified postprocedural states: Secondary | ICD-10-CM | POA: Diagnosis not present

## 2022-07-25 ENCOUNTER — Ambulatory Visit
Admission: RE | Admit: 2022-07-25 | Discharge: 2022-07-25 | Disposition: A | Discharge status: Home / Self Care | Payer: BC Managed Care – PPO | Source: Ambulatory Visit | Attending: Primary Care | Admitting: Primary Care

## 2022-07-25 DIAGNOSIS — R921 Mammographic calcification found on diagnostic imaging of breast: Secondary | ICD-10-CM | POA: Diagnosis not present

## 2022-08-12 ENCOUNTER — Other Ambulatory Visit (INDEPENDENT_AMBULATORY_CARE_PROVIDER_SITE_OTHER): Payer: BC Managed Care – PPO

## 2022-08-12 DIAGNOSIS — E785 Hyperlipidemia, unspecified: Secondary | ICD-10-CM | POA: Diagnosis not present

## 2022-08-12 LAB — LIPID PANEL
Cholesterol: 169 mg/dL (ref 0–200)
HDL: 42.4 mg/dL (ref >39.00)
LDL Cholesterol: 98 mg/dL (ref 0–99)
NonHDL: 127.05
Total CHOL/HDL Ratio: 4
Triglycerides: 147 mg/dL (ref 0.0–149.0)
VLDL: 29.4 mg/dL (ref 0.0–40.0)

## 2022-08-22 ENCOUNTER — Other Ambulatory Visit: Payer: Self-pay | Admitting: Primary Care

## 2022-08-22 DIAGNOSIS — I10 Essential (primary) hypertension: Secondary | ICD-10-CM

## 2022-10-24 ENCOUNTER — Other Ambulatory Visit: Payer: Self-pay | Admitting: Primary Care

## 2022-10-24 DIAGNOSIS — E785 Hyperlipidemia, unspecified: Secondary | ICD-10-CM

## 2022-11-06 ENCOUNTER — Other Ambulatory Visit: Payer: Self-pay

## 2022-11-06 ENCOUNTER — Telehealth: Payer: Self-pay | Admitting: Nurse Practitioner

## 2022-11-06 DIAGNOSIS — R0789 Other chest pain: Secondary | ICD-10-CM

## 2022-11-06 DIAGNOSIS — Z9889 Other specified postprocedural states: Secondary | ICD-10-CM

## 2022-11-06 DIAGNOSIS — I10 Essential (primary) hypertension: Secondary | ICD-10-CM

## 2022-11-06 MED ORDER — AMLODIPINE BESYLATE 5 MG PO TABS: 5.0000 mg | ORAL_TABLET | Freq: Every day | ORAL | 0 refills | Status: DC

## 2022-11-06 NOTE — Telephone Encounter (Signed)
Patient notified that her refill of amlodipine has been sent to [facility].

## 2022-11-06 NOTE — Telephone Encounter (Signed)
*  STAT* If patient is at the pharmacy, call can be transferred to refill team.   1. Which medications need to be refilled? (please list name of each medication and dose if known) amLODipine (NORVASC) 5 MG tablet   2. Which pharmacy/location (including street and city if local pharmacy) is medication to be sent to? [id number] [place], [place] - [address]   3. Do they need a 30 day or 90 day supply? [id number]

## 2022-12-13 ENCOUNTER — Ambulatory Visit: Payer: BC Managed Care – PPO | Admitting: Nurse Practitioner

## 2022-12-23 NOTE — Progress Notes (Unsigned)
Cardiology Office Note:    Date:  12/24/2022   ID:  Heather Pollard, DOB [birth year], MRN [id number]  PCP:  Doreene Nest, NP   [facility] Providers Cardiologist:  Tonny Bollman, MD     Referring MD: Doreene Nest, NP   Chief Complaint: annual follow-up HTN  History of Present Illness:    Heather Pollard is a very pleasant 62 y.o. female with a hx of type B aortic dissection in 2020, HTN, former tobacco abuse and hyperlipidemia.   Underwent TEVAR for treatment of her extensive aortic dissection and has been followed by vascular surgery since that time.  She was initially treated with amlodipine and carvedilol but developed leg swelling on the 10 mg dose of amlodipine.  She was switched to hydrochlorothiazide.  Last cardiology clinic visit was 12/11/2021 with Dr. Excell Seltzer at which time she reported episodic pain in the left upper back that comes on suddenly and feels sharp.  The symptoms have been self-limited. They are not consistently related to physical exertion. Also has some pain with taking a deep breath. He felt BP control was suboptimal and added amlodipine 5 mg daily. Steffanie Dunn was ordered for evaluation of ischemia and revealed normal perfusion, no evidence of ischemia or infarction, normal LV function. One year follow-up was recommended.  Today, she is here alone for follow-up. Reports she is hearing her heart beat in her ears, "whooshing" noise. This occurs intermittently with activity and with rest. Feels like heart rate speeds up, no palpitations or flutters. Also feeling fatigued. Worse over the past 6 months. No chest discomfort, SOB. Walks on treadmill 15 minutes at incline of 5 for exercise, light weight lifting, works in her yard but limits heavy lifting. Sometimes feels "a wave coming over me" that causes her to stop what she is doing, recovers after sitting for a few minutes. No presyncope, syncope. Feels this occurs more frequently in the mornings,  takes her medications on a staggered regimen throughout the day. Is compliant with medications and no concerning side effects. Home BP 110-112/58-60s typically.   Past Medical History:  Diagnosis Date   AAA (abdominal aortic aneurysm)    Ectopic pregnancy    HTN (hypertension)    Hypertensive emergency    Thoracic aortic aneurysm 02/2019   Tubal pregnancy     Past Surgical History:  Procedure Laterality Date   BREAST SURGERY     ECTOPIC PREGNANCY SURGERY     PARTIAL HYSTERECTOMY     THORACIC AORTIC ENDOVASCULAR STENT GRAFT N/A 02/22/2019   Procedure: THORACIC AORTIC ENDOVASCULAR STENT GRAFT;  Surgeon: Sherren Kerns, MD;  Location: [facility] OR;  Service: Vascular;  Laterality: N/A;   ULTRASOUND GUIDANCE FOR VASCULAR ACCESS Right 02/22/2019   Procedure: Ultrasound Guidance For Vascular Access;  Surgeon: Sherren Kerns, MD;  Location: [facility] OR;  Service: Vascular;  Laterality: Right;    Current Medications: Current Meds  Medication Sig   acetaminophen (TYLENOL) 500 MG tablet Take 1,000 mg by mouth every 6 (six) hours as needed for moderate pain or headache.   amLODipine (NORVASC) 5 MG tablet Take 1 tablet (5 mg total) by mouth daily.   aspirin EC 81 MG EC tablet Take 1 tablet (81 mg total) by mouth daily.   carvedilol (COREG) 25 MG tablet Take 1 tablet (25 mg total) by mouth 2 (two) times daily with a meal.   hydrochlorothiazide (HYDRODIURIL) 12.5 MG tablet Take 1 tablet (12.5 mg total) by mouth daily. For blood pressure.   [  DISCONTINUED] rosuvastatin (CRESTOR) 10 MG tablet Take 1 tablet (10 mg total) by mouth daily. for cholesterol.     Allergies:   Penicillins   Social History   Socioeconomic History   Marital status: Married    Spouse name: Not on file   Number of children: Not on file   Years of education: Not on file   Highest education level: Not on file  Occupational History   Not on file  Tobacco Use   Smoking status: Former    Packs/day: 1.00    Years: 20.00     Additional pack years: 0.00    Total pack years: 20.00    Types: Cigarettes    Quit date: 01/25/2019    Years since quitting: 3.9   Smokeless tobacco: Never  Vaping Use   Vaping Use: Never used  Substance and Sexual Activity   Alcohol use: No   Drug use: Never   Sexual activity: Not on file  Other Topics Concern   Not on file  Social History Narrative   Not on file   Social Determinants of Health   Financial Resource Strain: Not on file  Food Insecurity: Not on file  Transportation Needs: Not on file  Physical Activity: Not on file  Stress: Not on file  Social Connections: Not on file     Family History: The patient's family history includes Alzheimer's disease in her father; Arthritis in her brother; Cancer in her mother; Heart disease in her mother; Hypothyroidism in her mother, sister, sister, and sister; Stroke in her father; Thyroid cancer in her sister; Uterine cancer in her mother.  ROS:   Please see the history of present illness.    + pulsatile tinnitus All other systems reviewed and are negative.  Labs/Other Studies Reviewed:    The following studies were reviewed today:  CT Angio Chest/Abd/Pel 06/14/22 IMPRESSION: 1. Stable postoperative changes of thoracic endovascular aortic repair of a Stanford type B descending thoracic aortic dissection extending throughout the abdominal aorta and into the right greater than left iliac arteries. 2. Stable mild aneurysmal dissection of the distal descending thoracic aorta measuring 3.7 cm at the distal landing zone of the stent graft. 3. Stable mild aneurysmal dissection of the visceral abdominal aorta with a maximal diameter of 3.6 cm. Recommend follow-up every 2 years. This recommendation follows ACR consensus guidelines: White Paper of the ACR Incidental Findings Committee II on Vascular Findings. J Am Coll Radiol 2013; 10:789-794. 4. Additional ancillary findings as above without significant interval change.     Lexiscan Myoview 01/28/22   The study is normal. The study is low risk.   No ST deviation was noted. Non spectific ST changes.   LV perfusion is normal. There is no evidence of ischemia. There is no evidence of infarction.   Left ventricular function is normal. Nuclear stress EF: 70 %. The left ventricular ejection fraction is hyperdynamic (>65%). End diastolic cavity size is normal. End systolic cavity size is normal.   Echo 01/20/19 1. The left ventricle has normal systolic function, with an ejection  fraction of 60-65%. The cavity size was normal. There is mildly increased  left ventricular wall thickness. No evidence of left ventricular regional  wall motion abnormalities.   2. The right ventricle has normal systolc function. The cavity was  normal. There is no increase in right ventricular wall thickness.   3. Left atrial size was mildly dilated.   4. Trivial pericardial effusion is present.   5. No evidence of  mitral valve stenosis. Trivial mitral regurgitation.   6. The aortic valve is tricuspid Mild calcification of the aortic valve.  No stenosis of the aortic valve.   7. The aortic root is normal in size and structure.   8. The IVC was normal in size. No complete TR doppler jet so unable to  estimate PA systolic pressure.  Recent Labs: 06/07/2022: ALT 19; BUN 22; Creatinine, Ser 1.14; Hemoglobin 14.2; Platelets 220.0; Potassium 4.2; Sodium 135  Recent Lipid Panel    Component Value Date/Time   CHOL 169 08/12/2022 0803   TRIG 147.0 08/12/2022 0803   HDL 42.40 08/12/2022 0803   CHOLHDL 4 08/12/2022 0803   VLDL 29.4 08/12/2022 0803   LDLCALC 98 08/12/2022 0803     Risk Assessment/Calculations:           Physical Exam:    VS:  BP 116/80   Pulse (!) 54   Ht 5\' 5"  (1.651 m)   Wt 146 lb (66.2 kg)   SpO2 97%   BMI 24.30 kg/m     Wt Readings from Last 3 Encounters:  12/24/22 146 lb (66.2 kg)  07/16/22 150 lb (68 kg)  06/07/22 151 lb (68.5 kg)     GEN:  Well  nourished, well developed in no acute distress HEENT: Normal NECK: No JVD; No carotid bruits CARDIAC: RRR, no murmurs, rubs, gallops RESPIRATORY:  Clear to auscultation without rales, wheezing or rhonchi  ABDOMEN: Soft, non-tender, non-distended MUSCULOSKELETAL:  No edema; No deformity. 2+ pedal pulses, equal bilaterally SKIN: Warm and dry NEUROLOGIC:  Alert and oriented x 3 PSYCHIATRIC:  Normal affect   EKG:  EKG is ordered today.  The ekg ordered today demonstrates sinus bradycardia at 54 bpm with first-degree AV block, PR interval 308 MS, low voltage QRS, nonspecific T wave abnormality   Diagnoses:    1. Pulsatile tinnitus of both ears   2. Bilateral carotid artery stenosis   3. Hyperlipidemia, unspecified hyperlipidemia type   4. Paroxysmal tachycardia, unspecified   5. Aortic dissection distal to left subclavian   6. Ruptured aneurysm of thoracic aorta, unspecified part   7. Sinus bradycardia   8. Hyperlipidemia LDL goal <70   9. First degree AV block    Assessment and Plan:     Pulsatile tinnitus: Is hearing pulse in both ears at rest and with exertion. Also experiencing occasional feelings of being "washed out."  Feels like HR speeds up at times.  We will place a 14-day Zio patch monitor for evaluation of arrhythmia.  Carotid artery disease: 1-39% stenosis bilateral carotid arteries on duplex 01/2019. We will repeat carotid duplex for evaluation of worsening stenosis in the setting of pulsatile tinnitus as noted above. Continue aspirin.   Sinus bradycardia/First degree AV block: HR is 54 bpm today. She is having episodes of feeling "washed out." Will place 14 day monitor to evaluate for pauses or significant bradycardia.   Thoracic aortic aneurysm/AAA s/p TEVAR: Stable findings on CTA 06/14/22. Continue to avoid heavy lifting, avoid fluoroquinolone antibiotics. Maintain consistent follow-up with [facility].  Hypertension: BP is well controlled.  No medication changes  today.  Hyperlipidemia: LDL 122 on 06/07/22, improved to 98 on 08/12/22. Was advised to continue rosuvastatin 10  mg daily. With history of aortic disease, would like to achieve LDL < 70. Will have her increase rosuvastatin to 20 mg daily. Will recheck lipid at next office visit.      Disposition: 2 months with me  Medication Adjustments/Labs and Tests Ordered: Current  medicines are reviewed at length with the patient today.  Concerns regarding medicines are outlined above.  Orders Placed This Encounter  Procedures   LONG TERM MONITOR (3-14 DAYS)   EKG 12-Lead   VAS US CAROTID   Meds ordered this encounter  Medications   rosuvastatin (CRESTOR) 20 MG tablet    Sig: Take 1 tablet (20 mg total) by mouth daily. for cholesterol.    Dispense:  90 tablet    Refill:  3    Patient Instructions  Medication Instructions:   INCREASE Rosuvastatin one (1) tablet by mouth ( 20 mg) daily.  You can take two (2) of your (10 mg) tablets at the same time to use them up.   *If you need a refill on your cardiac medications before your next appointment, please call your pharmacy*   Lab Work:  None ordered.  If you have labs (blood work) drawn today and your tests are completely normal, you will receive your results only by: MyChart Message (if you have MyChart) OR A paper copy in the mail If you have any lab test that is abnormal or we need to change your treatment, we will call you to review the results.   Testing/Procedures:  Your physician has requested that you have a carotid duplex. This test is an ultrasound of the carotid arteries in your neck. It looks at blood flow through these arteries that supply the brain with blood. Allow one hour for this exam. There are no restrictions or special instructions. ZIO XT- Long Term Monitor Instructions  Your physician has requested you wear a ZIO patch monitor for 14 days.  This is a single patch monitor. [employer] supplies one patch monitor per  enrollment. Additional stickers are not available. Please do not apply patch if you will be having a Nuclear Stress Test,  Echocardiogram, Cardiac CT, MRI, or Chest Xray during the period you would be wearing the  monitor. The patch cannot be worn during these tests. You cannot remove and re-apply the  ZIO XT patch monitor.  Your ZIO patch monitor will be mailed 3 day USPS to your address on file. It may take 3-5 days  to receive your monitor after you have been enrolled.  Once you have received your monitor, please review the enclosed instructions. Your monitor  has already been registered assigning a specific monitor serial # to you.  Billing and Patient Assistance Program Information  We have supplied [employer] with any of your insurance information on file for billing purposes. [employer] offers a sliding scale Patient Assistance Program for patients that do not have  insurance, or whose insurance does not completely cover the cost of the ZIO monitor.  You must apply for the Patient Assistance Program to qualify for this discounted rate.  To apply, please call [employer] at [phone], select option 4, select option 2, ask to apply for  Patient Assistance Program. Meredeth Ide will ask your household income, and how many people  are in your household. They will quote your out-of-pocket cost based on that information.  [employer] will also be able to set up a 17-month, interest-free payment plan if needed.  Applying the monitor   Shave hair from upper left chest.  Hold abrader disc by orange tab. Rub abrader in 40 strokes over the upper left chest as  indicated in your monitor instructions.  Clean area with 4 enclosed alcohol pads. Let dry.  Apply patch as indicated in monitor instructions. Patch will be placed under collarbone on  left  side of chest with arrow pointing upward.  Rub patch adhesive wings for 2 minutes. Remove white label marked "1". Remove the white  label marked "2". Rub patch  adhesive wings for 2 additional minutes.  While looking in a mirror, press and release button in center of patch. A small green light will  flash 3-4 times. This will be your only indicator that the monitor has been turned on.  Do not shower for the first 24 hours. You may shower after the first 24 hours.  Press the button if you feel a symptom. You will hear a small click. Record Date, Time and  Symptom in the Patient Logbook.  When you are ready to remove the patch, follow instructions on the last 2 pages of Patient  Logbook. Stick patch monitor onto the last page of Patient Logbook.  Place Patient Logbook in the blue and white box. Use locking tab on box and tape box closed  securely. The blue and white box has prepaid postage on it. Please place it in the mailbox as  soon as possible. Your physician should have your test results approximately 7 days after the  monitor has been mailed back to [facility].  Call [facility] Customer Care at [phone] if you have questions regarding  your ZIO XT patch monitor. Call them immediately if you see an orange light blinking on your  monitor.  If your monitor falls off in less than 4 days, contact our Monitor department at [phone].  If your monitor becomes loose or falls off after 4 days call [employer] at [phone] for  suggestions on securing your monitor    Follow-Up: At [facility], you and your health needs are our priority.  As part of our continuing mission to provide you with exceptional heart care, we have created designated Provider Care Teams.  These Care Teams include your primary Cardiologist (physician) and Advanced Practice Providers (APPs -  Physician Assistants and Nurse Practitioners) who all work together to provide you with the care you need, when you need it.  We recommend signing up for the patient portal called "MyChart".  Sign up information is provided on this After Visit Summary.  MyChart is used  to connect with patients for Virtual Visits (Telemedicine).  Patients are able to view lab/test results, encounter notes, upcoming appointments, etc.  Non-urgent messages can be sent to your provider as well.   To learn more about what you can do with MyChart, go to [email or web address].    Your next appointment:   2 month(s) come fasting.   Provider:   Eligha BridegroomMichelle Jaymond Waage, NP            Signed, Levi AlandSwinyer, Shail Urbas M, NP  12/24/2022 9:46 AM    [facility]

## 2022-12-24 ENCOUNTER — Ambulatory Visit (INDEPENDENT_AMBULATORY_CARE_PROVIDER_SITE_OTHER): Payer: Commercial Managed Care - PPO

## 2022-12-24 ENCOUNTER — Ambulatory Visit: Payer: Commercial Managed Care - PPO | Attending: Nurse Practitioner | Admitting: Nurse Practitioner

## 2022-12-24 ENCOUNTER — Encounter: Payer: Self-pay | Admitting: Nurse Practitioner

## 2022-12-24 VITALS — BP 116/80 | HR 54 | Ht 65.0 in | Wt 146.0 lb

## 2022-12-24 DIAGNOSIS — E785 Hyperlipidemia, unspecified: Secondary | ICD-10-CM

## 2022-12-24 DIAGNOSIS — I6523 Occlusion and stenosis of bilateral carotid arteries: Secondary | ICD-10-CM | POA: Diagnosis not present

## 2022-12-24 DIAGNOSIS — I440 Atrioventricular block, first degree: Secondary | ICD-10-CM

## 2022-12-24 DIAGNOSIS — I479 Paroxysmal tachycardia, unspecified: Secondary | ICD-10-CM

## 2022-12-24 DIAGNOSIS — H93A3 Pulsatile tinnitus, bilateral: Secondary | ICD-10-CM

## 2022-12-24 DIAGNOSIS — I71019 Dissection of thoracic aorta, unspecified: Secondary | ICD-10-CM

## 2022-12-24 DIAGNOSIS — R001 Bradycardia, unspecified: Secondary | ICD-10-CM

## 2022-12-24 DIAGNOSIS — I711 Thoracic aortic aneurysm, ruptured, unspecified: Secondary | ICD-10-CM

## 2022-12-24 MED ORDER — ROSUVASTATIN CALCIUM 20 MG PO TABS: 20.0000 mg | ORAL_TABLET | Freq: Every day | ORAL | 3 refills | Status: DC

## 2022-12-24 NOTE — Patient Instructions (Signed)
Medication Instructions:   INCREASE Rosuvastatin one (1) tablet by mouth ( 20 mg) daily.  You can take two (2) of your (10 mg) tablets at the same time to use them up.   *If you need a refill on your cardiac medications before your next appointment, please call your pharmacy*   Lab Work:  None ordered.  If you have labs (blood work) drawn today and your tests are completely normal, you will receive your results only by: MyChart Message (if you have MyChart) OR A paper copy in the mail If you have any lab test that is abnormal or we need to change your treatment, we will call you to review the results.   Testing/Procedures:  Your physician has requested that you have a carotid duplex. This test is an ultrasound of the carotid arteries in your neck. It looks at blood flow through these arteries that supply the brain with blood. Allow one hour for this exam. There are no restrictions or special instructions. ZIO XT- Long Term Monitor Instructions  Your physician has requested you wear a ZIO patch monitor for 14 days.  This is a single patch monitor. [employer] supplies one patch monitor per enrollment. Additional stickers are not available. Please do not apply patch if you will be having a Nuclear Stress Test,  Echocardiogram, Cardiac CT, MRI, or Chest Xray during the period you would be wearing the  monitor. The patch cannot be worn during these tests. You cannot remove and re-apply the  ZIO XT patch monitor.  Your ZIO patch monitor will be mailed 3 day USPS to your address on file. It may take 3-5 days  to receive your monitor after you have been enrolled.  Once you have received your monitor, please review the enclosed instructions. Your monitor  has already been registered assigning a specific monitor serial # to you.  Billing and Patient Assistance Program Information  We have supplied [employer] with any of your insurance information on file for billing purposes. [employer] offers a  sliding scale Patient Assistance Program for patients that do not have  insurance, or whose insurance does not completely cover the cost of the ZIO monitor.  You must apply for the Patient Assistance Program to qualify for this discounted rate.  To apply, please call [employer] at [phone], select option 4, select option 2, ask to apply for  Patient Assistance Program. Meredeth Ide will ask your household income, and how many people  are in your household. They will quote your out-of-pocket cost based on that information.  [employer] will also be able to set up a 15-month, interest-free payment plan if needed.  Applying the monitor   Shave hair from upper left chest.  Hold abrader disc by orange tab. Rub abrader in 40 strokes over the upper left chest as  indicated in your monitor instructions.  Clean area with 4 enclosed alcohol pads. Let dry.  Apply patch as indicated in monitor instructions. Patch will be placed under collarbone on left  side of chest with arrow pointing upward.  Rub patch adhesive wings for 2 minutes. Remove white label marked "1". Remove the white  label marked "2". Rub patch adhesive wings for 2 additional minutes.  While looking in a mirror, press and release button in center of patch. A small green light will  flash 3-4 times. This will be your only indicator that the monitor has been turned on.  Do not shower for the first 24 hours. You may shower after the first  24 hours.  Press the button if you feel a symptom. You will hear a small click. Record Date, Time and  Symptom in the Patient Logbook.  When you are ready to remove the patch, follow instructions on the last 2 pages of Patient  Logbook. Stick patch monitor onto the last page of Patient Logbook.  Place Patient Logbook in the blue and white box. Use locking tab on box and tape box closed  securely. The blue and white box has prepaid postage on it. Please place it in the mailbox as  soon as possible. Your physician  should have your test results approximately 7 days after the  monitor has been mailed back to [facility].  Call [facility] Customer Care at [phone] if you have questions regarding  your ZIO XT patch monitor. Call them immediately if you see an orange light blinking on your  monitor.  If your monitor falls off in less than 4 days, contact our Monitor department at [phone].  If your monitor becomes loose or falls off after 4 days call [employer] at [phone] for  suggestions on securing your monitor    Follow-Up: At [facility], you and your health needs are our priority.  As part of our continuing mission to provide you with exceptional heart care, we have created designated Provider Care Teams.  These Care Teams include your primary Cardiologist (physician) and Advanced Practice Providers (APPs -  Physician Assistants and Nurse Practitioners) who all work together to provide you with the care you need, when you need it.  We recommend signing up for the patient portal called "MyChart".  Sign up information is provided on this After Visit Summary.  MyChart is used to connect with patients for Virtual Visits (Telemedicine).  Patients are able to view lab/test results, encounter notes, upcoming appointments, etc.  Non-urgent messages can be sent to your provider as well.   To learn more about what you can do with MyChart, go to [email or web address].    Your next appointment:   2 month(s) come fasting.   Provider:   Eligha Bridegroom, NP

## 2022-12-24 NOTE — Progress Notes (Unsigned)
ZIO XT serial # [id number] from office inventory applied to patient.   Dr. Excell Seltzer to read.

## 2022-12-31 ENCOUNTER — Ambulatory Visit (HOSPITAL_COMMUNITY): Payer: Commercial Managed Care - PPO

## 2023-01-10 ENCOUNTER — Ambulatory Visit (HOSPITAL_COMMUNITY)
Admission: RE | Admit: 2023-01-10 | Discharge: 2023-01-10 | Disposition: A | Discharge status: Home / Self Care | Payer: Commercial Managed Care - PPO | Source: Ambulatory Visit | Attending: Nurse Practitioner | Admitting: Nurse Practitioner

## 2023-01-10 DIAGNOSIS — H93A3 Pulsatile tinnitus, bilateral: Secondary | ICD-10-CM | POA: Diagnosis present

## 2023-01-10 DIAGNOSIS — E785 Hyperlipidemia, unspecified: Secondary | ICD-10-CM

## 2023-01-10 DIAGNOSIS — I479 Paroxysmal tachycardia, unspecified: Secondary | ICD-10-CM | POA: Diagnosis present

## 2023-01-10 DIAGNOSIS — I6523 Occlusion and stenosis of bilateral carotid arteries: Secondary | ICD-10-CM

## 2023-01-13 ENCOUNTER — Other Ambulatory Visit: Payer: Self-pay | Admitting: Cardiovascular Disease

## 2023-01-13 DIAGNOSIS — I10 Essential (primary) hypertension: Secondary | ICD-10-CM

## 2023-01-13 DIAGNOSIS — R0789 Other chest pain: Secondary | ICD-10-CM

## 2023-01-13 DIAGNOSIS — Z9889 Other specified postprocedural states: Secondary | ICD-10-CM

## 2023-01-20 ENCOUNTER — Telehealth: Payer: Self-pay | Admitting: *Deleted

## 2023-01-20 MED ORDER — CARVEDILOL 25 MG PO TABS: 12.5000 mg | ORAL_TABLET | Freq: Two times a day (BID) | ORAL | 3 refills | Status: DC

## 2023-01-20 NOTE — Telephone Encounter (Signed)
Carvedilol dose changed to 25 mg taking 1/2 tablet bid.

## 2023-02-24 NOTE — Progress Notes (Signed)
Cardiology Office Note:    Date:  03/03/2023   ID:  Heather Pollard, DOB [birth year], MRN [id number]  PCP:  Doreene Nest, NP   [facility] Providers Cardiologist:  Tonny Bollman, MD     Referring MD: Doreene Nest, NP   Chief Complaint: annual follow-up HTN  History of Present Illness:    Heather Pollard is a very pleasant 62 y.o. female with a hx of type B aortic dissection in 2020, HTN, former tobacco abuse and hyperlipidemia.   Underwent TEVAR for treatment of her extensive aortic dissection and has been followed by vascular surgery since that time.  She was initially treated with amlodipine and carvedilol but developed leg swelling on the 10 mg dose of amlodipine.  She was switched to hydrochlorothiazide.  Seen in cardiology clinic on 12/11/2021 by Dr. Excell Seltzer at which time she reported episodic pain in the left upper back that comes on suddenly and feels sharp.  The symptoms have been self-limited. They are not consistently related to physical exertion. Also has some pain with taking a deep breath. He felt BP control was suboptimal and added amlodipine 5 mg daily. Heather Pollard was ordered for evaluation of ischemia and revealed normal perfusion, no evidence of ischemia or infarction, normal LV function. One year follow-up was recommended.  Seen in clinic by me on 12/24/22. Reported hearing her heart beat in her ears, "whooshing" noise. Occurring intermittently with activity and with rest. Feels like heart rate speeds up, no palpitations or flutters. Also feeling fatigued. Worse over the past 6 months. No chest discomfort, SOB. Walks on treadmill 15 minutes at incline of 5 for exercise, light weight lifting, works in her yard but limits heavy lifting. Sometimes feels "a wave coming over me" that causes her to stop what she is doing, recovers after sitting for a few minutes. No presyncope, syncope. Feels this occurs more frequently in the mornings, takes her medications  on a staggered regimen throughout the day. Is compliant with medications and no concerning side effects. Home BP 110-112/58-60s typically.  Carotid ultrasound obtained due to symptoms and revealed 1 to 39% stenosis bilateral carotid arteries.  Cardiac monitor revealed average HR 63 bpm with predominant rhythm sinus rhythm.  Short supraventricular runs, the longest lasting 11 seconds.  One pause of 2.6 seconds, no sustained bradycardia arrhythmia or pathologic pauses greater than 3 seconds, no atrial fibrillation or flutter. I advised her to reduce carvedilol from 25 mg BID to 12.5 mg BID and monitor HR and BP.   Today, she is here for follow-up. Reports she continues to hear the whooshing in her ears and has decreased hearing in left ear. Feels like it is worse if she is more active. No dizziness, presynocpe, or syncope. Home SBP average 124, average pulse 55. Is concerned that BP is higher than recommended for aortic aneurysm. Is active at home in her pool - does water aerobics and swims laps. Also walks on the treadmill for exercise. Eats a healthy diet - limits sodium, avoids fried and processed foods as well as sugar. She denies chest pain, shortness of breath, lower extremity edema, fatigue, palpitations, melena, hematuria, hemoptysis, diaphoresis, weakness, presyncope, syncope, orthopnea, and PND.    Past Medical History:  Diagnosis Date   AAA (abdominal aortic aneurysm) (HCC)    Ectopic pregnancy    HTN (hypertension)    Hypertensive emergency    Thoracic aortic aneurysm (HCC) 02/2019   Tubal pregnancy     Past Surgical History:  Procedure Laterality Date   BREAST SURGERY     ECTOPIC PREGNANCY SURGERY     PARTIAL HYSTERECTOMY     THORACIC AORTIC ENDOVASCULAR STENT GRAFT N/A 02/22/2019   Procedure: THORACIC AORTIC ENDOVASCULAR STENT GRAFT;  Surgeon: Sherren Kerns, MD;  Location: [facility] OR;  Service: Vascular;  Laterality: N/A;   ULTRASOUND GUIDANCE FOR VASCULAR ACCESS Right 02/22/2019    Procedure: Ultrasound Guidance For Vascular Access;  Surgeon: Sherren Kerns, MD;  Location: [facility] OR;  Service: Vascular;  Laterality: Right;    Current Medications: Current Meds  Medication Sig   acetaminophen (TYLENOL) 500 MG tablet Take 1,000 mg by mouth every 6 (six) hours as needed for moderate pain or headache.   amLODipine (NORVASC) 5 MG tablet TAKE 1 TABLET (5 MG TOTAL) BY MOUTH DAILY.   aspirin EC 81 MG EC tablet Take 1 tablet (81 mg total) by mouth daily.   carvedilol (COREG) 25 MG tablet Take 0.5 tablets (12.5 mg total) by mouth 2 (two) times daily.   potassium chloride (KLOR-CON M) 10 MEQ tablet Take 1 tablet (10 mEq total) by mouth daily.   rosuvastatin (CRESTOR) 20 MG tablet Take 1 tablet (20 mg total) by mouth daily. for cholesterol.   [DISCONTINUED] hydrochlorothiazide (HYDRODIURIL) 12.5 MG tablet Take 1 tablet (12.5 mg total) by mouth daily. For blood pressure.     Allergies:   Penicillins   Social History   Socioeconomic History   Marital status: Married    Spouse name: Not on file   Number of children: Not on file   Years of education: Not on file   Highest education level: Not on file  Occupational History   Not on file  Tobacco Use   Smoking status: Former    Packs/day: 1.00    Years: 20.00    Additional pack years: 0.00    Total pack years: 20.00    Types: Cigarettes    Quit date: 01/25/2019    Years since quitting: 4.1   Smokeless tobacco: Never  Vaping Use   Vaping Use: Never used  Substance and Sexual Activity   Alcohol use: No   Drug use: Never   Sexual activity: Not on file  Other Topics Concern   Not on file  Social History Narrative   Not on file   Social Determinants of Health   Financial Resource Strain: Not on file  Food Insecurity: Not on file  Transportation Needs: Not on file  Physical Activity: Not on file  Stress: Not on file  Social Connections: Not on file     Family History: The patient's family history includes  Alzheimer's disease in her father; Arthritis in her brother; Cancer in her mother; Heart disease in her mother; Hypothyroidism in her mother, sister, sister, and sister; Stroke in her father; Thyroid cancer in her sister; Uterine cancer in her mother.  ROS:   Please see the history of present illness.    + pulsatile tinnitus All other systems reviewed and are negative.  Labs/Other Studies Reviewed:    The following studies were reviewed today:  Cardiac Monitor 01/19/23 Patient had a min HR of 24 bpm, max HR of 135 bpm, and avg HR of 63 bpm. Predominant underlying rhythm was Sinus Rhythm. First Degree AV Block was present. 7 Supraventricular Tachycardia runs occurred, the run with the fastest interval lasting 11.0 secs  with a max rate of 135 bpm (avg 118 bpm); the run with the fastest interval was also the longest. 1 episode(s) of  AV Block (High Grade) occurred, lasting a total of 3 secs. Second Degree AV Block-Mobitz I (Wenckebach) was present. Isolated SVEs were rare  (<1.0%), SVE Couplets were rare (<1.0%), and SVE Triplets were rare (<1.0%). Isolated VEs were rare (<1.0%), VE Couplets were rare (<1.0%), and no VE Triplets were present.   SUMMARY: the basic rhythm is normal sinus with an average HR of 63 bpm. Short supraventricular runs noted as above, the longest lasting 11 seconds. There is one pause of 2.6 seconds. No sustained bradyarrhythmia or pathologic pauses > 3 seconds. No atrial fibrillation or flutter.      Carotid Duplex 01/10/23 Summary:  Right Carotid: Velocities in the right ICA are consistent with a 1-39%  stenosis.   Left Carotid: Velocities in the left ICA are consistent with a 1-39%  stenosis.   Vertebrals: Bilateral vertebral arteries demonstrate antegrade flow.  Subclavians: Normal flow hemodynamics were seen in bilateral subclavian               arteries.   *See table(s) above for measurements and observations.   CT Angio Chest/Abd/Pel  06/14/22 IMPRESSION: 1. Stable postoperative changes of thoracic endovascular aortic repair of a Stanford type B descending thoracic aortic dissection extending throughout the abdominal aorta and into the right greater than left iliac arteries. 2. Stable mild aneurysmal dissection of the distal descending thoracic aorta measuring 3.7 cm at the distal landing zone of the stent graft. 3. Stable mild aneurysmal dissection of the visceral abdominal aorta with a maximal diameter of 3.6 cm. Recommend follow-up every 2 years. This recommendation follows ACR consensus guidelines: White Paper of the ACR Incidental Findings Committee II on Vascular Findings. J Am Coll Radiol 2013; 10:789-794. 4. Additional ancillary findings as above without significant interval change.    Lexiscan Myoview 01/28/22   The study is normal. The study is low risk.   No ST deviation was noted. Non spectific ST changes.   LV perfusion is normal. There is no evidence of ischemia. There is no evidence of infarction.   Left ventricular function is normal. Nuclear stress EF: 70 %. The left ventricular ejection fraction is hyperdynamic (>65%). End diastolic cavity size is normal. End systolic cavity size is normal.   Echo 01/20/19 1. The left ventricle has normal systolic function, with an ejection  fraction of 60-65%. The cavity size was normal. There is mildly increased  left ventricular wall thickness. No evidence of left ventricular regional  wall motion abnormalities.   2. The right ventricle has normal systolc function. The cavity was  normal. There is no increase in right ventricular wall thickness.   3. Left atrial size was mildly dilated.   4. Trivial pericardial effusion is present.   5. No evidence of mitral valve stenosis. Trivial mitral regurgitation.   6. The aortic valve is tricuspid Mild calcification of the aortic valve.  No stenosis of the aortic valve.   7. The aortic root is normal in size and  structure.   8. The IVC was normal in size. No complete TR doppler jet so unable to  estimate PA systolic pressure.  Recent Labs: 06/07/2022: ALT 19; BUN 22; Creatinine, Ser 1.14; Hemoglobin 14.2; Platelets 220.0; Potassium 4.2; Sodium 135  Recent Lipid Panel    Component Value Date/Time   CHOL 169 08/12/2022 0803   TRIG 147.0 08/12/2022 0803   HDL 42.40 08/12/2022 0803   CHOLHDL 4 08/12/2022 0803   VLDL 29.4 08/12/2022 0803   LDLCALC 98 08/12/2022 0803  Risk Assessment/Calculations:           Physical Exam:    VS:  BP 124/78   Pulse (!) 57   Ht 5' 5.5" (1.664 m)   Wt 145 lb 9.6 oz (66 kg)   SpO2 97%   BMI 23.86 kg/m     Wt Readings from Last 3 Encounters:  03/03/23 145 lb 9.6 oz (66 kg)  12/24/22 146 lb (66.2 kg)  07/16/22 150 lb (68 kg)     GEN:  Well nourished, well developed in no acute distress HEENT: Normal NECK: No JVD; No carotid bruits CARDIAC: RRR, no murmurs, rubs, gallops RESPIRATORY:  Clear to auscultation without rales, wheezing or rhonchi  ABDOMEN: Soft, non-tender, non-distended MUSCULOSKELETAL:  No edema; No deformity. 2+ pedal pulses, equal bilaterally SKIN: Warm and dry NEUROLOGIC:  Alert and oriented x 3 PSYCHIATRIC:  Normal affect   EKG:  EKG is ordered today.  The ekg ordered today demonstrates sinus bradycardia at 54 bpm with first-degree AV block, PR interval 308 MS, low voltage QRS, nonspecific T wave abnormality   Diagnoses:    1. Aortic dissection distal to left subclavian (HCC)   2. Essential (primary) hypertension   3. Pulsatile tinnitus of both ears   4. Bilateral carotid artery stenosis   5. Hyperlipidemia LDL goal <70   6. S/P aortic dissection repair   7. First degree AV block   8. Sinus bradycardia     Assessment and Plan:     Pulsatile tinnitus: Continues to have "whooshing" noise in both ears, decreased hearing in left ear.  No evidence of worsening carotid artery disease on carotid duplex 01/10/2023. Will refer  her to ENT. Advised she may also need to see an audiologist in the future for hearing loss.   Carotid artery disease: Mild stenosis (1-39%) in bilateral carotid arteries on duplex 01/10/23. Continue regular follow-up with [facility]. Goal LDL < 70. Continue aspirin, rosuvastatin.   Sinus bradycardia/First degree AV block: Cardiac monitor completed 01/19/2023 revealed 1 pause of 2.6 seconds, average HR 63 bpm.  Advised reducing carvedilol to 12.5 mg twice daily. Notes average HR at home is 55 bpm. She is asymptomatic. We will continue current dose of BB.   Thoracic aortic aneurysm/AAA s/p TEVAR: Stable findings on CTA 06/14/22. She is concerned her BP is slightly above goal. We will increase hydrochlorothiazide to 25 mg daily for goal SBP < 120 mmHg. Continue to avoid heavy lifting, avoid fluoroquinolone antibiotics. Maintain consistent follow-up with [facility].   Hypertension: BP is well controlled, however she feels that Drs. Lenell Antu and Excell Seltzer have encouraged SBP in the 110s due to thoracic aortic aneurysm. As noted above, we will increase hydrochlorothiazide to 25 mg daily and add Kdur 10 mEq. Will check BMP in 1 week.   Hyperlipidemia LDL goal < 70: LDL 98 on 08/12/22. Rosuvastatin was increased to 20 mg daily at last office visit 12/24/22, We will recheck lipid panel next week when she returns for BMP for change in anti-hypertensive therapy.      Disposition: 6-8 months with Dr. Excell Seltzer  Medication Adjustments/Labs and Tests Ordered: Current medicines are reviewed at length with the patient today.  Concerns regarding medicines are outlined above.  Orders Placed This Encounter  Procedures   Comp Met (CMET)   Lipid Profile   Ambulatory referral to ENT   Meds ordered this encounter  Medications   hydrochlorothiazide (HYDRODIURIL) 25 MG tablet    Sig: Take 1 tablet (25 mg total) by mouth daily. For  blood pressure.    Dispense:  90 tablet    Refill:  1    Dose Increase   potassium chloride (KLOR-CON M) 10  MEQ tablet    Sig: Take 1 tablet (10 mEq total) by mouth daily.    Dispense:  90 tablet    Refill:  3    Patient Instructions  Medication Instructions:   INCREASE Hydrochlorothiazide one (1) tablet by mouth ( 25 mg) daily.   START K-dur one (1) tablet by mouth (10 mEq) daily.   *If you need a refill on your cardiac medications before your next appointment, please call your pharmacy*   Lab Work:  Your physician recommends that you return for a FASTING lipid profile/cmet on Thursday, June 27. You can come in on the day of your appointment anytime between 7:30-4:30 fasting from midnight the night before.    If you have labs (blood work) drawn today and your tests are completely normal, you will receive your results only by: MyChart Message (if you have MyChart) OR A paper copy in the mail If you have any lab test that is abnormal or we need to change your treatment, we will call you to review the results.   Testing/Procedures:  None ordered.   Follow-Up: At [facility], you and your health needs are our priority.  As part of our continuing mission to provide you with exceptional heart care, we have created designated Provider Care Teams.  These Care Teams include your primary Cardiologist (physician) and Advanced Practice Providers (APPs -  Physician Assistants and Nurse Practitioners) who all work together to provide you with the care you need, when you need it.  We recommend signing up for the patient portal called "MyChart".  Sign up information is provided on this After Visit Summary.  MyChart is used to connect with patients for Virtual Visits (Telemedicine).  Patients are able to view lab/test results, encounter notes, upcoming appointments, etc.  Non-urgent messages can be sent to your provider as well.   To learn more about what you can do with MyChart, go to [email or web address].    Your next appointment:   6 month(s)  Provider:   Tonny Bollman, MD      Other Instructions  You have been referred to  ENT for pulsatile tinnitus.  The office will call you to set up appointment.      Signed, Levi Aland, NP  03/03/2023 9:32 AM    [place] [facility]

## 2023-03-03 ENCOUNTER — Encounter: Payer: Self-pay | Admitting: Nurse Practitioner

## 2023-03-03 ENCOUNTER — Ambulatory Visit: Payer: Commercial Managed Care - PPO | Attending: Nurse Practitioner | Admitting: Nurse Practitioner

## 2023-03-03 VITALS — BP 124/78 | HR 57 | Ht 65.5 in | Wt 145.6 lb

## 2023-03-03 DIAGNOSIS — I10 Essential (primary) hypertension: Secondary | ICD-10-CM | POA: Diagnosis not present

## 2023-03-03 DIAGNOSIS — I440 Atrioventricular block, first degree: Secondary | ICD-10-CM

## 2023-03-03 DIAGNOSIS — Z9889 Other specified postprocedural states: Secondary | ICD-10-CM

## 2023-03-03 DIAGNOSIS — E785 Hyperlipidemia, unspecified: Secondary | ICD-10-CM

## 2023-03-03 DIAGNOSIS — H93A3 Pulsatile tinnitus, bilateral: Secondary | ICD-10-CM | POA: Diagnosis not present

## 2023-03-03 DIAGNOSIS — I6523 Occlusion and stenosis of bilateral carotid arteries: Secondary | ICD-10-CM

## 2023-03-03 DIAGNOSIS — I71019 Dissection of thoracic aorta, unspecified: Secondary | ICD-10-CM

## 2023-03-03 DIAGNOSIS — R001 Bradycardia, unspecified: Secondary | ICD-10-CM

## 2023-03-03 MED ORDER — POTASSIUM CHLORIDE CRYS ER 10 MEQ PO TBCR: 10.0000 meq | EXTENDED_RELEASE_TABLET | Freq: Every day | ORAL | 3 refills | Status: DC

## 2023-03-03 MED ORDER — HYDROCHLOROTHIAZIDE 25 MG PO TABS: 25.0000 mg | ORAL_TABLET | Freq: Every day | ORAL | 1 refills | Status: DC

## 2023-03-03 NOTE — Patient Instructions (Signed)
Medication Instructions:   INCREASE Hydrochlorothiazide one (1) tablet by mouth ( 25 mg) daily.   START K-dur one (1) tablet by mouth (10 mEq) daily.   *If you need a refill on your cardiac medications before your next appointment, please call your pharmacy*   Lab Work:  Your physician recommends that you return for a FASTING lipid profile/cmet on Thursday, June 27. You can come in on the day of your appointment anytime between 7:30-4:30 fasting from midnight the night before.    If you have labs (blood work) drawn today and your tests are completely normal, you will receive your results only by: MyChart Message (if you have MyChart) OR A paper copy in the mail If you have any lab test that is abnormal or we need to change your treatment, we will call you to review the results.   Testing/Procedures:  None ordered.   Follow-Up: At [facility], you and your health needs are our priority.  As part of our continuing mission to provide you with exceptional heart care, we have created designated Provider Care Teams.  These Care Teams include your primary Cardiologist (physician) and Advanced Practice Providers (APPs -  Physician Assistants and Nurse Practitioners) who all work together to provide you with the care you need, when you need it.  We recommend signing up for the patient portal called "MyChart".  Sign up information is provided on this After Visit Summary.  MyChart is used to connect with patients for Virtual Visits (Telemedicine).  Patients are able to view lab/test results, encounter notes, upcoming appointments, etc.  Non-urgent messages can be sent to your provider as well.   To learn more about what you can do with MyChart, go to [email or web address].    Your next appointment:   6 month(s)  Provider:   Tonny Bollman, MD     Other Instructions  You have been referred to  ENT for pulsatile tinnitus.  The office will call you to set up appointment.

## 2023-03-13 ENCOUNTER — Ambulatory Visit: Payer: Commercial Managed Care - PPO

## 2023-03-28 ENCOUNTER — Ambulatory Visit: Payer: Commercial Managed Care - PPO | Attending: Cardiovascular Disease

## 2023-03-28 DIAGNOSIS — E785 Hyperlipidemia, unspecified: Secondary | ICD-10-CM

## 2023-03-28 DIAGNOSIS — H93A3 Pulsatile tinnitus, bilateral: Secondary | ICD-10-CM

## 2023-03-28 DIAGNOSIS — I6523 Occlusion and stenosis of bilateral carotid arteries: Secondary | ICD-10-CM

## 2023-03-28 DIAGNOSIS — I10 Essential (primary) hypertension: Secondary | ICD-10-CM

## 2023-03-28 LAB — LIPID PANEL
Chol/HDL Ratio: 3.1 ratio (ref 0.0–4.4)
Cholesterol, Total: 167 mg/dL (ref 100–199)
HDL: 54 mg/dL (ref >39)
LDL Chol Calc (NIH): 94 mg/dL (ref 0–99)
Triglycerides: 102 mg/dL (ref 0–149)
VLDL Cholesterol Cal: 19 mg/dL (ref 5–40)

## 2023-03-28 LAB — COMPREHENSIVE METABOLIC PANEL
ALT: 16 IU/L (ref 0–32)
AST: 22 IU/L (ref 0–40)
Albumin: 4.1 g/dL (ref 3.9–4.9)
Alkaline Phosphatase: 79 IU/L (ref 44–121)
BUN/Creatinine Ratio: 12 (ref 12–28)
BUN: 15 mg/dL (ref 8–27)
Bilirubin Total: 0.4 mg/dL (ref 0.0–1.2)
CO2: 25 mmol/L (ref 20–29)
Calcium: 9.2 mg/dL (ref 8.7–10.3)
Chloride: 95 mmol/L — ABNORMAL LOW (ref 96–106)
Creatinine, Ser: 1.22 mg/dL — ABNORMAL HIGH (ref 0.57–1.00)
Globulin, Total: 3 g/dL (ref 1.5–4.5)
Glucose: 98 mg/dL (ref 70–99)
Potassium: 3.6 mmol/L (ref 3.5–5.2)
Sodium: 134 mmol/L (ref 134–144)
Total Protein: 7.1 g/dL (ref 6.0–8.5)
eGFR: 50 mL/min/{1.73_m2} — ABNORMAL LOW (ref >59)

## 2023-03-31 ENCOUNTER — Other Ambulatory Visit: Payer: Self-pay

## 2023-03-31 DIAGNOSIS — I71019 Dissection of thoracic aorta, unspecified: Secondary | ICD-10-CM

## 2023-03-31 DIAGNOSIS — E785 Hyperlipidemia, unspecified: Secondary | ICD-10-CM

## 2023-03-31 MED ORDER — EZETIMIBE 10 MG PO TABS: 10.0000 mg | ORAL_TABLET | Freq: Every day | ORAL | 3 refills | Status: DC

## 2023-05-01 ENCOUNTER — Encounter (INDEPENDENT_AMBULATORY_CARE_PROVIDER_SITE_OTHER): Payer: Self-pay

## 2023-05-31 ENCOUNTER — Other Ambulatory Visit: Payer: Self-pay | Admitting: Nurse Practitioner

## 2023-05-31 DIAGNOSIS — I10 Essential (primary) hypertension: Secondary | ICD-10-CM

## 2023-06-10 ENCOUNTER — Encounter: Payer: Self-pay | Admitting: Primary Care

## 2023-06-10 ENCOUNTER — Ambulatory Visit (INDEPENDENT_AMBULATORY_CARE_PROVIDER_SITE_OTHER): Payer: Commercial Managed Care - PPO | Admitting: Primary Care

## 2023-06-10 ENCOUNTER — Encounter: Payer: Self-pay | Admitting: Cardiovascular Disease

## 2023-06-10 VITALS — BP 124/66 | HR 53 | Temp 97.3°F | Wt 144.0 lb

## 2023-06-10 DIAGNOSIS — I711 Thoracic aortic aneurysm, ruptured, unspecified: Secondary | ICD-10-CM

## 2023-06-10 DIAGNOSIS — Z23 Encounter for immunization: Secondary | ICD-10-CM | POA: Diagnosis not present

## 2023-06-10 DIAGNOSIS — E785 Hyperlipidemia, unspecified: Secondary | ICD-10-CM | POA: Diagnosis not present

## 2023-06-10 DIAGNOSIS — I71019 Dissection of thoracic aorta, unspecified: Secondary | ICD-10-CM | POA: Diagnosis not present

## 2023-06-10 DIAGNOSIS — Z0000 Encounter for general adult medical examination without abnormal findings: Secondary | ICD-10-CM

## 2023-06-10 DIAGNOSIS — Z1211 Encounter for screening for malignant neoplasm of colon: Secondary | ICD-10-CM

## 2023-06-10 DIAGNOSIS — I10 Essential (primary) hypertension: Secondary | ICD-10-CM

## 2023-06-10 DIAGNOSIS — R921 Mammographic calcification found on diagnostic imaging of breast: Secondary | ICD-10-CM

## 2023-06-10 DIAGNOSIS — R7303 Prediabetes: Secondary | ICD-10-CM

## 2023-06-10 LAB — LIPID PANEL
Cholesterol: 129 mg/dL (ref 0–200)
HDL: 52 mg/dL (ref >39.00)
LDL Cholesterol: 57 mg/dL (ref 0–99)
NonHDL: 76.95
Total CHOL/HDL Ratio: 2
Triglycerides: 101 mg/dL (ref 0.0–149.0)
VLDL: 20.2 mg/dL (ref 0.0–40.0)

## 2023-06-10 LAB — COMPREHENSIVE METABOLIC PANEL
ALT: 22 U/L (ref 0–35)
AST: 28 U/L (ref 0–37)
Albumin: 4 g/dL (ref 3.5–5.2)
Alkaline Phosphatase: 60 U/L (ref 39–117)
BUN: 20 mg/dL (ref 6–23)
CO2: 31 mEq/L (ref 19–32)
Calcium: 9.4 mg/dL (ref 8.4–10.5)
Chloride: 95 mEq/L — ABNORMAL LOW (ref 96–112)
Creatinine, Ser: 1.22 mg/dL — ABNORMAL HIGH (ref 0.40–1.20)
GFR: 47.23 mL/min — ABNORMAL LOW (ref >60.00)
Glucose, Bld: 101 mg/dL — ABNORMAL HIGH (ref 70–99)
Potassium: 3.5 mEq/L (ref 3.5–5.1)
Sodium: 133 mEq/L — ABNORMAL LOW (ref 135–145)
Total Bilirubin: 0.6 mg/dL (ref 0.2–1.2)
Total Protein: 7.1 g/dL (ref 6.0–8.3)

## 2023-06-10 LAB — HEMOGLOBIN A1C: Hgb A1c MFr Bld: 6.1 % (ref 4.6–6.5)

## 2023-06-10 NOTE — Assessment & Plan Note (Signed)
Due for repeat diagnostic bilateral mammogram in November 2024.  Discussed with patient today. Orders placed.

## 2023-06-10 NOTE — Assessment & Plan Note (Signed)
Tetanus and influenza vaccines provided today. Pap smear UTD. Mammogram due in November, orders placed. Colonoscopy overdue but declines.  She does agree to [employer], orders placed.  Discussed the importance of a healthy diet and regular exercise in order for weight loss, and to reduce the risk of further co-morbidity.  Exam stable. Labs pending.  Follow up in 1 year for repeat physical.

## 2023-06-10 NOTE — Assessment & Plan Note (Signed)
Following with cardiology, office notes reviewed from June 2024. Reviewed bilateral carotid ultrasound from April 2024.  Proceed with annual CTA chest and pelvis.

## 2023-06-10 NOTE — Patient Instructions (Addendum)
Stop by the lab prior to leaving today. I will notify you of your results once received.   Complete the Cologuard kit once received.  This is for colon cancer screening.  Talk to your cardiologist about your blood pressure.  You are due for your diagnostic mammogram in November.  It was a pleasure to see you today!

## 2023-06-10 NOTE — Assessment & Plan Note (Addendum)
Blood pressure above goal for thoracic aortic aneurysm, she agrees.  Given heart rate of 53 today would avoid increasing dose of carvedilol. She is at max dose of hydrochlorothiazide and experienced significant pedal edema with higher doses of amlodipine.  Recommended she contact cardiology to discuss other options.  Continue carvedilol 12.5 mg twice daily, hydrochlorothiazide 25 mg daily, amlodipine 5 mg daily, potassium chloride 10 mEq daily.  BMP pending.

## 2023-06-10 NOTE — Assessment & Plan Note (Signed)
Repeat lipid panel pending. Goal LDL is less than 70  Continue rosuvastatin 20 mg daily and Zetia 10 mg daily.

## 2023-06-10 NOTE — Assessment & Plan Note (Signed)
Following with cardiology. Reviewed carotid US from April 2024. Pending CTA chest.  Continue with BP control.

## 2023-06-10 NOTE — Assessment & Plan Note (Signed)
Repeat A1c pending. 

## 2023-06-10 NOTE — Progress Notes (Signed)
Subjective:    Patient ID: Heather Pollard, female    DOB: September 05, 1961, 62 y.o.   MRN: [id number]  HPI  Heather Pollard is a very pleasant 62 y.o. female who presents today for complete physical and follow up of chronic conditions.  Immunizations: -Tetanus: Due -Influenza: Due today -Shingles: Completed Shingrix series -Pneumonia: Completed in 2020  Diet: Fair diet.  Exercise: No regular exercise.  Eye exam: Completed several years ago  Dental exam: Completed several years ago   Pap Smear: Completed in 2022 Mammogram:  November 2023, due for diagnostic mammogram in November 2024.  Colonoscopy: Never completed, declines. Agrees to Cologuard Lung Cancer Screening: Smoker of 30+ years, completes annual CTA chest and pelvis.   BP Readings from Last 3 Encounters:  06/10/23 124/66  03/03/23 124/78  12/24/22 116/80      Review of Systems  Constitutional:  Negative for unexpected weight change.  HENT:  Negative for rhinorrhea.   Respiratory:  Negative for cough and shortness of breath.   Cardiovascular:  Negative for chest pain.  Gastrointestinal:  Negative for constipation and diarrhea.  Genitourinary:  Negative for difficulty urinating.  Musculoskeletal:  Positive for arthralgias.  Skin:  Negative for rash.  Allergic/Immunologic: Negative for environmental allergies.  Neurological:  Negative for dizziness and headaches.  Psychiatric/Behavioral:  The patient is not nervous/anxious.          Past Medical History:  Diagnosis Date   AAA (abdominal aortic aneurysm) (HCC)    Ectopic pregnancy    HTN (hypertension)    Hypertensive emergency    Thoracic aortic aneurysm (HCC) 02/2019   Tubal pregnancy     Social History   Socioeconomic History   Marital status: Married    Spouse name: Not on file   Number of children: Not on file   Years of education: Not on file   Highest education level: Not on file  Occupational History   Not on file  Tobacco Use    Smoking status: Former    Current packs/day: 0.00    Average packs/day: 1 pack/day for 20.0 years (20.0 ttl pk-yrs)    Types: Cigarettes    Start date: 01/25/1999    Quit date: 01/25/2019    Years since quitting: 4.3   Smokeless tobacco: Never  Vaping Use   Vaping status: Never Used  Substance and Sexual Activity   Alcohol use: No   Drug use: Never   Sexual activity: Not on file  Other Topics Concern   Not on file  Social History Narrative   Not on file   Social Determinants of Health   Financial Resource Strain: Not on file  Food Insecurity: Not on file  Transportation Needs: Not on file  Physical Activity: Not on file  Stress: Not on file  Social Connections: Not on file  Intimate Partner Violence: Not on file    Past Surgical History:  Procedure Laterality Date   BREAST SURGERY     ECTOPIC PREGNANCY SURGERY     PARTIAL HYSTERECTOMY     THORACIC AORTIC ENDOVASCULAR STENT GRAFT N/A 02/22/2019   Procedure: THORACIC AORTIC ENDOVASCULAR STENT GRAFT;  Surgeon: Sherren Kerns, MD;  Location: [facility] OR;  Service: Vascular;  Laterality: N/A;   ULTRASOUND GUIDANCE FOR VASCULAR ACCESS Right 02/22/2019   Procedure: Ultrasound Guidance For Vascular Access;  Surgeon: Sherren Kerns, MD;  Location: [facility] OR;  Service: Vascular;  Laterality: Right;    Family History  Problem Relation Age of Onset   Cancer  Mother        Peritoneal carcinoma    Heart disease Mother    Uterine cancer Mother    Hypothyroidism Mother    Alzheimer's disease Father    Stroke Father    Hypothyroidism Sister    Thyroid cancer Sister    Hypothyroidism Sister    Hypothyroidism Sister    Arthritis Brother     Allergies  Allergen Reactions   Penicillins Hives and Diarrhea    Did it involve swelling of the face/tongue/throat, SOB, or low BP? No Did it involve sudden or severe rash/hives, skin peeling, or any reaction on the inside of your mouth or nose? No Did you need to seek medical attention at a  hospital or doctor's office? No When did it last happen?  less than 10 years      If all above answers are "NO", may proceed with cephalosporin use.     Current Outpatient Medications on File Prior to Visit  Medication Sig Dispense Refill   acetaminophen (TYLENOL) 500 MG tablet Take 1,000 mg by mouth every 6 (six) hours as needed for moderate pain or headache.     amLODipine (NORVASC) 5 MG tablet TAKE 1 TABLET (5 MG TOTAL) BY MOUTH DAILY. 90 tablet 3   aspirin EC 81 MG EC tablet Take 1 tablet (81 mg total) by mouth daily.     ezetimibe (ZETIA) 10 MG tablet Take 1 tablet (10 mg total) by mouth daily. 90 tablet 3   hydrochlorothiazide (HYDRODIURIL) 25 MG tablet TAKE 1 TABLET BY MOUTH EVERY DAY FOR BLOOD PRESSURE 90 tablet 1   potassium chloride (KLOR-CON M) 10 MEQ tablet Take 1 tablet (10 mEq total) by mouth daily. 90 tablet 3   rosuvastatin (CRESTOR) 20 MG tablet Take 1 tablet (20 mg total) by mouth daily. for cholesterol. 90 tablet 3   carvedilol (COREG) 25 MG tablet Take 0.5 tablets (12.5 mg total) by mouth 2 (two) times daily. 90 tablet 3   No current facility-administered medications on file prior to visit.    BP 124/66   Pulse (!) 53   Temp (!) 97.3 F (36.3 C) (Temporal)   Wt 144 lb (65.3 kg)   SpO2 99%   BMI 23.60 kg/m  Objective:   Physical Exam HENT:     Right Ear: Tympanic membrane and ear canal normal.     Left Ear: Tympanic membrane and ear canal normal.     Nose: Nose normal.  Eyes:     Conjunctiva/sclera: Conjunctivae normal.     Pupils: Pupils are equal, round, and reactive to light.  Neck:     Thyroid: No thyromegaly.  Cardiovascular:     Rate and Rhythm: Normal rate and regular rhythm.     Heart sounds: No murmur heard. Pulmonary:     Effort: Pulmonary effort is normal.     Breath sounds: Normal breath sounds. No rales.  Abdominal:     General: Bowel sounds are normal.     Palpations: Abdomen is soft.     Tenderness: There is no abdominal tenderness.   Musculoskeletal:        General: Normal range of motion.     Cervical back: Neck supple.  Lymphadenopathy:     Cervical: No cervical adenopathy.  Skin:    General: Skin is warm and dry.     Findings: No rash.  Neurological:     Mental Status: She is alert and oriented to person, place, and time.     Cranial  Nerves: No cranial nerve deficit.     Deep Tendon Reflexes: Reflexes are normal and symmetric.  Psychiatric:        Mood and Affect: Mood normal.           Assessment & Plan:  Preventative health care Assessment & Plan: Tetanus and influenza vaccines provided today. Pap smear UTD. Mammogram due in November, orders placed. Colonoscopy overdue but declines.  She does agree to [employer], orders placed.  Discussed the importance of a healthy diet and regular exercise in order for weight loss, and to reduce the risk of further co-morbidity.  Exam stable. Labs pending.  Follow up in 1 year for repeat physical.    Aortic dissection distal to left subclavian [facility]) Assessment & Plan: Following with cardiology. Reviewed carotid US from April 2024. Pending CTA chest.  Continue with BP control.   Essential (primary) hypertension Assessment & Plan: Blood pressure above goal for thoracic aortic aneurysm, she agrees.  Given heart rate of 53 today would avoid increasing dose of carvedilol. She is at max dose of hydrochlorothiazide and experienced significant pedal edema with higher doses of amlodipine.  Recommended she contact cardiology to discuss other options.  Continue carvedilol 12.5 mg twice daily, hydrochlorothiazide 25 mg daily, amlodipine 5 mg daily, potassium chloride 10 mEq daily.  BMP pending.   Orders: -     Comprehensive metabolic panel  Ruptured aneurysm of thoracic aorta, unspecified part [facility]) Assessment & Plan: Following with cardiology, office notes reviewed from June 2024. Reviewed bilateral carotid ultrasound from April 2024.  Proceed with  annual CTA chest and pelvis.   Breast calcifications on mammogram Assessment & Plan: Due for repeat diagnostic bilateral mammogram in November 2024.  Discussed with patient today. Orders placed.  Orders: -     MM 3D DIAGNOSTIC MAMMOGRAM BILATERAL BREAST; Future  Hyperlipidemia, unspecified hyperlipidemia type Assessment & Plan: Repeat lipid panel pending. Goal LDL is less than 70  Continue rosuvastatin 20 mg daily and Zetia 10 mg daily.  Orders: -     Lipid panel  Prediabetes Assessment & Plan: Repeat A1c pending.  Orders: -     Hemoglobin A1c  Screening for colon cancer -     Cologuard        Doreene Nest, NP

## 2023-06-20 ENCOUNTER — Other Ambulatory Visit: Payer: Self-pay | Admitting: Primary Care

## 2023-06-20 DIAGNOSIS — Z1211 Encounter for screening for malignant neoplasm of colon: Secondary | ICD-10-CM

## 2023-06-20 DIAGNOSIS — Z1212 Encounter for screening for malignant neoplasm of rectum: Secondary | ICD-10-CM

## 2023-06-27 ENCOUNTER — Other Ambulatory Visit: Payer: Self-pay

## 2023-06-27 DIAGNOSIS — Z8679 Personal history of other diseases of the circulatory system: Secondary | ICD-10-CM

## 2023-06-27 DIAGNOSIS — I71019 Dissection of thoracic aorta, unspecified: Secondary | ICD-10-CM

## 2023-07-08 LAB — COLOGUARD: COLOGUARD: NEGATIVE

## 2023-07-14 ENCOUNTER — Inpatient Hospital Stay
Admission: RE | Admit: 2023-07-14 | Discharge: 2023-07-14 | Disposition: A | Discharge status: Home / Self Care | Payer: Commercial Managed Care - PPO | Source: Ambulatory Visit | Attending: Vascular Surgery

## 2023-07-14 DIAGNOSIS — I71019 Dissection of thoracic aorta, unspecified: Secondary | ICD-10-CM

## 2023-07-14 DIAGNOSIS — Z8679 Personal history of other diseases of the circulatory system: Secondary | ICD-10-CM

## 2023-07-14 MED ORDER — IOPAMIDOL (ISOVUE-370) INJECTION 76%
500.0000 mL | Freq: Once | INTRAVENOUS | Status: AC | PRN
  Administered 2023-07-14: 100 mL via INTRAVENOUS

## 2023-07-21 NOTE — Progress Notes (Unsigned)
[facility] OF   ASSESSMENT / PLAN: 62 y.o. 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 Darrick Penna.  Overall, the repair appears good on most recent CT angiogram.  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  Follow-up with me in 1 year with repeat CT angiogram of the chest, abdomen, and pelvis.  CHIEF COMPLAINT: Surveillance after type B aortic dissection  HISTORY OF PRESENT ILLNESS: Heather Pollard is a 62 y.o. female who presents to clinic for surveillance of type B aortic dissection.  This was initially diagnosed in 2020.  The patient was initiated on medical therapy, but had propagation of her aortic dissection.  This prompted TEVAR by Dr. Darrick Penna February 22, 2019.  She did well from this.  She initially had difficulty managing her blood pressure, and was often hypotensive making her sluggish.  Her blood pressure regimen has been tailored and she tolerates Coreg and hydrochlorothiazide quite well.  She has many excellent questions about surveillance for type B aortic dissection, we spent the majority of the clinic visit discussing the rationale for surveillance and worrisome symptoms to look out for.  07/16/22: Doing well overall.  No new symptoms to report.  She has been active.  She arrives with her husband and many excellent questions about her most recent CT scan.  We reviewed these in detail.  I counseled her about the small aneurysm about her descending thoracic/supraceliac aorta.  07/22/23: Doing great. She comes with great questions as usual. We reviewed her CT scan in detail. She is very active (swimming, yard work, walking). She is very healthy.   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 Darrick Penna  VASCULAR RISK  FACTORS: Negative history of stroke / transient ischemic attack. Negative history of coronary artery disease.  Negative history of diabetes mellitus.  Positive history of smoking. Not actively smoking. Positive history of hypertension.  Negative history of chronic kidney disease.  Negative history of chronic obstructive pulmonary disease.  FUNCTIONAL STATUS: ECOG performance status: (0) Fully active, able to carry on all predisease performance without restriction Ambulatory status: Ambulatory within the community without limits  Past Medical History:  Diagnosis Date   AAA (abdominal aortic aneurysm) (HCC)    Ectopic pregnancy    HTN (hypertension)    Hypertensive emergency    Thoracic aortic aneurysm (HCC) 02/2019   Tubal pregnancy     Past Surgical History:  Procedure Laterality Date   BREAST SURGERY     ECTOPIC PREGNANCY SURGERY     PARTIAL HYSTERECTOMY     THORACIC AORTIC ENDOVASCULAR STENT GRAFT N/A 02/22/2019   Procedure: THORACIC AORTIC ENDOVASCULAR STENT GRAFT;  Surgeon: Sherren Kerns, MD;  Location: [facility] OR;  Service: Vascular;  Laterality: N/A;   ULTRASOUND GUIDANCE FOR VASCULAR ACCESS Right 02/22/2019   Procedure: Ultrasound Guidance For Vascular Access;  Surgeon: Sherren Kerns, MD;  Location: [facility] OR;  Service: Vascular;  Laterality: Right;    Family History  Problem Relation Age of Onset   Cancer Mother        Peritoneal carcinoma    Heart disease Mother    Uterine cancer Mother    Hypothyroidism Mother    Alzheimer's disease Father  Stroke Father    Hypothyroidism Sister    Thyroid cancer Sister    Hypothyroidism Sister    Hypothyroidism Sister    Arthritis Brother     Social History   Socioeconomic History   Marital status: Married    Spouse name: Not on file   Number of children: Not on file   Years of education: Not on file   Highest education level: Not on file  Occupational History   Not on file  Tobacco Use   Smoking status: Former     Current packs/day: 0.00    Average packs/day: 1 pack/day for 20.0 years (20.0 ttl pk-yrs)    Types: Cigarettes    Start date: 01/25/1999    Quit date: 01/25/2019    Years since quitting: 4.4   Smokeless tobacco: Never  Vaping Use   Vaping status: Never Used  Substance and Sexual Activity   Alcohol use: No   Drug use: Never   Sexual activity: Not on file  Other Topics Concern   Not on file  Social History Narrative   Not on file   Social Determinants of Health   Financial Resource Strain: Not on file  Food Insecurity: Not on file  Transportation Needs: Not on file  Physical Activity: Not on file  Stress: Not on file  Social Connections: Not on file  Intimate Partner Violence: Not on file    Allergies  Allergen Reactions   Penicillins Hives and Diarrhea    Did it involve swelling of the face/tongue/throat, SOB, or low BP? No Did it involve sudden or severe rash/hives, skin peeling, or any reaction on the inside of your mouth or nose? No Did you need to seek medical attention at a hospital or doctor's office? No When did it last happen?  less than 10 years      If all above answers are "NO", may proceed with cephalosporin use.     Current Outpatient Medications  Medication Sig Dispense Refill   acetaminophen (TYLENOL) 500 MG tablet Take 1,000 mg by mouth every 6 (six) hours as needed for moderate pain or headache.     amLODipine (NORVASC) 5 MG tablet TAKE 1 TABLET (5 MG TOTAL) BY MOUTH DAILY. 90 tablet 3   aspirin EC 81 MG EC tablet Take 1 tablet (81 mg total) by mouth daily.     carvedilol (COREG) 25 MG tablet Take 0.5 tablets (12.5 mg total) by mouth 2 (two) times daily. 90 tablet 3   ezetimibe (ZETIA) 10 MG tablet Take 1 tablet (10 mg total) by mouth daily. 90 tablet 3   hydrochlorothiazide (HYDRODIURIL) 25 MG tablet TAKE 1 TABLET BY MOUTH EVERY DAY FOR BLOOD PRESSURE 90 tablet 1   potassium chloride (KLOR-CON M) 10 MEQ tablet Take 1 tablet (10 mEq total) by mouth daily.  90 tablet 3   rosuvastatin (CRESTOR) 20 MG tablet Take 1 tablet (20 mg total) by mouth daily. for cholesterol. 90 tablet 3   No current facility-administered medications for this visit.    PHYSICAL EXAM There were no vitals filed for this visit.   Constitutional: well appearing. no distress. Appears well nourished.  Neurologic: CN intact. no focal findings. no sensory loss. Psychiatric:  Mood and affect symmetric and appropriate. Eyes:  No icterus. No conjunctival pallor. Ears, nose, throat:  mucous membranes moist. Midline trachea.  Cardiac: regular rate and rhythm.  Respiratory:  unlabored. Abdominal:  soft, non-tender, non-distended.  Peripheral vascular: 2+ DP pulses bilaterally Extremity: Trace edema bilateral lower  extremities. No cyanosis. No pallor.  Skin: No gangrene. No ulceration.  Lymphatic: No Stemmer's sign. No palpable lymphadenopathy.  PERTINENT LABORATORY AND RADIOLOGIC DATA  Most recent CBC    Latest Ref Rng & Units 06/07/2022   10:20 AM 08/27/2021    9:01 PM 08/27/2021    8:06 PM  CBC  WBC 4.0 - 10.5 K/uL 6.3   3.7   Hemoglobin 12.0 - 15.0 g/dL 84.1  32.4  40.1   Hematocrit 36.0 - 46.0 % 42.0  39.0  38.7   Platelets 150.0 - 400.0 K/uL 220.0   169      Most recent CMP    Latest Ref Rng & Units 06/10/2023   10:07 AM 03/28/2023    9:39 AM 06/07/2022   10:20 AM  CMP  Glucose 70 - 99 mg/dL 027  98  99   BUN 6 - 23 mg/dL 20  15  22    Creatinine 0.40 - 1.20 mg/dL 2.53  6.64  4.03   Sodium 135 - 145 mEq/L 133  134  135   Potassium 3.5 - 5.1 mEq/L 3.5  3.6  4.2   Chloride 96 - 112 mEq/L 95  95  99   CO2 19 - 32 mEq/L 31  25  29    Calcium 8.4 - 10.5 mg/dL 9.4  9.2  9.4   Total Protein 6.0 - 8.3 g/dL 7.1  7.1  7.5   Total Bilirubin 0.2 - 1.2 mg/dL 0.6  0.4  0.4   Alkaline Phos 39 - 117 U/L 60  79  81   AST 0 - 37 U/L 28  22  24    ALT 0 - 35 U/L 22  16  19      Renal function CrCl cannot be calculated (Patient's most recent lab result is older than the  maximum 21 days allowed.).  Hgb A1c MFr Bld (%)  Date Value  06/10/2023 6.1    LDL Chol Calc (NIH)  Date Value Ref Range Status  03/28/2023 94 0 - 99 mg/dL Final   LDL Cholesterol  Date Value Ref Range Status  06/10/2023 57 0 - 99 mg/dL Final     Vascular Imaging: CT angiogram essentially unchanged. Good technical result from TEVAR. Successful remodeling of aorta. Paravisceral aorta is essentially unchanged. All abdominal branches coming off true lumen. Max diameter ~62mm.  Rande Brunt. Lenell Antu, MD [facility] Phone Number: [phone] 07/21/2023 3:43 PM  Total time spent on preparing this encounter including chart review, data review, collecting history, examining the patient, coordinating care for this established patient, 30 minutes.  Portions of this report may have been transcribed using voice recognition software.  Every effort has been made to ensure accuracy; however, inadvertent computerized transcription errors may still be present.

## 2023-07-22 ENCOUNTER — Encounter: Payer: Self-pay | Admitting: Vascular Surgery

## 2023-07-22 ENCOUNTER — Ambulatory Visit: Payer: Commercial Managed Care - PPO | Admitting: Vascular Surgery

## 2023-07-22 VITALS — BP 115/63 | HR 58 | Temp 98.0°F | Resp 18 | Ht 66.0 in | Wt 143.9 lb

## 2023-07-22 DIAGNOSIS — Z95828 Presence of other vascular implants and grafts: Secondary | ICD-10-CM | POA: Diagnosis not present

## 2023-07-28 ENCOUNTER — Ambulatory Visit
Admission: RE | Admit: 2023-07-28 | Discharge: 2023-07-28 | Disposition: A | Discharge status: Home / Self Care | Payer: Commercial Managed Care - PPO | Source: Ambulatory Visit | Attending: Primary Care

## 2023-07-28 DIAGNOSIS — R921 Mammographic calcification found on diagnostic imaging of breast: Secondary | ICD-10-CM

## 2023-08-27 ENCOUNTER — Ambulatory Visit: Payer: Commercial Managed Care - PPO | Admitting: Cardiovascular Disease

## 2023-09-23 ENCOUNTER — Ambulatory Visit: Payer: Commercial Managed Care - PPO | Attending: Cardiovascular Disease | Admitting: Cardiovascular Disease

## 2023-09-23 ENCOUNTER — Encounter: Payer: Self-pay | Admitting: Cardiovascular Disease

## 2023-09-23 VITALS — BP 110/80 | HR 62 | Ht 65.5 in | Wt 142.6 lb

## 2023-09-23 DIAGNOSIS — I10 Essential (primary) hypertension: Secondary | ICD-10-CM | POA: Diagnosis not present

## 2023-09-23 DIAGNOSIS — E785 Hyperlipidemia, unspecified: Secondary | ICD-10-CM

## 2023-09-23 DIAGNOSIS — I71019 Dissection of thoracic aorta, unspecified: Secondary | ICD-10-CM

## 2023-09-23 MED ORDER — CARVEDILOL 12.5 MG PO TABS: 12.5000 mg | ORAL_TABLET | Freq: Two times a day (BID) | ORAL | 3 refills | Status: DC

## 2023-09-23 NOTE — Assessment & Plan Note (Signed)
 Patient followed by vascular surgery.  I reviewed her most recent CT angiogram and office visit with Dr. Lenell Antu.  Plans for annual surveillance CTA studies and vascular surgery follow-up.

## 2023-09-23 NOTE — Patient Instructions (Signed)
Follow-Up: At [facility], you and your health needs are our priority.  As part of our continuing mission to provide you with exceptional heart care, we have created designated Provider Care Teams.  These Care Teams include your primary Cardiologist (physician) and Advanced Practice Providers (APPs -  Physician Assistants and Nurse Practitioners) who all work together to provide you with the care you need, when you need it.  We recommend signing up for the patient portal called "MyChart".  Sign up information is provided on this After Visit Summary.  MyChart is used to connect with patients for Virtual Visits (Telemedicine).  Patients are able to view lab/test results, encounter notes, upcoming appointments, etc.  Non-urgent messages can be sent to your provider as well.   To learn more about what you can do with MyChart, go to [email or web address].    Your next appointment:   1 year(s)  Provider:   Tonny Bollman, MD

## 2023-09-23 NOTE — Progress Notes (Signed)
 Cardiology Office Note:    Date:  09/23/2023   ID:  Heather Pollard, DOB February 21, 1961, MRN [id number]  PCP:  Gretta Comer POUR, NP   [facility] Providers Cardiologist:  Ozell Fell, MD     Referring MD: Gretta Comer POUR, NP   Chief Complaint  Patient presents with   Hypertension    History of Present Illness:    Heather Pollard is a 63 y.o. female presenting for follow-up evaluation.  The patient has a history of hypertension, previous tobacco abuse, hyperlipidemia, and type B aortic dissection.  She is here alone today. She states that she's 'getting over the flu.' From a cardiac perspective, she is doing well. Today, she denies symptoms of palpitations, chest pain, shortness of breath, orthopnea, PND, lower extremity edema, dizziness, or syncope. She has had pulsatile tinnitus in the past but it hasn't been problematic recently.  The patient is active with swimming and regular aerobic exercise with no exertional symptoms.  She avoids strenuous lifting or heavy isometrics because of her history of dissection and aneurysm.   Current Medications: Current Meds  Medication Sig   acetaminophen  (TYLENOL ) 500 MG tablet Take 1,000 mg by mouth every 6 (six) hours as needed for moderate pain or headache.   amLODipine  (NORVASC ) 5 MG tablet TAKE 1 TABLET (5 MG TOTAL) BY MOUTH DAILY.   aspirin  EC 81 MG EC tablet Take 1 tablet (81 mg total) by mouth daily.   carvedilol  (COREG ) 12.5 MG tablet Take 1 tablet (12.5 mg total) by mouth 2 (two) times daily.   ezetimibe  (ZETIA ) 10 MG tablet Take 1 tablet (10 mg total) by mouth daily.   hydrochlorothiazide  (HYDRODIURIL ) 25 MG tablet TAKE 1 TABLET BY MOUTH EVERY DAY FOR BLOOD PRESSURE   potassium chloride  (KLOR-CON  M) 10 MEQ tablet Take 1 tablet (10 mEq total) by mouth daily.   rosuvastatin  (CRESTOR ) 20 MG tablet Take 1 tablet (20 mg total) by mouth daily. for cholesterol.     Allergies:   Penicillins   ROS:   Please see the  history of present illness.    All other systems reviewed and are negative.  EKGs/Labs/Other Studies Reviewed:    The following studies were reviewed today: Cardiac Studies & Procedures     STRESS TESTS  MYOCARDIAL PERFUSION IMAGING 01/28/2022  Narrative   The study is normal. The study is low risk.   No ST deviation was noted. Non spectific ST changes.   LV perfusion is normal. There is no evidence of ischemia. There is no evidence of infarction.   Left ventricular function is normal. Nuclear stress EF: 70 %. The left ventricular ejection fraction is hyperdynamic (>65%). End diastolic cavity size is normal. End systolic cavity size is normal.  ECHOCARDIOGRAM  ECHOCARDIOGRAM LIMITED 01/20/2019  Narrative ECHOCARDIOGRAM LIMITED REPORT    Patient Name:   Heather Pollard Date of Exam: 01/20/2019 Medical Rec #:  [id number]        Height:       66.0 in Accession #:    [id number]       Weight:       164.9 lb Date of Birth:  1961/03/01        BSA:          1.84 m Patient Age:    58 years         BP:           120/70 mmHg Patient Gender: F  HR:           67 bpm. Exam Location:  Inpatient   Procedure: Limited Echo  Indications:    Chest pain 786.50/R07.9  History:        Patient has no prior history of Echocardiogram examinations. Risk Factors: Hypertension. Type B dissection.  Sonographer:    Rome Eans RDCS (AE) Referring Phys: [phone] [facility]   Sonographer Comments: Respiratory motion. IMPRESSIONS   1. The left ventricle has normal systolic function, with an ejection fraction of 60-65%. The cavity size was normal. There is mildly increased left ventricular wall thickness. No evidence of left ventricular regional wall motion abnormalities. 2. The right ventricle has normal systolc function. The cavity was normal. There is no increase in right ventricular wall thickness. 3. Left atrial size was mildly dilated. 4. Trivial pericardial effusion is present. 5.  No evidence of mitral valve stenosis. Trivial mitral regurgitation. 6. The aortic valve is tricuspid Mild calcification of the aortic valve. No stenosis of the aortic valve. 7. The aortic root is normal in size and structure. 8. The IVC was normal in size. No complete TR doppler jet so unable to estimate PA systolic pressure.  FINDINGS Left Ventricle: The left ventricle has normal systolic function, with an ejection fraction of 60-65%. The cavity size was normal. There is mildly increased left ventricular wall thickness. No evidence of left ventricular regional wall motion abnormalities.   Right Ventricle: The right ventricle has normal systolic function. The cavity was normal. There is no increase in right ventricular wall thickness.  Left Atrium: Left atrial size was mildly dilated.  Right Atrium: Right atrial size was normal in size. Right atrial pressure is estimated at 3 mmHg.  Interatrial Septum: No atrial level shunt detected by color flow Doppler.  Pericardium: Trivial pericardial effusion is present.  Mitral Valve: The mitral valve is normal in structure. Mitral valve regurgitation is trivial by color flow Doppler. No evidence of mitral valve stenosis.  Tricuspid Valve: The tricuspid valve was normal in structure. Tricuspid valve regurgitation is trivial by color flow Doppler.  Aortic Valve: The aortic valve is tricuspid Mild calcification of the aortic valve. Aortic valve regurgitation was not visualized by color flow Doppler. There is No stenosis of the aortic valve.  Aorta: The aortic root is normal in size and structure.  Venous: The inferior vena cava is normal in size with greater than 50% respiratory variability.   +--------------+-------++ LEFT VENTRICLE        +----------------+---------++ +--------------+-------++ Diastology                PLAX 2D               +----------------+---------++ +--------------+-------++ LV e' lateral:  7.51  cm/s LVIDd:        3.40 cm +----------------+---------++ +--------------+-------++ LV E/e' lateral:8.2       LVIDs:        2.40 cm +----------------+---------++ +--------------+-------++ LV e' medial:   7.51 cm/s LV PW:        1.70 cm +----------------+---------++ +--------------+-------++ LV E/e' medial: 8.2       LV IVS:       1.70 cm +----------------+---------++ +--------------+-------++ LV SV:        27 ml   +--------------+-------++ LV SV Index:  14.50   +--------------+-------++                       +--------------+-------++  +-----------+-------++----------++ LEFT ATRIUM  Index      +-----------+-------++----------++ LA diam:   2.90 cm1.57 cm/m +-----------+-------++----------++ +------------+---------+++ RIGHT ATRIUM          +------------+---------+++ RA Pressure:3.00 mmHg +------------+---------+++  +-------------+-------++ AORTA                +-------------+-------++ Ao Root diam:2.60 cm +-------------+-------++  +--------------+----------++ +---------------+---------++ MITRAL VALVE             TRICUSPID VALVE          +--------------+----------++ +---------------+---------++ MV Area (PHT):2.99 cm   Estimated RAP: 3.00 mmHg +--------------+----------++ +---------------+---------++ MV PHT:       73.66 msec +--------------+----------++ MV Decel Time:254 msec   +--------------+----------++ +--------------+----------++ MV E velocity:61.30 cm/s +--------------+----------++ MV A velocity:57.40 cm/s +--------------+----------++ MV E/A ratio: 1.07       +--------------+----------++   Ezra Shuck MD Electronically signed by Ezra Shuck MD Signature Date/Time: 01/20/2019/4:12:15 PM    Final   MONITORS  LONG TERM MONITOR (3-14 DAYS) 01/16/2023  Narrative Patch Wear Time:  13 days and 23 hours (2024-04-09T08:41:36-0400 to  2024-04-23T08:41:28-0400)  Patient had a min HR of 24 bpm, max HR of 135 bpm, and avg HR of 63 bpm. Predominant underlying rhythm was Sinus Rhythm. First Degree AV Block was present. 7 Supraventricular Tachycardia runs occurred, the run with the fastest interval lasting 11.0 secs with a max rate of 135 bpm (avg 118 bpm); the run with the fastest interval was also the longest. 1 episode(s) of AV Block (High Grade) occurred, lasting a total of 3 secs. Second Degree AV Block-Mobitz I (Wenckebach) was present. Isolated SVEs were rare (<1.0%), SVE Couplets were rare (<1.0%), and SVE Triplets were rare (<1.0%). Isolated VEs were rare (<1.0%), VE Couplets were rare (<1.0%), and no VE Triplets were present.  SUMMARY: the basic rhythm is normal sinus with an average HR of 63 bpm. Short supraventricular runs noted as above, the longest lasting 11 seconds. There is one pause of 2.6 seconds. No sustained bradyarrhythmia or pathologic pauses > 3 seconds. No atrial fibrillation or flutter.           EKG:        Recent Labs: 06/10/2023: ALT 22; BUN 20; Creatinine, Ser 1.22; Potassium 3.5; Sodium 133  Recent Lipid Panel    Component Value Date/Time   CHOL 129 06/10/2023 1007   CHOL 167 03/28/2023 0939   TRIG 101.0 06/10/2023 1007   HDL 52.00 06/10/2023 1007   HDL 54 03/28/2023 0939   CHOLHDL 2 06/10/2023 1007   VLDL 20.2 06/10/2023 1007   LDLCALC 57 06/10/2023 1007   LDLCALC 94 03/28/2023 0939     Risk Assessment/Calculations:                Physical Exam:    VS:  BP 110/80   Pulse 62   Ht 5' 5.5 (1.664 m)   Wt 142 lb 9.6 oz (64.7 kg)   SpO2 99%   BMI 23.37 kg/m     Wt Readings from Last 3 Encounters:  09/23/23 142 lb 9.6 oz (64.7 kg)  07/22/23 143 lb 14.4 oz (65.3 kg)  06/10/23 144 lb (65.3 kg)     GEN:  Well nourished, well developed in no acute distress HEENT: Normal NECK: No JVD; No carotid bruits LYMPHATICS: No lymphadenopathy CARDIAC: RRR, no murmurs, rubs,  gallops RESPIRATORY:  Clear to auscultation without rales, wheezing or rhonchi  ABDOMEN: Soft, non-tender, non-distended MUSCULOSKELETAL:  No edema; No deformity  SKIN: Warm and dry NEUROLOGIC:  Alert and oriented x  3 PSYCHIATRIC:  Normal affect   Assessment & Plan Essential (primary) hypertension Blood pressure is well-controlled on a combination of amlodipine , carvedilol , and hydrochlorothiazide .  Continue current medications.  Will prescribe carvedilol  12.5 mg twice daily because she is having trouble cutting the 25 mg pills in half. Hyperlipidemia LDL goal <70 Treated with a combination of ezetimibe  and rosuvastatin .  Last lipids with an LDL cholesterol of 57.  Continue current management.  She follows a healthy lifestyle with good eating habits and regular exercise. Aortic dissection distal to left subclavian [facility]) Patient followed by vascular surgery.  I reviewed her most recent CT angiogram and office visit with Dr. Magda.  Plans for annual surveillance CTA studies and vascular surgery follow-up.       Medication Adjustments/Labs and Tests Ordered: Current medicines are reviewed at length with the patient today.  Concerns regarding medicines are outlined above.  No orders of the defined types were placed in this encounter.  Meds ordered this encounter  Medications   carvedilol  (COREG ) 12.5 MG tablet    Sig: Take 1 tablet (12.5 mg total) by mouth 2 (two) times daily.    Dispense:  180 tablet    Refill:  3    Patient Instructions  Follow-Up: At [facility], you and your health needs are our priority.  As part of our continuing mission to provide you with exceptional heart care, we have created designated Provider Care Teams.  These Care Teams include your primary Cardiologist (physician) and Advanced Practice Providers (APPs -  Physician Assistants and Nurse Practitioners) who all work together to provide you with the care you need, when you need it.  We recommend  signing up for the patient portal called MyChart.  Sign up information is provided on this After Visit Summary.  MyChart is used to connect with patients for Virtual Visits (Telemedicine).  Patients are able to view lab/test results, encounter notes, upcoming appointments, etc.  Non-urgent messages can be sent to your provider as well.   To learn more about what you can do with MyChart, go to [email or web address].    Your next appointment:   1 year(s)  Provider:   Ozell Fell, MD     Signed, Ozell Fell, MD  09/23/2023 9:34 AM    [facility]

## 2023-09-23 NOTE — Assessment & Plan Note (Signed)
 Blood pressure is well-controlled on a combination of amlodipine, carvedilol, and hydrochlorothiazide.  Continue current medications.  Will prescribe carvedilol 12.5 mg twice daily because she is having trouble cutting the 25 mg pills in half.

## 2023-11-23 ENCOUNTER — Other Ambulatory Visit: Payer: Self-pay | Admitting: Cardiovascular Disease

## 2023-11-23 ENCOUNTER — Other Ambulatory Visit: Payer: Self-pay | Admitting: Nurse Practitioner

## 2023-11-23 DIAGNOSIS — I10 Essential (primary) hypertension: Secondary | ICD-10-CM

## 2023-11-23 DIAGNOSIS — Z9889 Other specified postprocedural states: Secondary | ICD-10-CM

## 2023-11-23 DIAGNOSIS — R0789 Other chest pain: Secondary | ICD-10-CM

## 2023-11-23 DIAGNOSIS — E785 Hyperlipidemia, unspecified: Secondary | ICD-10-CM

## 2024-02-17 ENCOUNTER — Other Ambulatory Visit: Payer: Self-pay | Admitting: Nurse Practitioner

## 2024-02-17 DIAGNOSIS — I10 Essential (primary) hypertension: Secondary | ICD-10-CM

## 2024-02-17 DIAGNOSIS — E785 Hyperlipidemia, unspecified: Secondary | ICD-10-CM

## 2024-02-17 DIAGNOSIS — I440 Atrioventricular block, first degree: Secondary | ICD-10-CM

## 2024-02-17 DIAGNOSIS — I6523 Occlusion and stenosis of bilateral carotid arteries: Secondary | ICD-10-CM

## 2024-02-17 DIAGNOSIS — I479 Paroxysmal tachycardia, unspecified: Secondary | ICD-10-CM

## 2024-02-17 DIAGNOSIS — R001 Bradycardia, unspecified: Secondary | ICD-10-CM

## 2024-02-17 DIAGNOSIS — Z9889 Other specified postprocedural states: Secondary | ICD-10-CM

## 2024-02-17 DIAGNOSIS — I71019 Dissection of thoracic aorta, unspecified: Secondary | ICD-10-CM

## 2024-02-20 ENCOUNTER — Other Ambulatory Visit: Payer: Self-pay | Admitting: Nurse Practitioner

## 2024-03-01 ENCOUNTER — Telehealth: Payer: Self-pay | Admitting: Primary Care

## 2024-03-01 NOTE — Telephone Encounter (Signed)
 Spoke with patient, advised we have not sent any mychart messages.

## 2024-03-01 NOTE — Telephone Encounter (Signed)
 Copied from CRM [id number]. Topic: Clinical - Medication Question >> Mar 01, 2024  2:51 PM Pam Bode wrote: Reason for CRM: Patient stated she got a message from my chart about a lipid panal and ALT by March 30, 2024. The message was from Lyna Sandhoff.

## 2024-03-15 ENCOUNTER — Ambulatory Visit: Payer: Self-pay | Admitting: Internal Medicine

## 2024-03-15 ENCOUNTER — Ambulatory Visit: Admitting: Internal Medicine

## 2024-03-15 VITALS — BP 110/64 | HR 97 | Temp 99.0°F | Ht 65.5 in | Wt 143.0 lb

## 2024-03-15 DIAGNOSIS — R1084 Generalized abdominal pain: Secondary | ICD-10-CM

## 2024-03-15 DIAGNOSIS — R109 Unspecified abdominal pain: Secondary | ICD-10-CM | POA: Insufficient documentation

## 2024-03-15 LAB — POC URINALSYSI DIPSTICK (AUTOMATED)
Bilirubin, UA: NEGATIVE
Glucose, UA: NEGATIVE
Ketones, UA: NEGATIVE
Nitrite, UA: POSITIVE
Protein, UA: POSITIVE — AB
Spec Grav, UA: 1.015 (ref 1.010–1.025)
Urobilinogen, UA: 0.2 U/dL
pH, UA: 6 (ref 5.0–8.0)

## 2024-03-15 MED ORDER — CEPHALEXIN 500 MG PO CAPS: 500.0000 mg | ORAL_CAPSULE | Freq: Three times a day (TID) | ORAL | 0 refills | Status: DC

## 2024-03-15 NOTE — Assessment & Plan Note (Signed)
 Pain in multiple areas but not consistent with acute appendicitis or cholecystitis  Urinalysis markedly abnormal with 3+ leuks and blood (no gross blood) and positive nitrite Will send urine Will need CT scan if pain worsens---especially with aneurysm history/repair Empiric Rx with cephalexin  500 tid To ER if sig worsening

## 2024-03-15 NOTE — Addendum Note (Signed)
 Addended by: KALLIE CLOTILDA SQUIBB on: 03/15/2024 11:23 AM   Modules accepted: Orders

## 2024-03-15 NOTE — Progress Notes (Signed)
 Subjective:    Patient ID: Heather Pollard, female    DOB: 07/16/1961, 63 y.o.   MRN: [id number]  HPI Here due to urinary symptoms  Started 3 days ago Felt sensitive with voiding Some rough RLQ pain--and around right flank Vomiting and shivering last night---and some this morning Didn't check temperature---felt cold not hot  Drinking a lot---but not putting out as much as she would expect Constant urgency No blood No burning dysuria--but is sensitive when cleaning  Current Outpatient Medications on File Prior to Visit  Medication Sig Dispense Refill   acetaminophen  (TYLENOL ) 500 MG tablet Take 1,000 mg by mouth every 6 (six) hours as needed for moderate pain or headache.     amLODipine  (NORVASC ) 5 MG tablet TAKE 1 TABLET (5 MG TOTAL) BY MOUTH DAILY. 90 tablet 3   aspirin  EC 81 MG EC tablet Take 1 tablet (81 mg total) by mouth daily.     carvedilol  (COREG ) 12.5 MG tablet Take 1 tablet (12.5 mg total) by mouth 2 (two) times daily. 180 tablet 3   ezetimibe  (ZETIA ) 10 MG tablet TAKE 1 TABLET BY MOUTH EVERY DAY 90 tablet 1   hydrochlorothiazide  (HYDRODIURIL ) 25 MG tablet TAKE 1 TABLET BY MOUTH EVERY DAY FOR BLOOD PRESSURE 90 tablet 1   potassium chloride  (KLOR-CON  M) 10 MEQ tablet TAKE 1 TABLET BY MOUTH EVERY DAY 90 tablet 3   rosuvastatin  (CRESTOR ) 20 MG tablet TAKE 1 TABLET BY MOUTH DAILY. FOR CHOLESTEROL 90 tablet 3   No current facility-administered medications on file prior to visit.    Allergies  Allergen Reactions   Penicillins Hives and Diarrhea    Did it involve swelling of the face/tongue/throat, SOB, or low BP? No Did it involve sudden or severe rash/hives, skin peeling, or any reaction on the inside of your mouth or nose? No Did you need to seek medical attention at a hospital or doctor's office? No When did it last happen?  less than 10 years      If all above answers are "NO", may proceed with cephalosporin use.     Past Medical History:  Diagnosis Date    AAA (abdominal aortic aneurysm) (HCC)    Ectopic pregnancy    HTN (hypertension)    Hypertensive emergency    Thoracic aortic aneurysm (HCC) 02/2019   Tubal pregnancy     Past Surgical History:  Procedure Laterality Date   BREAST SURGERY     ECTOPIC PREGNANCY SURGERY     PARTIAL HYSTERECTOMY     THORACIC AORTIC ENDOVASCULAR STENT GRAFT N/A 02/22/2019   Procedure: THORACIC AORTIC ENDOVASCULAR STENT GRAFT;  Surgeon: Harvey Carlin BRAVO, MD;  Location[facility];  Service: Vascular;  Laterality: N/A;   ULTRASOUND GUIDANCE FOR VASCULAR ACCESS Right 02/22/2019   Procedure: Ultrasound Guidance For Vascular Access;  Surgeon: Harvey Carlin BRAVO, MD;  Location: [facility] OR;  Service: Vascular;  Laterality: Right;    Family History  Problem Relation Age of Onset   Cancer Mother        Peritoneal carcinoma    Heart disease Mother    Uterine cancer Mother    Hypothyroidism Mother    Alzheimer's disease Father    Stroke Father    Hypothyroidism Sister    Thyroid  cancer Sister    Hypothyroidism Sister    Hypothyroidism Sister    Arthritis Brother     Social History   Socioeconomic History   Marital status: Married    Spouse name: Not on file  Number of children: Not on file   Years of education: Not on file   Highest education level: 12th grade  Occupational History   Not on file  Tobacco Use   Smoking status: Former    Current packs/day: 0.00    Average packs/day: 1 pack/day for 20.0 years (20.0 ttl pk-yrs)    Types: Cigarettes    Start date: 01/25/1999    Quit date: 01/25/2019    Years since quitting: 5.1   Smokeless tobacco: Never  Vaping Use   Vaping status: Never Used  Substance and Sexual Activity   Alcohol use: No   Drug use: Never   Sexual activity: Not on file  Other Topics Concern   Not on file  Social History Narrative   Not on file   Social Drivers of Health   Financial Resource Strain: Low Risk  (03/15/2024)   Overall Financial Resource Strain (CARDIA)    Difficulty  of Paying Living Expenses: Not hard at all  Food Insecurity: No Food Insecurity (03/15/2024)   Hunger Vital Sign    Worried About Running Out of Food in the Last Year: Never true    Ran Out of Food in the Last Year: Never true  Transportation Needs: No Transportation Needs (03/15/2024)   PRAPARE - Administrator, Civil Service (Medical): No    Lack of Transportation (Non-Medical): No  Physical Activity: Insufficiently Active (03/15/2024)   Exercise Vital Sign    Days of Exercise per Week: 1 day    Minutes of Exercise per Session: 10 min  Stress: No Stress Concern Present (03/15/2024)   [employer] of Occupational Health - Occupational Stress Questionnaire    Feeling of Stress: Not at all  Social Connections: Socially Integrated (03/15/2024)   Social Connection and Isolation Panel    Frequency of Communication with Friends and Family: More than three times a week    Frequency of Social Gatherings with Friends and Family: More than three times a week    Attends Religious Services: More than 4 times per year    Active Member of [employer] or Organizations: Yes    Attends Engineer, structural: More than 4 times per year    Marital Status: Married  Catering manager Violence: Not on file   Review of Systems No history of kidney stones     Objective:   Physical Exam Constitutional:      Appearance: Normal appearance.  Abdominal:     Tenderness: There is no left CVA tenderness.     Comments: Right CVA tenderness Fairly generalized abdominal tenderness---especially RUQ (but Murphy's sign absent). RLL at first---but then able to deeply palpate---and LLQ   Neurological:     Mental Status: She is alert.            Assessment & Plan:

## 2024-03-18 LAB — URINE CULTURE
MICRO NUMBER:: 16640700
SPECIMEN QUALITY:: ADEQUATE

## 2024-05-20 ENCOUNTER — Other Ambulatory Visit: Payer: Self-pay | Admitting: Cardiovascular Disease

## 2024-05-20 DIAGNOSIS — I10 Essential (primary) hypertension: Secondary | ICD-10-CM

## 2024-05-20 MED ORDER — HYDROCHLOROTHIAZIDE 25 MG PO TABS: 25.0000 mg | ORAL_TABLET | Freq: Every day | ORAL | 1 refills | Status: AC

## 2024-06-10 ENCOUNTER — Ambulatory Visit: Admitting: Primary Care

## 2024-06-10 ENCOUNTER — Ambulatory Visit: Payer: Self-pay | Admitting: Primary Care

## 2024-06-10 ENCOUNTER — Encounter: Payer: Self-pay | Admitting: Primary Care

## 2024-06-10 VITALS — BP 118/68 | HR 58 | Temp 97.9°F | Ht 65.5 in | Wt 141.0 lb

## 2024-06-10 DIAGNOSIS — E785 Hyperlipidemia, unspecified: Secondary | ICD-10-CM

## 2024-06-10 DIAGNOSIS — R7303 Prediabetes: Secondary | ICD-10-CM | POA: Diagnosis not present

## 2024-06-10 DIAGNOSIS — Z0000 Encounter for general adult medical examination without abnormal findings: Secondary | ICD-10-CM

## 2024-06-10 DIAGNOSIS — I10 Essential (primary) hypertension: Secondary | ICD-10-CM | POA: Diagnosis not present

## 2024-06-10 DIAGNOSIS — I711 Thoracic aortic aneurysm, ruptured, unspecified: Secondary | ICD-10-CM

## 2024-06-10 DIAGNOSIS — Z23 Encounter for immunization: Secondary | ICD-10-CM | POA: Diagnosis not present

## 2024-06-10 DIAGNOSIS — N289 Disorder of kidney and ureter, unspecified: Secondary | ICD-10-CM

## 2024-06-10 DIAGNOSIS — R921 Mammographic calcification found on diagnostic imaging of breast: Secondary | ICD-10-CM

## 2024-06-10 DIAGNOSIS — Z1231 Encounter for screening mammogram for malignant neoplasm of breast: Secondary | ICD-10-CM

## 2024-06-10 LAB — COMPREHENSIVE METABOLIC PANEL WITH GFR
ALT: 18 U/L (ref 0–35)
AST: 27 U/L (ref 0–37)
Albumin: 3.9 g/dL (ref 3.5–5.2)
Alkaline Phosphatase: 59 U/L (ref 39–117)
BUN: 25 mg/dL — ABNORMAL HIGH (ref 6–23)
CO2: 30 meq/L (ref 19–32)
Calcium: 9.3 mg/dL (ref 8.4–10.5)
Chloride: 96 meq/L (ref 96–112)
Creatinine, Ser: 1.4 mg/dL — ABNORMAL HIGH (ref 0.40–1.20)
GFR: 39.76 mL/min — ABNORMAL LOW (ref >60.00)
Glucose, Bld: 105 mg/dL — ABNORMAL HIGH (ref 70–99)
Potassium: 3.5 meq/L (ref 3.5–5.1)
Sodium: 134 meq/L — ABNORMAL LOW (ref 135–145)
Total Bilirubin: 0.4 mg/dL (ref 0.2–1.2)
Total Protein: 6.8 g/dL (ref 6.0–8.3)

## 2024-06-10 LAB — CBC
HCT: 38.7 % (ref 36.0–46.0)
Hemoglobin: 13.2 g/dL (ref 12.0–15.0)
MCHC: 34 g/dL (ref 30.0–36.0)
MCV: 88.3 fl (ref 78.0–100.0)
Platelets: 225 K/uL (ref 150.0–400.0)
RBC: 4.38 Mil/uL (ref 3.87–5.11)
RDW: 13.7 % (ref 11.5–15.5)
WBC: 6.7 K/uL (ref 4.0–10.5)

## 2024-06-10 LAB — LIPID PANEL
Cholesterol: 118 mg/dL (ref 0–200)
HDL: 46.7 mg/dL (ref >39.00)
LDL Cholesterol: 55 mg/dL (ref 0–99)
NonHDL: 71.13
Total CHOL/HDL Ratio: 3
Triglycerides: 82 mg/dL (ref 0.0–149.0)
VLDL: 16.4 mg/dL (ref 0.0–40.0)

## 2024-06-10 LAB — HEMOGLOBIN A1C: Hgb A1c MFr Bld: 6.2 % (ref 4.6–6.5)

## 2024-06-10 NOTE — Assessment & Plan Note (Signed)
 Stable x 2 years.  Reviewed mammogram from November 2024. Screening mammogram ordered and pending per radiology recommendation.

## 2024-06-10 NOTE — Assessment & Plan Note (Signed)
 Immunizations UTD. Influenza vaccine provided today.  Pap smear due, she declines today Mammogram due in November, orders placed. Colon cancer screening UTD, due in 2027  Discussed the importance of a healthy diet and regular exercise in order for weight loss, and to reduce the risk of further co-morbidity.  Exam stable. Labs pending.  Follow up in 1 year for repeat physical.

## 2024-06-10 NOTE — Assessment & Plan Note (Signed)
 Repeat A1C pending.

## 2024-06-10 NOTE — Assessment & Plan Note (Signed)
 Stable.  Following with cardiology and vascular services. Office notes and CTA reviewed from November 2024.

## 2024-06-10 NOTE — Assessment & Plan Note (Addendum)
 Controlled.  Continue carvedilol  12.5 mg BID, amlodipine  5 mg daily, hydrochlorothiazide  25 mg daily, potassium chloride  20 mEq daily. CMP pending.

## 2024-06-10 NOTE — Assessment & Plan Note (Signed)
 Repeat lipid panel pending.  Continue rosuvastatin  20 mg daily, Zetia  10 mg daily.

## 2024-06-10 NOTE — Progress Notes (Signed)
 Subjective:    Patient ID: CATALINA SALASAR, female    DOB: [birth year], 63 y.o.   MRN: [id number]  LORIANN BOSSERMAN is a very pleasant 63 y.o. female who presents today for complete physical and follow up of chronic conditions.  Immunizations: -Tetanus: Completed in 2024 -Influenza: Influenza vaccine provided today.  -Shingles: Completed Shingrix  series -Pneumonia: Completed in 2020   Diet: Fair diet.  Exercise: No regular exercise.  Eye exam: Completes annually  Dental exam: Completes semi-annually    Pap Smear: Completed in September 2022, declines today Mammogram: Completed in November 2024   Colonoscopy: Completed Cologuard in 2024, negative   BP Readings from Last 3 Encounters:  06/10/24 118/68  03/15/24 110/64  09/23/23 110/80      Review of Systems  Constitutional:  Negative for unexpected weight change.  HENT:  Negative for rhinorrhea.   Respiratory:  Negative for cough and shortness of breath.   Cardiovascular:  Negative for chest pain.  Gastrointestinal:  Negative for constipation and diarrhea.  Genitourinary:  Negative for difficulty urinating.  Musculoskeletal:  Negative for arthralgias and myalgias.  Skin:  Negative for rash.  Allergic/Immunologic: Negative for environmental allergies.  Neurological:  Negative for dizziness and headaches.  Psychiatric/Behavioral:  The patient is not nervous/anxious.          Past Medical History:  Diagnosis Date   AAA (abdominal aortic aneurysm)    Allergy    Arthritis    Ectopic pregnancy    HTN (hypertension)    Hyperlipidemia    Hypertensive emergency    Osteoporosis    Thoracic aortic aneurysm 02/2019   Tubal pregnancy     Social History   Socioeconomic History   Marital status: Married    Spouse name: Not on file   Number of children: Not on file   Years of education: Not on file   Highest education level: 12th grade  Occupational History   Not on file  Tobacco Use   Smoking status:  Former    Current packs/day: 0.00    Average packs/day: 1 pack/day for 20.0 years (20.0 ttl pk-yrs)    Types: Cigarettes    Start date: 01/25/1999    Quit date: 01/25/2019    Years since quitting: 5.3   Smokeless tobacco: Never  Vaping Use   Vaping status: Never Used  Substance and Sexual Activity   Alcohol use: No   Drug use: Never   Sexual activity: Yes  Other Topics Concern   Not on file  Social History Narrative   Not on file   Social Drivers of Health   Financial Resource Strain: Low Risk  (03/15/2024)   Overall Financial Resource Strain (CARDIA)    Difficulty of Paying Living Expenses: Not hard at all  Food Insecurity: No Food Insecurity (03/15/2024)   Hunger Vital Sign    Worried About Running Out of Food in the Last Year: Never true    Ran Out of Food in the Last Year: Never true  Transportation Needs: No Transportation Needs (03/15/2024)   PRAPARE - Administrator, Civil Service (Medical): No    Lack of Transportation (Non-Medical): No  Physical Activity: Insufficiently Active (03/15/2024)   Exercise Vital Sign    Days of Exercise per Week: 1 day    Minutes of Exercise per Session: 10 min  Stress: No Stress Concern Present (03/15/2024)   [employer] of Occupational Health - Occupational Stress Questionnaire    Feeling of Stress: Not at all  Social Connections: Socially Integrated (03/15/2024)   Social Connection and Isolation Panel    Frequency of Communication with Friends and Family: More than three times a week    Frequency of Social Gatherings with Friends and Family: More than three times a week    Attends Religious Services: More than 4 times per year    Active Member of Clubs or Organizations: Yes    Attends Engineer, structural: More than 4 times per year    Marital Status: Married  Catering manager Violence: Not on file    Past Surgical History:  Procedure Laterality Date   BREAST SURGERY     ECTOPIC PREGNANCY SURGERY      PARTIAL HYSTERECTOMY     THORACIC AORTIC ENDOVASCULAR STENT GRAFT N/A 02/22/2019   Procedure: THORACIC AORTIC ENDOVASCULAR STENT GRAFT;  Surgeon: Harvey Carlin BRAVO, MD;  Location: [facility] OR;  Service: Vascular;  Laterality: N/A;   ULTRASOUND GUIDANCE FOR VASCULAR ACCESS Right 02/22/2019   Procedure: Ultrasound Guidance For Vascular Access;  Surgeon: Harvey Carlin BRAVO, MD;  Location: [facility] OR;  Service: Vascular;  Laterality: Right;    Family History  Problem Relation Age of Onset   Cancer Mother        Peritoneal carcinoma    Heart disease Mother    Uterine cancer Mother    Hypothyroidism Mother    Miscarriages / [place] Mother    Alzheimer's disease Father    Stroke Father    Hypothyroidism Sister    Thyroid  cancer Sister    Hypothyroidism Sister    Miscarriages / Stillbirths Sister    Hypothyroidism Sister    Arthritis Brother    Arthritis Brother     Allergies  Allergen Reactions   Penicillins Hives and Diarrhea    Did it involve swelling of the face/tongue/throat, SOB, or low BP? No Did it involve sudden or severe rash/hives, skin peeling, or any reaction on the inside of your mouth or nose? No Did you need to seek medical attention at a hospital or doctor's office? No When did it last happen?  less than 10 years      If all above answers are "NO", may proceed with cephalosporin use.     Current Outpatient Medications on File Prior to Visit  Medication Sig Dispense Refill   acetaminophen  (TYLENOL ) 500 MG tablet Take 1,000 mg by mouth every 6 (six) hours as needed for moderate pain or headache.     amLODipine  (NORVASC ) 5 MG tablet TAKE 1 TABLET (5 MG TOTAL) BY MOUTH DAILY. 90 tablet 3   aspirin  EC 81 MG EC tablet Take 1 tablet (81 mg total) by mouth daily.     carvedilol  (COREG ) 12.5 MG tablet Take 1 tablet (12.5 mg total) by mouth 2 (two) times daily. 180 tablet 3   ezetimibe  (ZETIA ) 10 MG tablet TAKE 1 TABLET BY MOUTH EVERY DAY 90 tablet 1   hydrochlorothiazide  (HYDRODIURIL )  25 MG tablet Take 1 tablet (25 mg total) by mouth daily. 90 tablet 1   potassium chloride  (KLOR-CON  M) 10 MEQ tablet TAKE 1 TABLET BY MOUTH EVERY DAY 90 tablet 3   rosuvastatin  (CRESTOR ) 20 MG tablet TAKE 1 TABLET BY MOUTH DAILY. FOR CHOLESTEROL 90 tablet 3   No current facility-administered medications on file prior to visit.    BP 118/68   Pulse (!) 58   Temp 97.9 F (36.6 C) (Oral)   Ht 5' 5.5 (1.664 m)   Wt 141 lb (64 kg)   SpO2 99%  BMI 23.11 kg/m  Objective:   Physical Exam HENT:     Right Ear: Tympanic membrane and ear canal normal.     Left Ear: Tympanic membrane and ear canal normal.  Eyes:     Pupils: Pupils are equal, round, and reactive to light.  Cardiovascular:     Rate and Rhythm: Normal rate and regular rhythm.  Pulmonary:     Effort: Pulmonary effort is normal.     Breath sounds: Normal breath sounds.  Abdominal:     General: Bowel sounds are normal.     Palpations: Abdomen is soft.     Tenderness: There is no abdominal tenderness.  Musculoskeletal:        General: Normal range of motion.     Cervical back: Neck supple.  Skin:    General: Skin is warm and dry.  Neurological:     Mental Status: She is alert and oriented to person, place, and time.     Cranial Nerves: No cranial nerve deficit.     Deep Tendon Reflexes:     Reflex Scores:      Patellar reflexes are 2+ on the right side and 2+ on the left side. Psychiatric:        Mood and Affect: Mood normal.     Physical Exam        Assessment & Plan:  Preventative health care Assessment & Plan: Immunizations UTD. Influenza vaccine provided today.  Pap smear due, she declines today Mammogram due in November, orders placed. Colon cancer screening UTD, due in 2027  Discussed the importance of a healthy diet and regular exercise in order for weight loss, and to reduce the risk of further co-morbidity.  Exam stable. Labs pending.  Follow up in 1 year for repeat physical.    Need for  influenza vaccination -     Flu vaccine trivalent PF, 6mos and older(Flulaval,Afluria,Fluarix,Fluzone)  Ruptured aneurysm of thoracic aorta, unspecified part (HCC) Assessment & Plan: Stable.  Following with cardiology and vascular services. Office notes and CTA reviewed from November 2024.   Essential (primary) hypertension Assessment & Plan: Controlled.  Continue carvedilol  12.5 mg BID, amlodipine  5 mg daily, hydrochlorothiazide  25 mg daily, potassium chloride  20 mEq daily. CMP pending.  Orders: -     CBC  Prediabetes Assessment & Plan: Repeat A1C pending.   Orders: -     Hemoglobin A1c  Hyperlipidemia, unspecified hyperlipidemia type Assessment & Plan: Repeat lipid panel pending.  Continue rosuvastatin  20 mg daily, Zetia  10 mg daily.  Orders: -     Lipid panel -     Comprehensive metabolic panel with GFR -     CBC  Breast calcifications on mammogram Assessment & Plan: Stable x 2 years.  Reviewed mammogram from November 2024. Screening mammogram ordered and pending per radiology recommendation.   Screening mammogram for breast cancer -     3D Screening Mammogram, Left and Right; Future    Assessment and Plan Assessment & Plan         Comer MARLA Gaskins, NP   History of Present Illness

## 2024-06-10 NOTE — Patient Instructions (Signed)
 Stop by the lab prior to leaving today. I will notify you of your results once received.   [facility] to schedule your mammogram.   It was a pleasure to see you today!

## 2024-06-11 ENCOUNTER — Other Ambulatory Visit (INDEPENDENT_AMBULATORY_CARE_PROVIDER_SITE_OTHER)

## 2024-06-11 DIAGNOSIS — N289 Disorder of kidney and ureter, unspecified: Secondary | ICD-10-CM | POA: Diagnosis not present

## 2024-06-11 LAB — POC URINALSYSI DIPSTICK (AUTOMATED)
Bilirubin, UA: NEGATIVE
Blood, UA: POSITIVE
Glucose, UA: NEGATIVE
Ketones, UA: NEGATIVE
Nitrite, UA: NEGATIVE
Protein, UA: NEGATIVE
Spec Grav, UA: <=1.005 — AB (ref 1.010–1.025)
Urobilinogen, UA: 0.2 U/dL
pH, UA: 6 (ref 5.0–8.0)

## 2024-06-13 ENCOUNTER — Ambulatory Visit: Payer: Self-pay | Admitting: Primary Care

## 2024-06-13 DIAGNOSIS — N3001 Acute cystitis with hematuria: Secondary | ICD-10-CM

## 2024-06-13 LAB — URINE CULTURE
MICRO NUMBER:: 17022877
SPECIMEN QUALITY:: ADEQUATE

## 2024-06-13 MED ORDER — SULFAMETHOXAZOLE-TRIMETHOPRIM 800-160 MG PO TABS: 1.0000 | ORAL_TABLET | Freq: Two times a day (BID) | ORAL | 0 refills | Status: DC

## 2024-07-19 ENCOUNTER — Other Ambulatory Visit: Payer: Self-pay

## 2024-07-19 DIAGNOSIS — Z95828 Presence of other vascular implants and grafts: Secondary | ICD-10-CM

## 2024-07-28 ENCOUNTER — Ambulatory Visit

## 2024-08-05 ENCOUNTER — Inpatient Hospital Stay
Admission: RE | Admit: 2024-08-05 | Discharge: 2024-08-05 | Disposition: A | Source: Ambulatory Visit | Attending: Vascular Surgery

## 2024-08-05 DIAGNOSIS — Z95828 Presence of other vascular implants and grafts: Secondary | ICD-10-CM

## 2024-08-05 MED ORDER — IOPAMIDOL (ISOVUE-370) INJECTION 76%
75.0000 mL | Freq: Once | INTRAVENOUS | Status: AC | PRN
  Administered 2024-08-05: 75 mL via INTRAVENOUS

## 2024-08-14 ENCOUNTER — Other Ambulatory Visit: Payer: Self-pay | Admitting: Cardiovascular Disease

## 2024-08-16 NOTE — Progress Notes (Unsigned)
 [facility] [place]  ASSESSMENT / PLAN: 63 y.o. 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 Harvey.  Overall, the repair appears good on most recent CT angiogram.  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  Follow-up with me in 1 year with repeat CT angiogram of the chest, abdomen, and pelvis.  CHIEF COMPLAINT: Surveillance after type B aortic dissection  HISTORY OF PRESENT ILLNESS: Heather Pollard is a 63 y.o. female who presents to clinic for surveillance of type B aortic dissection.  This was initially diagnosed in 2020.  The patient was initiated on medical therapy, but had propagation of her aortic dissection.  This prompted TEVAR by Dr. Harvey February 22, 2019.  She did well from this.  She initially had difficulty managing her blood pressure, and was often hypotensive making her sluggish.  Her blood pressure regimen has been tailored and she tolerates Coreg  and hydrochlorothiazide  quite well.  She has many excellent questions about surveillance for type B aortic dissection, we spent the majority of the clinic visit discussing the rationale for surveillance and worrisome symptoms to look out for.  07/16/22: Doing well overall.  No new symptoms to report.  She has been active.  She arrives with her husband and many excellent questions about her most recent CT scan.  We reviewed these in detail.  I counseled her about the small aneurysm about her descending thoracic/supraceliac aorta.  07/22/23: Doing great. She comes with great questions as usual. We reviewed her CT scan in detail. She is very active (swimming, yard work, walking). She is very healthy.   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 Harvey  VASCULAR RISK  FACTORS: Negative history of stroke / transient ischemic attack. Negative history of coronary artery disease.  Negative history of diabetes mellitus.  Positive history of smoking. Not actively smoking. Positive history of hypertension.  Negative history of chronic kidney disease.  Negative history of chronic obstructive pulmonary disease.  FUNCTIONAL STATUS: ECOG performance status: (0) Fully active, able to carry on all predisease performance without restriction Ambulatory status: Ambulatory within the community without limits  Past Medical History:  Diagnosis Date   AAA (abdominal aortic aneurysm)    Allergy    Arthritis    Ectopic pregnancy    HTN (hypertension)    Hyperlipidemia    Hypertensive emergency    Osteoporosis    Thoracic aortic aneurysm 02/2019   Tubal pregnancy     Past Surgical History:  Procedure Laterality Date   BREAST SURGERY     ECTOPIC PREGNANCY SURGERY     PARTIAL HYSTERECTOMY     THORACIC AORTIC ENDOVASCULAR STENT GRAFT N/A 02/22/2019   Procedure: THORACIC AORTIC ENDOVASCULAR STENT GRAFT;  Surgeon: Harvey Carlin BRAVO, MD;  Location: [facility] OR;  Service: Vascular;  Laterality: N/A;   ULTRASOUND GUIDANCE FOR VASCULAR ACCESS Right 02/22/2019   Procedure: Ultrasound Guidance For Vascular Access;  Surgeon: Harvey Carlin BRAVO, MD;  Location: [facility] OR;  Service: Vascular;  Laterality: Right;    Family History  Problem Relation Age of Onset   Cancer Mother        Peritoneal carcinoma    Heart disease Mother    Uterine cancer Mother  Hypothyroidism Mother    Miscarriages / Stillbirths Mother    Alzheimer's disease Father    Stroke Father    Hypothyroidism Sister    Thyroid  cancer Sister    Hypothyroidism Sister    Miscarriages / Stillbirths Sister    Hypothyroidism Sister    Arthritis Brother    Arthritis Brother     Social History   Socioeconomic History   Marital status: Married    Spouse name: Not on file   Number of children: Not on file    Years of education: Not on file   Highest education level: 12th grade  Occupational History   Not on file  Tobacco Use   Smoking status: Former    Current packs/day: 0.00    Average packs/day: 1 pack/day for 20.0 years (20.0 ttl pk-yrs)    Types: Cigarettes    Start date: 01/25/1999    Quit date: 01/25/2019    Years since quitting: 5.5   Smokeless tobacco: Never  Vaping Use   Vaping status: Never Used  Substance and Sexual Activity   Alcohol use: No   Drug use: Never   Sexual activity: Yes  Other Topics Concern   Not on file  Social History Narrative   Not on file   Social Drivers of Health   Financial Resource Strain: Low Risk  (03/15/2024)   Overall Financial Resource Strain (CARDIA)    Difficulty of Paying Living Expenses: Not hard at all  Food Insecurity: No Food Insecurity (03/15/2024)   Hunger Vital Sign    Worried About Running Out of Food in the Last Year: Never true    Ran Out of Food in the Last Year: Never true  Transportation Needs: No Transportation Needs (03/15/2024)   PRAPARE - Administrator, Civil Service (Medical): No    Lack of Transportation (Non-Medical): No  Physical Activity: Insufficiently Active (03/15/2024)   Exercise Vital Sign    Days of Exercise per Week: 1 day    Minutes of Exercise per Session: 10 min  Stress: No Stress Concern Present (03/15/2024)   [employer] of Occupational Health - Occupational Stress Questionnaire    Feeling of Stress: Not at all  Social Connections: Socially Integrated (03/15/2024)   Social Connection and Isolation Panel    Frequency of Communication with Friends and Family: More than three times a week    Frequency of Social Gatherings with Friends and Family: More than three times a week    Attends Religious Services: More than 4 times per year    Active Member of [employer] or Organizations: Yes    Attends Engineer, Structural: More than 4 times per year    Marital Status: Married  Careers Information Officer Violence: Not on file    Allergies  Allergen Reactions   Penicillins Hives and Diarrhea    Did it involve swelling of the face/tongue/throat, SOB, or low BP? No Did it involve sudden or severe rash/hives, skin peeling, or any reaction on the inside of your mouth or nose? No Did you need to seek medical attention at a hospital or doctor's office? No When did it last happen?  less than 10 years      If all above answers are "NO", may proceed with cephalosporin use.     Current Outpatient Medications  Medication Sig Dispense Refill   acetaminophen  (TYLENOL ) 500 MG tablet Take 1,000 mg by mouth every 6 (six) hours as needed for moderate pain or headache.  amLODipine  (NORVASC ) 5 MG tablet TAKE 1 TABLET (5 MG TOTAL) BY MOUTH DAILY. 90 tablet 3   aspirin  EC 81 MG EC tablet Take 1 tablet (81 mg total) by mouth daily.     carvedilol  (COREG ) 12.5 MG tablet Take 1 tablet (12.5 mg total) by mouth 2 (two) times daily. 180 tablet 3   ezetimibe  (ZETIA ) 10 MG tablet TAKE 1 TABLET BY MOUTH EVERY DAY 90 tablet 1   hydrochlorothiazide  (HYDRODIURIL ) 25 MG tablet Take 1 tablet (25 mg total) by mouth daily. 90 tablet 1   potassium chloride  (KLOR-CON  M) 10 MEQ tablet TAKE 1 TABLET BY MOUTH EVERY DAY 90 tablet 3   rosuvastatin  (CRESTOR ) 20 MG tablet TAKE 1 TABLET BY MOUTH DAILY. FOR CHOLESTEROL 90 tablet 3   sulfamethoxazole -trimethoprim  (BACTRIM  DS) 800-160 MG tablet Take 1 tablet by mouth 2 (two) times daily. For urinary tract infection. 10 tablet 0   No current facility-administered medications for this visit.    PHYSICAL EXAM There were no vitals filed for this visit.   Constitutional: well appearing. no distress. Appears well nourished.  Neurologic: CN intact. no focal findings. no sensory loss. Psychiatric:  Mood and affect symmetric and appropriate. Eyes:  No icterus. No conjunctival pallor. Ears, nose, throat:  mucous membranes moist. Midline trachea.  Cardiac: regular rate and  rhythm.  Respiratory:  unlabored. Abdominal:  soft, non-tender, non-distended.  Peripheral vascular: 2+ DP pulses bilaterally Extremity: Trace edema bilateral lower extremities. No cyanosis. No pallor.  Skin: No gangrene. No ulceration.  Lymphatic: No Stemmer's sign. No palpable lymphadenopathy.  PERTINENT LABORATORY AND RADIOLOGIC DATA  Most recent CBC    Latest Ref Rng & Units 06/10/2024    7:35 AM 06/07/2022   10:20 AM 08/27/2021    9:01 PM  CBC  WBC 4.0 - 10.5 K/uL 6.7  6.3    Hemoglobin 12.0 - 15.0 g/dL 86.7  85.7  86.6   Hematocrit 36.0 - 46.0 % 38.7  42.0  39.0   Platelets 150.0 - 400.0 K/uL 225.0  220.0       Most recent CMP    Latest Ref Rng & Units 06/10/2024    7:35 AM 06/10/2023   10:07 AM 03/28/2023    9:39 AM  CMP  Glucose 70 - 99 mg/dL 894  898  98   BUN 6 - 23 mg/dL 25  20  15    Creatinine 0.40 - 1.20 mg/dL 8.59  8.77  8.77   Sodium 135 - 145 mEq/L 134  133  134   Potassium 3.5 - 5.1 mEq/L 3.5  3.5  3.6   Chloride 96 - 112 mEq/L 96  95  95   CO2 19 - 32 mEq/L 30  31  25    Calcium  8.4 - 10.5 mg/dL 9.3  9.4  9.2   Total Protein 6.0 - 8.3 g/dL 6.8  7.1  7.1   Total Bilirubin 0.2 - 1.2 mg/dL 0.4  0.6  0.4   Alkaline Phos 39 - 117 U/L 59  60  79   AST 0 - 37 U/L 27  28  22    ALT 0 - 35 U/L 18  22  16      Renal function CrCl cannot be calculated (Patient's most recent lab result is older than the maximum 21 days allowed.).  Hgb A1c MFr Bld (%)  Date Value  06/10/2024 6.2    LDL Chol Calc (NIH)  Date Value Ref Range Status  03/28/2023 94 0 - 99 mg/dL Final  LDL Cholesterol  Date Value Ref Range Status  06/10/2024 55 0 - 99 mg/dL Final     Vascular Imaging: CT angiogram essentially unchanged. Good technical result from TEVAR. Successful remodeling of aorta. Paravisceral aorta is essentially unchanged. All abdominal branches coming off true lumen. Max diameter ~71mm.  Debby SAILOR. Magda, MD [facility] Phone  Number: [phone] 08/16/2024 11:36 AM  Total time spent on preparing this encounter including chart review, data review, collecting history, examining the patient, coordinating care for this established patient, 30 minutes.  Portions of this report may have been transcribed using voice recognition software.  Every effort has been made to ensure accuracy; however, inadvertent computerized transcription errors may still be present.

## 2024-08-17 ENCOUNTER — Ambulatory Visit: Attending: Vascular Surgery | Admitting: Vascular Surgery

## 2024-08-17 ENCOUNTER — Encounter: Payer: Self-pay | Admitting: Vascular Surgery

## 2024-08-17 VITALS — BP 135/71 | HR 55 | Temp 98.0°F | Ht 65.5 in | Wt 142.0 lb

## 2024-08-17 DIAGNOSIS — Z8679 Personal history of other diseases of the circulatory system: Secondary | ICD-10-CM | POA: Diagnosis not present

## 2024-08-17 DIAGNOSIS — Z95828 Presence of other vascular implants and grafts: Secondary | ICD-10-CM

## 2024-08-17 DIAGNOSIS — Z9889 Other specified postprocedural states: Secondary | ICD-10-CM | POA: Diagnosis not present

## 2024-08-17 DIAGNOSIS — I71019 Dissection of thoracic aorta, unspecified: Secondary | ICD-10-CM

## 2024-08-20 ENCOUNTER — Other Ambulatory Visit: Payer: Self-pay | Admitting: Cardiovascular Disease

## 2024-08-24 ENCOUNTER — Ambulatory Visit: Admitting: Primary Care

## 2024-08-24 ENCOUNTER — Encounter: Payer: Self-pay | Admitting: Primary Care

## 2024-08-24 VITALS — BP 108/64 | HR 67 | Temp 97.7°F | Ht 65.5 in | Wt 141.5 lb

## 2024-08-24 DIAGNOSIS — R1024 Suprapubic pain: Secondary | ICD-10-CM | POA: Insufficient documentation

## 2024-08-24 LAB — POC URINALSYSI DIPSTICK (AUTOMATED)
Bilirubin, UA: NEGATIVE
Glucose, UA: NEGATIVE
Ketones, UA: NEGATIVE
Nitrite, UA: POSITIVE
Protein, UA: POSITIVE — AB
Spec Grav, UA: 1.01 (ref 1.010–1.025)
Urobilinogen, UA: 0.2 U/dL
pH, UA: 6 (ref 5.0–8.0)

## 2024-08-24 MED ORDER — NITROFURANTOIN MONOHYD MACRO 100 MG PO CAPS: 100.0000 mg | ORAL_CAPSULE | Freq: Two times a day (BID) | ORAL | 0 refills | Status: AC

## 2024-08-24 NOTE — Progress Notes (Addendum)
 Subjective:    Patient ID: Heather Pollard, female    DOB: 09/20/1960, 63 y.o.   MRN: [id number]  Heather Pollard is a very pleasant 63 y.o. female with a history of aortic dissection, hypertension, rheumatoid arthritis, hyperlipidemia, prediabetes, cystitis who presents today to discuss abdominal pain.  Symptom onset 3 days ago with suprapubic pain. She then developed lower back pain, decreased appetite. weakness, headaches, nausea with vomiting.  She took AZO last night without improvement. She's tried laying on a heating pad and Tylenol  with little improvement.   She's staying hydrated with water.  She has had two UTIs within the last 6 months.  06/11/2024: Culture positive, E. coli, greater than 100,000 colonies without resistance.  Treated with Bactrim  DS tablets and experienced significant nausea.   03/15/2024: Culture positive, E. coli, greater than 100,000 colonies without resistance, treated with cephalexin .   Review of Systems  Constitutional:  Positive for appetite change and fatigue.  Genitourinary:  Positive for pelvic pain and urgency. Negative for dysuria, flank pain and hematuria.  Musculoskeletal:  Positive for back pain.         Past Medical History:  Diagnosis Date   AAA (abdominal aortic aneurysm)    Allergy    Arthritis    Ectopic pregnancy    HTN (hypertension)    Hyperlipidemia    Hypertensive emergency    Osteoporosis    Thoracic aortic aneurysm 02/2019   Tubal pregnancy     Social History   Socioeconomic History   Marital status: Married    Spouse name: Not on file   Number of children: Not on file   Years of education: Not on file   Highest education level: 12th grade  Occupational History   Not on file  Tobacco Use   Smoking status: Former    Current packs/day: 0.00    Average packs/day: 1 pack/day for 20.0 years (20.0 ttl pk-yrs)    Types: Cigarettes    Start date: 01/25/1999    Quit date: 01/25/2019    Years since quitting: 5.5    Smokeless tobacco: Never  Vaping Use   Vaping status: Never Used  Substance and Sexual Activity   Alcohol use: No   Drug use: Never   Sexual activity: Yes  Other Topics Concern   Not on file  Social History Narrative   Not on file   Social Drivers of Health   Financial Resource Strain: Low Risk  (08/23/2024)   Overall Financial Resource Strain (CARDIA)    Difficulty of Paying Living Expenses: Not hard at all  Food Insecurity: No Food Insecurity (08/23/2024)   Hunger Vital Sign    Worried About Running Out of Food in the Last Year: Never true    Ran Out of Food in the Last Year: Never true  Transportation Needs: No Transportation Needs (08/23/2024)   PRAPARE - Administrator, Civil Service (Medical): No    Lack of Transportation (Non-Medical): No  Physical Activity: Insufficiently Active (08/23/2024)   Exercise Vital Sign    Days of Exercise per Week: 3 days    Minutes of Exercise per Session: 20 min  Stress: No Stress Concern Present (08/23/2024)   [employer] of Occupational Health - Occupational Stress Questionnaire    Feeling of Stress: Not at all  Social Connections: Moderately Integrated (08/23/2024)   Social Connection and Isolation Panel    Frequency of Communication with Friends and Family: More than three times a week    Frequency  of Social Gatherings with Friends and Family: Twice a week    Attends Religious Services: More than 4 times per year    Active Member of [employer] or Organizations: No    Attends Engineer, Structural: Not on file    Marital Status: Married  Catering Manager Violence: Not on file    Past Surgical History:  Procedure Laterality Date   BREAST SURGERY     ECTOPIC PREGNANCY SURGERY     PARTIAL HYSTERECTOMY     THORACIC AORTIC ENDOVASCULAR STENT GRAFT N/A 02/22/2019   Procedure: THORACIC AORTIC ENDOVASCULAR STENT GRAFT;  Surgeon: Harvey Carlin BRAVO, MD;  Location: [facility] OR;  Service: Vascular;  Laterality: N/A;    ULTRASOUND GUIDANCE FOR VASCULAR ACCESS Right 02/22/2019   Procedure: Ultrasound Guidance For Vascular Access;  Surgeon: Harvey Carlin BRAVO, MD;  Location: [facility] OR;  Service: Vascular;  Laterality: Right;    Family History  Problem Relation Age of Onset   Cancer Mother        Peritoneal carcinoma    Heart disease Mother    Uterine cancer Mother    Hypothyroidism Mother    Miscarriages / Stillbirths Mother    Alzheimer's disease Father    Stroke Father    Hypothyroidism Sister    Thyroid  cancer Sister    Hypothyroidism Sister    Miscarriages / Stillbirths Sister    Hypothyroidism Sister    Arthritis Brother    Arthritis Brother     Allergies  Allergen Reactions   Penicillins Hives and Diarrhea    Did it involve swelling of the face/tongue/throat, SOB, or low BP? No Did it involve sudden or severe rash/hives, skin peeling, or any reaction on the inside of your mouth or nose? No Did you need to seek medical attention at a hospital or doctor's office? No When did it last happen?  less than 10 years      If all above answers are "NO", may proceed with cephalosporin use.     Current Outpatient Medications on File Prior to Visit  Medication Sig Dispense Refill   acetaminophen  (TYLENOL ) 500 MG tablet Take 1,000 mg by mouth every 6 (six) hours as needed for moderate pain or headache.     amLODipine  (NORVASC ) 5 MG tablet TAKE 1 TABLET (5 MG TOTAL) BY MOUTH DAILY. 90 tablet 3   aspirin  EC 81 MG EC tablet Take 1 tablet (81 mg total) by mouth daily.     carvedilol  (COREG ) 12.5 MG tablet TAKE 1 TABLET BY MOUTH 2 TIMES DAILY. 180 tablet 0   ezetimibe  (ZETIA ) 10 MG tablet TAKE 1 TABLET BY MOUTH EVERY DAY 90 tablet 0   hydrochlorothiazide  (HYDRODIURIL ) 25 MG tablet Take 1 tablet (25 mg total) by mouth daily. 90 tablet 1   potassium chloride  (KLOR-CON  M) 10 MEQ tablet TAKE 1 TABLET BY MOUTH EVERY DAY 90 tablet 3   rosuvastatin  (CRESTOR ) 20 MG tablet TAKE 1 TABLET BY MOUTH DAILY. FOR CHOLESTEROL  90 tablet 3   No current facility-administered medications on file prior to visit.    BP 108/64   Pulse 67   Temp 97.7 F (36.5 C) (Oral)   Ht 5' 5.5 (1.664 m)   Wt 141 lb 8 oz (64.2 kg)   SpO2 98%   BMI 23.19 kg/m  Objective:   Physical Exam Cardiovascular:     Rate and Rhythm: Normal rate and regular rhythm.  Pulmonary:     Effort: Pulmonary effort is normal.     Breath sounds:  Normal breath sounds.  Abdominal:     Tenderness: There is no right CVA tenderness or left CVA tenderness.  Musculoskeletal:     Cervical back: Neck supple.  Skin:    General: Skin is warm and dry.  Neurological:     Mental Status: She is alert and oriented to person, place, and time.  Psychiatric:        Mood and Affect: Mood normal.     Physical Exam        Assessment & Plan:  Suprapubic pain Assessment & Plan: Differentials include acute cystitis, pyelonephritis, renal stone.  UA today with 3+ leuks, 2+ blood, positive nitrites. Urine culture ordered and pending.  Given history, will treat for presumed infection. Start Macrobid  (nitrofurantoin ) tablets for urinary tract infection. Take 1 tablet by mouth twice daily for 5 days. Start cranberry pills once daily.   If she continues to experience symptoms despite treatment then we will proceed with CT  Will plan to recheck her urinalysis 2 weeks after she completes her antibiotics.  She will schedule a lab appointment. Consider Estrace vaginal cream if needed.  Orders: -     POCT Urinalysis Dipstick (Automated) -     Urine Culture -     Nitrofurantoin  Monohyd Macro; Take 1 capsule (100 mg total) by mouth 2 (two) times daily for 5 days.  Dispense: 10 capsule; Refill: 0    Assessment and Plan Assessment & Plan         Comer MARLA Gaskins, NP

## 2024-08-24 NOTE — Patient Instructions (Signed)
 Start Macrobid  (nitrofurantoin ) tablets for urinary tract infection. Take 1 tablet by mouth twice daily for 5 days.  Start taking a cranberry pill once daily for UTI prevention.  Schedule a lab only appointment 2 weeks after you finish your antibiotic to retest your urine.  It was a pleasure to see you today!

## 2024-08-24 NOTE — Assessment & Plan Note (Addendum)
 Differentials include acute cystitis, pyelonephritis, renal stone.  UA today with 3+ leuks, 2+ blood, positive nitrites. Urine culture ordered and pending.  Given history, will treat for presumed infection. Start Macrobid  (nitrofurantoin ) tablets for urinary tract infection. Take 1 tablet by mouth twice daily for 5 days. Start cranberry pills once daily.   If she continues to experience symptoms despite treatment then we will proceed with CT  Will plan to recheck her urinalysis 2 weeks after she completes her antibiotics.  She will schedule a lab appointment. Consider Estrace vaginal cream if needed.

## 2024-08-25 ENCOUNTER — Ambulatory Visit

## 2024-08-27 ENCOUNTER — Ambulatory Visit: Payer: Self-pay | Admitting: Primary Care

## 2024-08-27 ENCOUNTER — Ambulatory Visit: Admitting: Primary Care

## 2024-08-27 DIAGNOSIS — N3001 Acute cystitis with hematuria: Secondary | ICD-10-CM

## 2024-08-27 LAB — URINE CULTURE
MICRO NUMBER:: 17332336
SPECIMEN QUALITY:: ADEQUATE

## 2024-09-13 ENCOUNTER — Other Ambulatory Visit

## 2024-09-13 DIAGNOSIS — N3001 Acute cystitis with hematuria: Secondary | ICD-10-CM | POA: Diagnosis not present

## 2024-09-13 LAB — URINALYSIS, ROUTINE W REFLEX MICROSCOPIC
Bilirubin Urine: NEGATIVE
Ketones, ur: NEGATIVE
Nitrite: NEGATIVE
Specific Gravity, Urine: <=1.005 — AB (ref 1.000–1.030)
Total Protein, Urine: NEGATIVE
Urine Glucose: NEGATIVE
Urobilinogen, UA: 0.2 (ref 0.0–1.0)
pH: 6 (ref 5.0–8.0)

## 2024-09-17 ENCOUNTER — Ambulatory Visit: Payer: Self-pay | Admitting: Primary Care

## 2024-09-17 LAB — URINE CULTURE
MICRO NUMBER:: 17404419
SPECIMEN QUALITY:: ADEQUATE

## 2024-09-21 ENCOUNTER — Ambulatory Visit
Admission: RE | Admit: 2024-09-21 | Discharge: 2024-09-21 | Disposition: A | Discharge status: Home / Self Care | Source: Ambulatory Visit | Attending: Primary Care | Admitting: Primary Care

## 2024-09-21 DIAGNOSIS — Z1231 Encounter for screening mammogram for malignant neoplasm of breast: Secondary | ICD-10-CM

## 2024-09-21 DIAGNOSIS — N3090 Cystitis, unspecified without hematuria: Secondary | ICD-10-CM

## 2024-09-21 DIAGNOSIS — N3001 Acute cystitis with hematuria: Secondary | ICD-10-CM

## 2024-09-21 MED ORDER — ESTRADIOL 0.01 % VA CREA: TOPICAL_CREAM | VAGINAL | 0 refills | Status: AC

## 2024-09-21 MED ORDER — SULFAMETHOXAZOLE-TRIMETHOPRIM 800-160 MG PO TABS: 1.0000 | ORAL_TABLET | Freq: Two times a day (BID) | ORAL | 0 refills | Status: AC

## 2024-09-23 ENCOUNTER — Other Ambulatory Visit: Payer: Self-pay | Admitting: Primary Care

## 2024-09-23 ENCOUNTER — Encounter: Payer: Self-pay | Admitting: Cardiovascular Disease

## 2024-09-23 DIAGNOSIS — R928 Other abnormal and inconclusive findings on diagnostic imaging of breast: Secondary | ICD-10-CM

## 2024-10-07 ENCOUNTER — Ambulatory Visit: Payer: Self-pay | Admitting: Primary Care

## 2024-10-07 ENCOUNTER — Ambulatory Visit
Admission: RE | Admit: 2024-10-07 | Discharge: 2024-10-07 | Disposition: A | Discharge status: Home / Self Care | Source: Ambulatory Visit | Attending: Primary Care | Admitting: Primary Care

## 2024-10-07 ENCOUNTER — Other Ambulatory Visit: Payer: Self-pay | Admitting: Primary Care

## 2024-10-07 DIAGNOSIS — R928 Other abnormal and inconclusive findings on diagnostic imaging of breast: Secondary | ICD-10-CM

## 2024-10-18 ENCOUNTER — Encounter

## 2024-10-21 ENCOUNTER — Inpatient Hospital Stay: Admission: RE | Admit: 2024-10-21 | Discharge: 2024-10-21 | Attending: Primary Care

## 2024-10-21 ENCOUNTER — Ambulatory Visit
Admission: RE | Admit: 2024-10-21 | Discharge: 2024-10-21 | Disposition: A | Source: Ambulatory Visit | Attending: Primary Care

## 2024-10-21 DIAGNOSIS — R928 Other abnormal and inconclusive findings on diagnostic imaging of breast: Secondary | ICD-10-CM

## 2024-10-22 LAB — SURGICAL PATHOLOGY

## 2024-11-01 ENCOUNTER — Ambulatory Visit: Admitting: Emergency Medicine

## 2024-11-23 ENCOUNTER — Ambulatory Visit: Admitting: Emergency Medicine
